# Patient Record
Sex: Female | Born: 1960 | Race: White | Hispanic: No | Marital: Married | Smoking: Former smoker
Health system: Southern US, Community
[De-identification: ages and names within clinical notes are randomized; demographics above are authoritative.]

## PROBLEM LIST (undated history)

## (undated) DIAGNOSIS — F32A Depression, unspecified: Secondary | ICD-10-CM

## (undated) DIAGNOSIS — F329 Major depressive disorder, single episode, unspecified: Secondary | ICD-10-CM

## (undated) DIAGNOSIS — E079 Disorder of thyroid, unspecified: Secondary | ICD-10-CM

## (undated) DIAGNOSIS — E785 Hyperlipidemia, unspecified: Secondary | ICD-10-CM

## (undated) HISTORY — DX: Hyperlipidemia, unspecified: E78.5

## (undated) HISTORY — PX: THYROID SURGERY: SHX805

## (undated) HISTORY — PX: BACK SURGERY: SHX140

## (undated) HISTORY — DX: Depression, unspecified: F32.A

## (undated) HISTORY — DX: Disorder of thyroid, unspecified: E07.9

## (undated) HISTORY — PX: ABDOMINAL HYSTERECTOMY: SHX81

---
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
## 1998-11-01 ENCOUNTER — Encounter: Payer: Self-pay | Admitting: Neurosurgery

## 1998-11-08 ENCOUNTER — Observation Stay (HOSPITAL_COMMUNITY): Admission: RE | Admit: 1998-11-08 | Discharge: 1998-11-08 | Payer: Self-pay | Admitting: Neurosurgery

## 1998-11-08 ENCOUNTER — Encounter: Payer: Self-pay | Admitting: Neurosurgery

## 2016-12-05 ENCOUNTER — Ambulatory Visit (INDEPENDENT_AMBULATORY_CARE_PROVIDER_SITE_OTHER): Payer: BC Managed Care – PPO | Admitting: Physician Assistant

## 2016-12-05 ENCOUNTER — Encounter: Payer: Self-pay | Admitting: Physician Assistant

## 2016-12-05 VITALS — BP 127/68 | HR 67 | Temp 98.0°F | Ht 60.5 in | Wt 216.4 lb

## 2016-12-05 DIAGNOSIS — Z8619 Personal history of other infectious and parasitic diseases: Secondary | ICD-10-CM

## 2016-12-05 DIAGNOSIS — M5136 Other intervertebral disc degeneration, lumbar region: Secondary | ICD-10-CM | POA: Diagnosis not present

## 2016-12-05 DIAGNOSIS — R350 Frequency of micturition: Secondary | ICD-10-CM

## 2016-12-05 DIAGNOSIS — M51369 Other intervertebral disc degeneration, lumbar region without mention of lumbar back pain or lower extremity pain: Secondary | ICD-10-CM | POA: Insufficient documentation

## 2016-12-05 DIAGNOSIS — E7800 Pure hypercholesterolemia, unspecified: Secondary | ICD-10-CM | POA: Diagnosis not present

## 2016-12-05 DIAGNOSIS — E039 Hypothyroidism, unspecified: Secondary | ICD-10-CM | POA: Diagnosis not present

## 2016-12-05 LAB — URINALYSIS, COMPLETE
Bilirubin, UA: NEGATIVE
Glucose, UA: NEGATIVE
Ketones, UA: NEGATIVE
Leukocytes, UA: NEGATIVE
Nitrite, UA: NEGATIVE
Protein, UA: NEGATIVE
Specific Gravity, UA: 1.025 (ref 1.005–1.030)
Urobilinogen, Ur: 0.2 mg/dL (ref 0.2–1.0)
pH, UA: 5.5 (ref 5.0–7.5)

## 2016-12-05 LAB — MICROSCOPIC EXAMINATION
BACTERIA UA: NONE SEEN
Renal Epithel, UA: NONE SEEN /hpf

## 2016-12-05 MED ORDER — GABAPENTIN 100 MG PO CAPS: 100.0000 mg | ORAL_CAPSULE | Freq: Every day | ORAL | 3 refills | Status: DC

## 2016-12-05 MED ORDER — TRIAMCINOLONE ACETONIDE 40 MG/ML IJ SUSP
40.0000 mg | Freq: Once | INTRAMUSCULAR | Status: AC
  Administered 2016-12-05: 40 mg via INTRAMUSCULAR

## 2016-12-05 MED ORDER — METHYLPREDNISOLONE ACETATE 80 MG/ML IJ SUSP: 80.0000 mg | Freq: Once | INTRAMUSCULAR | Status: DC

## 2016-12-05 MED ORDER — CYCLOBENZAPRINE HCL 10 MG PO TABS: 10.0000 mg | ORAL_TABLET | Freq: Three times a day (TID) | ORAL | 0 refills | Status: DC | PRN

## 2016-12-05 NOTE — Progress Notes (Signed)
BP 127/68   Pulse 67   Temp 98 F (36.7 C) (Oral)   Ht 5' 0.5" (1.537 m)   Wt 216 lb 6.4 oz (98.2 kg)   BMI 41.57 kg/m    Subjective:    Patient ID: Deborah Bond, female    DOB: Apr 09, 1960, 56 y.o.   MRN: [id number]  HPI: Deborah Bond is a 56 y.o. female presenting on 12/05/2016 for New Patient (Initial Visit); Back Pain; Urinary Frequency; and Labs Only (Thyroid and cholesterol check )  This patient comes in today to be established as a new patient. She is with a past history of degenerative disc disease with surgeries. She experienced a postsurgical complication of staph in her disc. She does not ever want to have it surgery again if at all possible. She knows she has continued issues with her back. Her past medical history is also positive for hypothyroidism, hyperlipidemia. She is having a flare of her back and has not been on gabapentin in some time. She is choosing not to have any type of narcotic medication. She has been to her gynecologist for evaluation.  She did have some blood going to the bathroom and would like to have her urine checked today. Relevant past medical, surgical, family and social history reviewed and updated as indicated. Allergies and medications reviewed and updated.  Past Medical History:  Diagnosis Date  . Hyperlipidemia   . Thyroid disease     Past Surgical History:  Procedure Laterality Date  . ABDOMINAL HYSTERECTOMY    . BACK SURGERY    . THYROID SURGERY      Review of Systems  Constitutional: Negative.  Negative for activity change, fatigue and fever.  HENT: Negative.   Eyes: Negative.   Respiratory: Negative.  Negative for cough.   Cardiovascular: Negative.  Negative for chest pain.  Gastrointestinal: Negative.  Negative for abdominal pain.  Endocrine: Negative.   Genitourinary: Positive for hematuria. Negative for difficulty urinating, dyspareunia, dysuria and flank pain.  Musculoskeletal: Positive for arthralgias, back  pain, gait problem and myalgias.  Skin: Negative.     Allergies as of 12/05/2016   No Known Allergies     Medication List       Accurate as of 12/05/16  9:36 PM. Always use your most recent med list.          atorvastatin 20 MG tablet Commonly known as:  LIPITOR Take 20 mg by mouth daily.   CALCIUM 600+D 600-200 MG-UNIT Tabs Generic drug:  Calcium Carbonate-Vitamin D Take by mouth.   cyclobenzaprine 10 MG tablet Commonly known as:  FLEXERIL Take 1 tablet (10 mg total) by mouth 3 (three) times daily as needed for muscle spasms.   escitalopram 10 MG tablet Commonly known as:  LEXAPRO Take 10 mg by mouth daily.   gabapentin 100 MG capsule Commonly known as:  NEURONTIN Take 1-3 capsules (100-300 mg total) by mouth at bedtime.   ibuprofen 800 MG tablet Commonly known as:  ADVIL,MOTRIN Take 800 mg by mouth every 8 (eight) hours as needed.   levothyroxine 125 MCG tablet Commonly known as:  SYNTHROID, LEVOTHROID Take 125 mcg by mouth daily before breakfast.   UNABLE TO FIND Med Name: [facility] [place] [facility]            Discharge Care Instructions        Start     Ordered   12/05/16 1300  TRIAMCINOLONE ACETONIDE 40 MG/ML IJ SUSP   Once     12/05/16  1247   12/05/16 0000  Urinalysis, Complete     12/05/16 1218   12/05/16 0000  Urine Culture     12/05/16 1218   12/05/16 0000  gabapentin (NEURONTIN) 100 MG capsule  Daily at bedtime    Question:  Supervising Provider  Answer:  Timmothy Euler   12/05/16 1243   12/05/16 0000  cyclobenzaprine (FLEXERIL) 10 MG tablet  3 times daily PRN    Question:  Supervising Provider  Answer:  Timmothy Euler   12/05/16 1243   12/05/16 0000  CMP14+EGFR     12/05/16 1250   12/05/16 0000  Lipid panel     12/05/16 1250   12/05/16 0000  TSH     12/05/16 1250   12/05/16 0000  Microscopic Examination     12/05/16 0000         Objective:    BP 127/68   Pulse 67   Temp 98 F (36.7 C) (Oral)   Ht 5' 0.5" (1.537 m)   Wt 216  lb 6.4 oz (98.2 kg)   BMI 41.57 kg/m   No Known Allergies  Physical Exam  Constitutional: She is oriented to person, place, and time. She appears well-developed and well-nourished.  HENT:  Head: Normocephalic and atraumatic.  Right Ear: Tympanic membrane, external ear and ear canal normal.  Left Ear: Tympanic membrane, external ear and ear canal normal.  Nose: Nose normal. No rhinorrhea.  Mouth/Throat: Oropharynx is clear and moist and mucous membranes are normal. No oropharyngeal exudate or posterior oropharyngeal erythema.  Eyes: Pupils are equal, round, and reactive to light. Conjunctivae and EOM are normal.  Neck: Normal range of motion. Neck supple.  Cardiovascular: Normal rate, regular rhythm, normal heart sounds and intact distal pulses.   Pulmonary/Chest: Effort normal and breath sounds normal.  Abdominal: Soft. Bowel sounds are normal.  Neurological: She is alert and oriented to person, place, and time. She has normal reflexes.  Skin: Skin is warm and dry. No rash noted.  Psychiatric: She has a normal mood and affect. Her behavior is normal. Judgment and thought content normal.    Results for orders placed or performed in visit on 12/05/16  Microscopic Examination  Result Value Ref Range   WBC, UA 0-5 0 - 5 /hpf   RBC, UA 0-2 0 - 2 /hpf   Epithelial Cells (non renal) 0-10 0 - 10 /hpf   Renal Epithel, UA None seen None seen /hpf   Bacteria, UA None seen None seen/Few  Urinalysis, Complete  Result Value Ref Range   Specific Gravity, UA 1.025 1.005 - 1.030   pH, UA 5.5 5.0 - 7.5   Color, UA Yellow Yellow   Appearance Ur Clear Clear   Leukocytes, UA Negative Negative   Protein, UA Negative Negative/Trace   Glucose, UA Negative Negative   Ketones, UA Negative Negative   RBC, UA Trace (A) Negative   Bilirubin, UA Negative Negative   Urobilinogen, Ur 0.2 0.2 - 1.0 mg/dL   Nitrite, UA Negative Negative   Microscopic Examination See below:       Assessment & Plan:    1. Frequent urination - Urinalysis, Complete - Urine Culture - Microscopic Examination  2. DDD (degenerative disc disease), lumbar - ibuprofen (ADVIL,MOTRIN) 800 MG tablet; Take 800 mg by mouth every 8 (eight) hours as needed. - gabapentin (NEURONTIN) 100 MG capsule; Take 1-3 capsules (100-300 mg total) by mouth at bedtime.  Dispense: 90 capsule; Refill: 3 - cyclobenzaprine (FLEXERIL) 10 MG tablet;  Take 1 tablet (10 mg total) by mouth 3 (three) times daily as needed for muscle spasms.  Dispense: 60 tablet; Refill: 0 - triamcinolone acetonide (KENALOG-40) injection 40 mg; Inject 1 mL (40 mg total) into the muscle once.  3. Acquired hypothyroidism - levothyroxine (SYNTHROID, LEVOTHROID) 125 MCG tablet; Take 125 mcg by mouth daily before breakfast. - CMP14+EGFR - TSH  4. Pure hypercholesterolemia - atorvastatin (LIPITOR) 20 MG tablet; Take 20 mg by mouth daily. - CMP14+EGFR - Lipid panel  5. History of staphylococcal infection    Current Outpatient Prescriptions:  .  atorvastatin (LIPITOR) 20 MG tablet, Take 20 mg by mouth daily., Disp: , Rfl:  .  Calcium Carbonate-Vitamin D (CALCIUM 600+D) 600-200 MG-UNIT TABS, Take by mouth., Disp: , Rfl:  .  escitalopram (LEXAPRO) 10 MG tablet, Take 10 mg by mouth daily., Disp: , Rfl:  .  ibuprofen (ADVIL,MOTRIN) 800 MG tablet, Take 800 mg by mouth every 8 (eight) hours as needed., Disp: , Rfl:  .  levothyroxine (SYNTHROID, LEVOTHROID) 125 MCG tablet, Take 125 mcg by mouth daily before breakfast., Disp: , Rfl:  .  UNABLE TO FIND, Med Name: [facility], Disp: , Rfl:  .  cyclobenzaprine (FLEXERIL) 10 MG tablet, Take 1 tablet (10 mg total) by mouth 3 (three) times daily as needed for muscle spasms., Disp: 60 tablet, Rfl: 0 .  gabapentin (NEURONTIN) 100 MG capsule, Take 1-3 capsules (100-300 mg total) by mouth at bedtime., Disp: 90 capsule, Rfl: 3 Continue all other maintenance medications as listed above.  Follow up plan: Return in about 4  weeks (around 01/02/2017) for recheck.  Educational handout given for [place] PA-C [place] [address]  [place], [place] [phone]   12/05/2016, 9:36 PM

## 2016-12-05 NOTE — Patient Instructions (Signed)
In a few days you may receive a survey in the mail or online from [employer] regarding your visit with us today. Please take a moment to fill this out. Your feedback is very important to our whole office. It can help us better understand your needs as well as improve your experience and satisfaction. Thank you for taking your time to complete it. We care about you.  Raenette Sakata, PA-C  

## 2016-12-06 LAB — LIPID PANEL
CHOL/HDL RATIO: 4 ratio (ref 0.0–4.4)
CHOLESTEROL TOTAL: 206 mg/dL — AB (ref 100–199)
HDL: 51 mg/dL (ref >39)
LDL Calculated: 110 mg/dL — ABNORMAL HIGH (ref 0–99)
TRIGLYCERIDES: 223 mg/dL — AB (ref 0–149)
VLDL Cholesterol Cal: 45 mg/dL — ABNORMAL HIGH (ref 5–40)

## 2016-12-06 LAB — CMP14+EGFR
A/G RATIO: 1.9 (ref 1.2–2.2)
ALT: 16 IU/L (ref 0–32)
AST: 21 IU/L (ref 0–40)
Albumin: 4.5 g/dL (ref 3.5–5.5)
Alkaline Phosphatase: 117 IU/L (ref 39–117)
BUN/Creatinine Ratio: 29 — ABNORMAL HIGH (ref 9–23)
BUN: 24 mg/dL (ref 6–24)
Bilirubin Total: <0.2 mg/dL (ref 0.0–1.2)
CALCIUM: 9.6 mg/dL (ref 8.7–10.2)
CO2: 22 mmol/L (ref 20–29)
CREATININE: 0.83 mg/dL (ref 0.57–1.00)
Chloride: 102 mmol/L (ref 96–106)
GFR calc Af Amer: 91 mL/min/{1.73_m2} (ref >59)
GFR, EST NON AFRICAN AMERICAN: 79 mL/min/{1.73_m2} (ref >59)
GLOBULIN, TOTAL: 2.4 g/dL (ref 1.5–4.5)
Glucose: 91 mg/dL (ref 65–99)
POTASSIUM: 4.4 mmol/L (ref 3.5–5.2)
SODIUM: 141 mmol/L (ref 134–144)
Total Protein: 6.9 g/dL (ref 6.0–8.5)

## 2016-12-06 LAB — TSH: TSH: 3.45 u[IU]/mL (ref 0.450–4.500)

## 2016-12-06 LAB — URINE CULTURE

## 2017-01-07 ENCOUNTER — Encounter: Payer: Self-pay | Admitting: Physician Assistant

## 2017-01-07 ENCOUNTER — Ambulatory Visit (INDEPENDENT_AMBULATORY_CARE_PROVIDER_SITE_OTHER): Payer: BC Managed Care – PPO | Admitting: Physician Assistant

## 2017-01-07 DIAGNOSIS — M5136 Other intervertebral disc degeneration, lumbar region: Secondary | ICD-10-CM | POA: Diagnosis not present

## 2017-01-07 DIAGNOSIS — M51369 Other intervertebral disc degeneration, lumbar region without mention of lumbar back pain or lower extremity pain: Secondary | ICD-10-CM

## 2017-01-07 MED ORDER — GABAPENTIN 300 MG PO CAPS: 300.0000 mg | ORAL_CAPSULE | Freq: Every day | ORAL | 11 refills | Status: DC

## 2017-01-07 NOTE — Progress Notes (Signed)
BP 96/64   Pulse 62   Temp (!) 97.3 F (36.3 C) (Oral)   Ht 5' 0.05" (1.525 m)   Wt 218 lb 9.6 oz (99.2 kg)   BMI 42.62 kg/m    Subjective:    Patient ID: Deborah Bond, female    DOB: Feb 03, 1961, 56 y.o.   MRN: [id number]  HPI: Deborah Bond is a 56 y.o. female presenting on 01/07/2017 for Follow-up (4 week rck )  Patient comes in for recheck on her medications. She was started on gabapentin 1 month ago. She has built up to 300 mg at bedtime. She states she can tell a difference with the pain in her back. She is trying to avoid surgery if at all possible. Tolerate going higher on the medication. We discussed taking anywhere from 300-900 mg. And I will change the dosing. We reviewed her labs today. Everything appeared normal and she can have this rechecked in 1 year.  Relevant past medical, surgical, family and social history reviewed and updated as indicated. Allergies and medications reviewed and updated.  Past Medical History:  Diagnosis Date  . Hyperlipidemia   . Thyroid disease     Past Surgical History:  Procedure Laterality Date  . ABDOMINAL HYSTERECTOMY    . BACK SURGERY    . THYROID SURGERY      Review of Systems  Constitutional: Negative.  Negative for activity change, fatigue and fever.  HENT: Negative.   Eyes: Negative.   Respiratory: Negative.  Negative for cough.   Cardiovascular: Negative.  Negative for chest pain.  Gastrointestinal: Negative.  Negative for abdominal pain.  Endocrine: Negative.   Genitourinary: Negative.  Negative for dysuria.  Musculoskeletal: Positive for arthralgias, back pain and myalgias. Negative for neck pain and neck stiffness.  Skin: Negative.   Neurological: Negative.     Allergies as of 01/07/2017   No Known Allergies     Medication List       Accurate as of 01/07/17  2:04 PM. Always use your most recent med list.          atorvastatin 20 MG tablet Commonly known as:  LIPITOR Take 20 mg by mouth  daily.   CALCIUM 600+D 600-200 MG-UNIT Tabs Generic drug:  Calcium Carbonate-Vitamin D Take by mouth.   cyclobenzaprine 10 MG tablet Commonly known as:  FLEXERIL Take 1 tablet (10 mg total) by mouth 3 (three) times daily as needed for muscle spasms.   escitalopram 10 MG tablet Commonly known as:  LEXAPRO Take 10 mg by mouth daily.   gabapentin 300 MG capsule Commonly known as:  NEURONTIN Take 1-3 capsules (300-900 mg total) by mouth at bedtime.   ibuprofen 800 MG tablet Commonly known as:  ADVIL,MOTRIN Take 800 mg by mouth every 8 (eight) hours as needed.   levothyroxine 125 MCG tablet Commonly known as:  SYNTHROID, LEVOTHROID Take 125 mcg by mouth daily before breakfast.   UNABLE TO FIND Med Name: [facility]          Objective:    BP 96/64   Pulse 62   Temp (!) 97.3 F (36.3 C) (Oral)   Ht 5' 0.05" (1.525 m)   Wt 218 lb 9.6 oz (99.2 kg)   BMI 42.62 kg/m   No Known Allergies  Physical Exam  Constitutional: She is oriented to person, place, and time. She appears well-developed and well-nourished.  HENT:  Head: Normocephalic and atraumatic.  Eyes: Pupils are equal, round, and reactive to light. Conjunctivae and  EOM are normal.  Cardiovascular: Normal rate, regular rhythm, normal heart sounds and intact distal pulses.   Pulmonary/Chest: Effort normal and breath sounds normal.  Abdominal: Soft. Bowel sounds are normal.  Musculoskeletal:       Lumbar back: She exhibits decreased range of motion, tenderness, pain and spasm.  Neurological: She is alert and oriented to person, place, and time. She has normal reflexes.  Skin: Skin is warm and dry. No rash noted.  Psychiatric: She has a normal mood and affect. Her behavior is normal. Judgment and thought content normal.        Assessment & Plan:   1. DDD (degenerative disc disease), lumbar - gabapentin (NEURONTIN) 300 MG capsule; Take 1-3 capsules (300-900 mg total) by mouth at bedtime.  Dispense: 90  capsule; Refill: 11    Current Outpatient Prescriptions:  .  atorvastatin (LIPITOR) 20 MG tablet, Take 20 mg by mouth daily., Disp: , Rfl:  .  Calcium Carbonate-Vitamin D (CALCIUM 600+D) 600-200 MG-UNIT TABS, Take by mouth., Disp: , Rfl:  .  cyclobenzaprine (FLEXERIL) 10 MG tablet, Take 1 tablet (10 mg total) by mouth 3 (three) times daily as needed for muscle spasms., Disp: 60 tablet, Rfl: 0 .  escitalopram (LEXAPRO) 10 MG tablet, Take 10 mg by mouth daily., Disp: , Rfl:  .  gabapentin (NEURONTIN) 300 MG capsule, Take 1-3 capsules (300-900 mg total) by mouth at bedtime., Disp: 90 capsule, Rfl: 11 .  ibuprofen (ADVIL,MOTRIN) 800 MG tablet, Take 800 mg by mouth every 8 (eight) hours as needed., Disp: , Rfl:  .  levothyroxine (SYNTHROID, LEVOTHROID) 125 MCG tablet, Take 125 mcg by mouth daily before breakfast., Disp: , Rfl:  .  UNABLE TO FIND, Med Name: [facility], Disp: , Rfl:  Continue all other maintenance medications as listed above.  Follow up plan: Return in about 6 months (around 07/08/2017) for recheck.  Educational handout given for [place]ye  Remus Loffler PA-C [facility] [address]  [place], [place] [address] [phone]   01/07/2017, 2:04 PM

## 2017-01-07 NOTE — Patient Instructions (Signed)
In a few days you may receive a survey in the mail or online from [employer] regarding your visit with us today. Please take a moment to fill this out. Your feedback is very important to our whole office. It can help us better understand your needs as well as improve your experience and satisfaction. Thank you for taking your time to complete it. We care about you.  Crystol Walpole, PA-C  

## 2017-04-25 ENCOUNTER — Encounter: Payer: Self-pay | Admitting: Physician Assistant

## 2017-04-25 ENCOUNTER — Ambulatory Visit: Payer: BC Managed Care – PPO | Admitting: Physician Assistant

## 2017-04-25 VITALS — BP 136/75 | HR 74 | Temp 96.8°F | Ht 60.0 in | Wt 238.0 lb

## 2017-04-25 DIAGNOSIS — M778 Other enthesopathies, not elsewhere classified: Secondary | ICD-10-CM | POA: Diagnosis not present

## 2017-04-25 DIAGNOSIS — M654 Radial styloid tenosynovitis [de Quervain]: Secondary | ICD-10-CM

## 2017-04-25 MED ORDER — DICLOFENAC SODIUM 1 % TD GEL: 4.0000 g | Freq: Four times a day (QID) | TRANSDERMAL | 11 refills | Status: DC

## 2017-04-25 MED ORDER — DICLOFENAC SODIUM 75 MG PO TBEC: 75.0000 mg | DELAYED_RELEASE_TABLET | Freq: Two times a day (BID) | ORAL | 0 refills | Status: DC

## 2017-04-25 MED ORDER — METHYLPREDNISOLONE ACETATE 80 MG/ML IJ SUSP
80.0000 mg | Freq: Once | INTRAMUSCULAR | Status: AC
  Administered 2017-04-25: 80 mg via INTRAMUSCULAR

## 2017-04-25 MED ORDER — HYDROCODONE-ACETAMINOPHEN 10-325 MG PO TABS: 1.0000 | ORAL_TABLET | Freq: Four times a day (QID) | ORAL | 0 refills | Status: DC | PRN

## 2017-04-25 NOTE — Patient Instructions (Signed)
De Quervain Disease De Quervain disease is inflammation of the tendon on the thumb side of the wrist. Tendons are cords of tissue that connect bones to muscles. The tendons in your hand pass through a tunnel, or sheath. A slippery layer of tissue (synovium) lets the tendons move smoothly in the sheath. With de Quervain disease, the sheath swells or thickens, causing friction and pain. The condition is also called de Quervain tendinosis and de Quervain syndrome. It occurs most often in women who are 30-50 years old. What are the causes? The exact cause of de Quervain disease is not known. It may result from:  Overusing your hands, especially with repetitive motions that involve twisting your hand or using a forceful grip.  Pregnancy.  Rheumatoid disease.  What increases the risk? You may have a greater risk for de Quervain disease if you:  Are a middle-aged woman.  Are pregnant.  Have rheumatoid arthritis.  Have diabetes.  Use your hands far more than normal, especially with a tight grip or excessive twisting.  What are the signs or symptoms? Pain on the thumb side of your wrist is the main symptom of de Quervain disease. Other signs and symptoms include:  Pain that gets worse when you grasp something or turn your wrist.  Pain that extends up the forearm.  Cysts in the area of the pain.  Swelling of your wrist and hand.  A sensation of snapping in the wrist.  Trouble moving the thumb and wrist.  How is this diagnosed? Your health care provider may diagnose de Quervain disease based on your signs and symptoms. A physical exam will also be done. A simple test (Finkelstein test) that involves pulling your thumb and wrist to see if this causes pain can help determine whether you have the condition. Sometimes you may need to have an X-ray. How is this treated? Avoiding any activity that causes pain and swelling is the best treatment. Other options include:  Wearing a  splint.  Taking medicine. Anti-inflammatory medicines and corticosteroid injections may reduce inflammation and relieve pain.  Having surgery if other treatments do not work.  Follow these instructions at home:  Using ice can be helpful after doing activities that involve the sore wrist. To apply ice to the injured area: ? Put ice in a plastic bag. ? Place a towel between your skin and the bag. ? Leave the ice on for 20 minutes, 2-3 times a day.  Take medicines only as directed by your health care provider.  Wear your splint as directed. This will allow your hand to rest and heal. Contact a health care provider if:  Your pain medicine does not help.  Your pain gets worse.  You develop new symptoms. This information is not intended to replace advice given to you by your health care provider. Make sure you discuss any questions you have with your health care provider. Document Released: 12/05/2000 Document Revised: 08/18/2015 Document Reviewed: 07/15/2013 Elsevier Interactive Patient Education  2018 Elsevier Inc.  

## 2017-04-25 NOTE — Progress Notes (Signed)
BP 136/75   Pulse 74   Temp (!) 96.8 F (36 C) (Oral)   Ht 5' (1.524 m)   Wt 238 lb (108 kg)   BMI 46.48 kg/m    Subjective:    Patient ID: Deborah Bond, female    DOB: 03-09-1961, 57 y.o.   MRN: [id number]  HPI: Deborah Bond is a 57 y.o. female presenting on 04/25/2017 for Hand Pain (Left No injury)  Had several days of left wrist pain and swelling. She was unable to sleep last night.  She has no known injury. She works on a computer most of the day.  No old injury.  Had a wrist splint from carpal tunnel but made no improvement.    Relevant past medical, surgical, family and social history reviewed and updated as indicated. Allergies and medications reviewed and updated.  Past Medical History:  Diagnosis Date  . Hyperlipidemia   . Thyroid disease     Past Surgical History:  Procedure Laterality Date  . ABDOMINAL HYSTERECTOMY    . BACK SURGERY    . THYROID SURGERY      Review of Systems  Constitutional: Negative.   HENT: Negative.   Eyes: Negative.   Respiratory: Negative.   Gastrointestinal: Negative.   Genitourinary: Negative.   Musculoskeletal: Positive for joint swelling and myalgias.    Allergies as of 04/25/2017   No Known Allergies     Medication List        Accurate as of 04/25/17 11:32 AM. Always use your most recent med list.          atorvastatin 20 MG tablet Commonly known as:  LIPITOR Take 20 mg by mouth daily.   CALCIUM 600+D 600-200 MG-UNIT Tabs Generic drug:  Calcium Carbonate-Vitamin D Take by mouth.   cyclobenzaprine 10 MG tablet Commonly known as:  FLEXERIL Take 1 tablet (10 mg total) by mouth 3 (three) times daily as needed for muscle spasms.   diclofenac 75 MG EC tablet Commonly known as:  VOLTAREN Take 1 tablet (75 mg total) by mouth 2 (two) times daily.   diclofenac sodium 1 % Gel Commonly known as:  VOLTAREN Apply 4 g topically 4 (four) times daily.   escitalopram 10 MG tablet Commonly known as:   LEXAPRO Take 10 mg by mouth daily.   gabapentin 300 MG capsule Commonly known as:  NEURONTIN Take 1-3 capsules (300-900 mg total) by mouth at bedtime.   HYDROcodone-acetaminophen 10-325 MG tablet Commonly known as:  NORCO Take 1 tablet by mouth every 6 (six) hours as needed.   ibuprofen 800 MG tablet Commonly known as:  ADVIL,MOTRIN Take 800 mg by mouth every 8 (eight) hours as needed.   levothyroxine 125 MCG tablet Commonly known as:  SYNTHROID, LEVOTHROID Take 125 mcg by mouth daily before breakfast.   UNABLE TO FIND Med Name: [facility]          Objective:    BP 136/75   Pulse 74   Temp (!) 96.8 F (36 C) (Oral)   Ht 5' (1.524 m)   Wt 238 lb (108 kg)   BMI 46.48 kg/m   No Known Allergies  Physical Exam  Constitutional: She is oriented to person, place, and time. She appears well-developed and well-nourished.  HENT:  Head: Normocephalic and atraumatic.  Eyes: Conjunctivae and EOM are normal. Pupils are equal, round, and reactive to light.  Cardiovascular: Normal rate, regular rhythm, normal heart sounds and intact distal pulses.  Pulmonary/Chest: Effort normal and  breath sounds normal.  Abdominal: Soft. Bowel sounds are normal.  Musculoskeletal:       Left hand: She exhibits tenderness and swelling. Normal sensation noted. Normal strength noted.       Hands: Neurological: She is alert and oriented to person, place, and time. She has normal reflexes.  Skin: Skin is warm and dry. No rash noted.  Psychiatric: She has a normal mood and affect. Her behavior is normal. Judgment and thought content normal.        Assessment & Plan:   1. Tendonitis of wrist, left - methylPREDNISolone acetate (DEPO-MEDROL) injection 80 mg  2. De Quervain's disease (tenosynovitis) - methylPREDNISolone acetate (DEPO-MEDROL) injection 80 mg - diclofenac (VOLTAREN) 75 MG EC tablet; Take 1 tablet (75 mg total) by mouth 2 (two) times daily.  Dispense: 30 tablet; Refill: 0 -  diclofenac sodium (VOLTAREN) 1 % GEL; Apply 4 g topically 4 (four) times daily.  Dispense: 100 g; Refill: 11 - HYDROcodone-acetaminophen (NORCO) 10-325 MG tablet; Take 1 tablet by mouth every 6 (six) hours as needed.  Dispense: 40 tablet; Refill: 0    Current Outpatient Medications:  .  atorvastatin (LIPITOR) 20 MG tablet, Take 20 mg by mouth daily., Disp: , Rfl:  .  Calcium Carbonate-Vitamin D (CALCIUM 600+D) 600-200 MG-UNIT TABS, Take by mouth., Disp: , Rfl:  .  cyclobenzaprine (FLEXERIL) 10 MG tablet, Take 1 tablet (10 mg total) by mouth 3 (three) times daily as needed for muscle spasms., Disp: 60 tablet, Rfl: 0 .  diclofenac (VOLTAREN) 75 MG EC tablet, Take 1 tablet (75 mg total) by mouth 2 (two) times daily., Disp: 30 tablet, Rfl: 0 .  diclofenac sodium (VOLTAREN) 1 % GEL, Apply 4 g topically 4 (four) times daily., Disp: 100 g, Rfl: 11 .  escitalopram (LEXAPRO) 10 MG tablet, Take 10 mg by mouth daily., Disp: , Rfl:  .  gabapentin (NEURONTIN) 300 MG capsule, Take 1-3 capsules (300-900 mg total) by mouth at bedtime., Disp: 90 capsule, Rfl: 11 .  HYDROcodone-acetaminophen (NORCO) 10-325 MG tablet, Take 1 tablet by mouth every 6 (six) hours as needed., Disp: 40 tablet, Rfl: 0 .  ibuprofen (ADVIL,MOTRIN) 800 MG tablet, Take 800 mg by mouth every 8 (eight) hours as needed., Disp: , Rfl:  .  levothyroxine (SYNTHROID, LEVOTHROID) 125 MCG tablet, Take 125 mcg by mouth daily before breakfast., Disp: , Rfl:  .  UNABLE TO FIND, [facility], Disp: , Rfl:   Current Facility-Administered Medications:  .  methylPREDNISolone acetate (DEPO-MEDROL) injection 80 mg, 80 mg, Intramuscular, Once, Colbi Staubs S, PA-C Continue all other maintenance medications as listed above.  Follow up plan: Return if symptoms worsen or fail to improve.  Educational handout given for [employer]  Remus LofflerAngel S. Rachel Samples PA-C [facility] [address]  [place], [place]  [id number] [phone]   04/25/2017, 11:32 AM

## 2017-04-30 ENCOUNTER — Telehealth: Payer: Self-pay

## 2017-04-30 NOTE — Telephone Encounter (Signed)
Insurance denied Diclofenac

## 2017-04-30 NOTE — Telephone Encounter (Signed)
Is there a list of what is covered?

## 2017-05-01 ENCOUNTER — Other Ambulatory Visit: Payer: Self-pay | Admitting: Physician Assistant

## 2017-05-01 ENCOUNTER — Telehealth: Payer: Self-pay

## 2017-05-01 MED ORDER — MELOXICAM 7.5 MG PO TABS: 7.5000 mg | ORAL_TABLET | Freq: Every day | ORAL | 2 refills | Status: DC

## 2017-05-01 NOTE — Telephone Encounter (Signed)
Just got a denial off Cover My Meds  Still might get a paper denial that should say

## 2017-05-01 NOTE — Telephone Encounter (Signed)
Insurance denied Diclofenac gel   Needs to try oral NSAIDS

## 2017-05-01 NOTE — Telephone Encounter (Signed)
I cancelled it in the list and sent meloxicam.  Even if it is not covered it is really inexpensive.

## 2017-05-01 NOTE — Telephone Encounter (Signed)
Sounds typical, I sent oral mobic already

## 2017-05-06 ENCOUNTER — Other Ambulatory Visit: Payer: Self-pay | Admitting: *Deleted

## 2017-05-06 ENCOUNTER — Telehealth: Payer: Self-pay | Admitting: Physician Assistant

## 2017-05-06 NOTE — Telephone Encounter (Signed)
On 05/01/17 I sent oral meloxicam, is it there?

## 2017-05-06 NOTE — Telephone Encounter (Signed)
Patient had not ask for voltaren gel so script was discontinued.

## 2017-05-06 NOTE — Telephone Encounter (Signed)
Left message to call.

## 2017-06-25 ENCOUNTER — Ambulatory Visit: Payer: BC Managed Care – PPO | Admitting: Physician Assistant

## 2017-06-25 ENCOUNTER — Encounter: Payer: Self-pay | Admitting: Physician Assistant

## 2017-06-25 VITALS — BP 109/67 | HR 70 | Temp 97.0°F | Ht 60.0 in | Wt 236.6 lb

## 2017-06-25 DIAGNOSIS — N644 Mastodynia: Secondary | ICD-10-CM | POA: Diagnosis not present

## 2017-06-25 DIAGNOSIS — Z1231 Encounter for screening mammogram for malignant neoplasm of breast: Secondary | ICD-10-CM | POA: Diagnosis not present

## 2017-06-25 DIAGNOSIS — F321 Major depressive disorder, single episode, moderate: Secondary | ICD-10-CM | POA: Diagnosis not present

## 2017-06-25 DIAGNOSIS — Z1239 Encounter for other screening for malignant neoplasm of breast: Secondary | ICD-10-CM

## 2017-06-25 MED ORDER — ESCITALOPRAM OXALATE 20 MG PO TABS: 20.0000 mg | ORAL_TABLET | Freq: Every day | ORAL | 3 refills | Status: DC

## 2017-06-25 NOTE — Progress Notes (Signed)
BP 109/67   Pulse 70   Temp (!) 97 F (36.1 C) (Oral)   Ht 5' (1.524 m)   Wt 236 lb 9.6 oz (107.3 kg)   BMI 46.21 kg/m    Subjective:    Patient ID: Deborah Bond, female    DOB: [birth year], 57 y.o.   MRN: [id number]  HPI: Deborah Bond is a 57 y.o. female presenting on 06/25/2017 for Breast Pain (left ) Patient with left lateral breast pain, without swelling or skin changes.  She has never had pain in this area before.  She does not know of any injury she has done to it.  2 weeks ago she did have a severe gastroenteritis and had a lot of vomiting.  But she states the pain issues have been in the last couple days.  She has a significant amount of stress going on related to her job.  She has just about 2-1/2 years until she retires.  She is not able to leave from the position she is in.  Some time ago she had a cyst in her right breast.  She does have a family history of breast cancer.   Past Medical History:  Diagnosis Date  . Hyperlipidemia   . Thyroid disease    Relevant past medical, surgical, family and social history reviewed and updated as indicated. Interim medical history since our last visit reviewed. Allergies and medications reviewed and updated. DATA REVIEWED: CHART IN EPIC  Family History reviewed for pertinent findings.  Review of Systems  Constitutional: Negative.   HENT: Negative.   Eyes: Negative.   Respiratory: Negative.   Gastrointestinal: Negative.   Genitourinary: Negative.   Psychiatric/Behavioral: Positive for agitation, decreased concentration and dysphoric mood. The patient is nervous/anxious.     Allergies as of 06/25/2017   No Known Allergies     Medication List        Accurate as of 06/25/17  5:00 PM. Always use your most recent med list.          atorvastatin 20 MG tablet Commonly known as:  LIPITOR Take 20 mg by mouth daily.   CALCIUM 600+D 600-200 MG-UNIT Tabs Generic drug:  Calcium Carbonate-Vitamin D Take by mouth.     cyclobenzaprine 10 MG tablet Commonly known as:  FLEXERIL Take 1 tablet (10 mg total) by mouth 3 (three) times daily as needed for muscle spasms.   escitalopram 20 MG tablet Commonly known as:  LEXAPRO Take 1 tablet (20 mg total) by mouth daily.   gabapentin 300 MG capsule Commonly known as:  NEURONTIN Take 1-3 capsules (300-900 mg total) by mouth at bedtime.   ibuprofen 800 MG tablet Commonly known as:  ADVIL,MOTRIN Take 800 mg by mouth every 8 (eight) hours as needed.   levothyroxine 125 MCG tablet Commonly known as:  SYNTHROID, LEVOTHROID Take 125 mcg by mouth daily before breakfast.   meloxicam 7.5 MG tablet Commonly known as:  MOBIC Take 1 tablet (7.5 mg total) by mouth daily.   UNABLE TO FIND Med Name: [facility]ne Complete          Objective:    BP 109/67   Pulse 70   Temp (!) 97 F (36.1 C) (Oral)   Ht 5' (1.524 m)   Wt 236 lb 9.6 oz (107.3 kg)   BMI 46.21 kg/m   No Known Allergies  Wt Readings from Last 3 Encounters:  06/25/17 236 lb 9.6 oz (107.3 kg)  04/25/17 238 lb (108 kg)  01/07/17 218  lb 9.6 oz (99.2 kg)    Physical Exam  Constitutional: She is oriented to person, place, and time. She appears well-developed and well-nourished.  HENT:  Head: Normocephalic and atraumatic.  Eyes: Pupils are equal, round, and reactive to light. Conjunctivae and EOM are normal.  Cardiovascular: Normal rate, regular rhythm, normal heart sounds and intact distal pulses.  Pulmonary/Chest: Effort normal and breath sounds normal. Right breast exhibits no inverted nipple, no mass, no nipple discharge, no skin change and no tenderness. Left breast exhibits tenderness. Left breast exhibits no inverted nipple, no mass, no nipple discharge and no skin change. Breasts are symmetrical.    Abdominal: Soft. Bowel sounds are normal.  Musculoskeletal: She exhibits no tenderness or deformity.  Neurological: She is alert and oriented to person, place, and time. She has normal reflexes.   Skin: Skin is warm and dry. No rash noted.  Psychiatric: She has a normal mood and affect. Her behavior is normal. Judgment and thought content normal.  Nursing note and vitals reviewed.       Assessment & Plan:   1. Breast pain - MM Digital Screening; Future  2. Screening for breast cancer  - MM Digital Screening; Future  3. Depression, major, single episode, moderate (HCC) - escitalopram (LEXAPRO) 20 MG tablet; Take 1 tablet (20 mg total) by mouth daily.  Dispense: 90 tablet; Refill: 3   Continue all other maintenance medications as listed above.  Follow up plan: No follow-ups on file.  Educational handout given for survey  Remus LofflerAngel S. Ramie Erman PA-C [facility] [address]  [place], [place] [id number] [phone]   06/25/2017, 5:00 PM

## 2017-07-08 ENCOUNTER — Other Ambulatory Visit: Payer: Self-pay | Admitting: Physician Assistant

## 2017-07-08 DIAGNOSIS — Z1239 Encounter for other screening for malignant neoplasm of breast: Secondary | ICD-10-CM

## 2017-07-08 DIAGNOSIS — N644 Mastodynia: Secondary | ICD-10-CM

## 2017-07-22 ENCOUNTER — Ambulatory Visit: Payer: Self-pay

## 2017-07-22 ENCOUNTER — Ambulatory Visit
Admission: RE | Admit: 2017-07-22 | Discharge: 2017-07-22 | Disposition: A | Discharge status: Home / Self Care | Payer: BC Managed Care – PPO | Source: Ambulatory Visit | Attending: Physician Assistant | Admitting: Physician Assistant

## 2017-07-22 DIAGNOSIS — Z1239 Encounter for other screening for malignant neoplasm of breast: Secondary | ICD-10-CM

## 2017-07-22 DIAGNOSIS — N644 Mastodynia: Secondary | ICD-10-CM

## 2017-08-27 ENCOUNTER — Other Ambulatory Visit: Payer: Self-pay | Admitting: Physician Assistant

## 2017-08-27 DIAGNOSIS — Z1231 Encounter for screening mammogram for malignant neoplasm of breast: Secondary | ICD-10-CM

## 2017-09-16 ENCOUNTER — Encounter (HOSPITAL_COMMUNITY): Payer: Self-pay

## 2017-09-16 ENCOUNTER — Ambulatory Visit (HOSPITAL_COMMUNITY)
Admission: RE | Admit: 2017-09-16 | Discharge: 2017-09-16 | Disposition: A | Discharge status: Home / Self Care | Payer: BC Managed Care – PPO | Source: Ambulatory Visit | Attending: Physician Assistant | Admitting: Physician Assistant

## 2017-09-16 DIAGNOSIS — Z1231 Encounter for screening mammogram for malignant neoplasm of breast: Secondary | ICD-10-CM | POA: Insufficient documentation

## 2017-09-18 ENCOUNTER — Ambulatory Visit: Payer: BC Managed Care – PPO | Admitting: Physician Assistant

## 2017-09-18 ENCOUNTER — Encounter: Payer: Self-pay | Admitting: Physician Assistant

## 2017-09-18 VITALS — BP 130/79 | HR 82 | Temp 97.2°F | Ht 60.0 in | Wt 243.0 lb

## 2017-09-18 DIAGNOSIS — M2550 Pain in unspecified joint: Secondary | ICD-10-CM

## 2017-09-18 DIAGNOSIS — W57XXXS Bitten or stung by nonvenomous insect and other nonvenomous arthropods, sequela: Secondary | ICD-10-CM | POA: Diagnosis not present

## 2017-09-18 DIAGNOSIS — M2560 Stiffness of unspecified joint, not elsewhere classified: Secondary | ICD-10-CM

## 2017-09-18 NOTE — Progress Notes (Signed)
BP 130/79   Pulse 82   Temp (!) 97.2 F (36.2 C) (Oral)   Ht 5' (1.524 m)   Wt 243 lb (110.2 kg)   BMI 47.46 kg/m    Subjective:    Patient ID: Deborah Bond, female    DOB: [birth year], 57 y.o.   MRN: [id number]  HPI: Deborah Bond is a 57 y.o. female presenting on 09/18/2017 for Back Pain (lower )  This patient comes in with a severe amount of pain throughout her body.  It is very bad in the arms and hands.  Also in her feet and toes.  She does have known degenerative disc disease and has had surgeries in the past.  So she understands were some of her midsection pain comes from.  She also is experiencing extreme fatigue and inability to hardly get going.  She does have morning stiffness that will last upwards of an hour.  She has to get into a hot shower to get relief.  She did have recommended spotted fever while she was in her 31s.  And she was treated for this.  She has a sister with rheumatoid arthritis and a brother with gouty arthritis.  She did have a tick on her just a month or so ago and it could have been on her operative week.  It was a small tick.  Past Medical History:  Diagnosis Date  . Hyperlipidemia   . Thyroid disease    Relevant past medical, surgical, family and social history reviewed and updated as indicated. Interim medical history since our last visit reviewed. Allergies and medications reviewed and updated. DATA REVIEWED: CHART IN EPIC  Family History reviewed for pertinent findings.  Review of Systems  Constitutional: Positive for activity change, fatigue and unexpected weight change. Negative for fever.  HENT: Negative.   Eyes: Negative.   Respiratory: Negative.  Negative for cough.   Cardiovascular: Negative.  Negative for chest pain, palpitations and leg swelling.  Gastrointestinal: Negative.  Negative for abdominal pain.  Endocrine: Negative.   Genitourinary: Negative.  Negative for dysuria.  Musculoskeletal: Positive for  arthralgias, back pain, gait problem, joint swelling, myalgias, neck pain and neck stiffness.  Skin: Negative.   Neurological: Positive for weakness. Negative for numbness.  Hematological: Negative.   Psychiatric/Behavioral: Positive for dysphoric mood.    Allergies as of 09/18/2017   No Known Allergies     Medication List        Accurate as of 09/18/17  3:27 PM. Always use your most recent med list.          atorvastatin 20 MG tablet Commonly known as:  LIPITOR Take 20 mg by mouth daily.   CALCIUM 600+D 600-200 MG-UNIT Tabs Generic drug:  Calcium Carbonate-Vitamin D Take by mouth.   cyclobenzaprine 10 MG tablet Commonly known as:  FLEXERIL Take 1 tablet (10 mg total) by mouth 3 (three) times daily as needed for muscle spasms.   escitalopram 20 MG tablet Commonly known as:  LEXAPRO Take 1 tablet (20 mg total) by mouth daily.   gabapentin 300 MG capsule Commonly known as:  NEURONTIN Take 1-3 capsules (300-900 mg total) by mouth at bedtime.   ibuprofen 800 MG tablet Commonly known as:  ADVIL,MOTRIN Take 800 mg by mouth every 8 (eight) hours as needed.   levothyroxine 125 MCG tablet Commonly known as:  SYNTHROID, LEVOTHROID Take 125 mcg by mouth daily before breakfast.   meloxicam 7.5 MG tablet Commonly known as:  MOBIC  Take 1 tablet (7.5 mg total) by mouth daily.   UNABLE TO FIND Med Name: Hervey Ard Complete          Objective:    BP 130/79   Pulse 82   Temp (!) 97.2 F (36.2 C) (Oral)   Ht 5' (1.524 m)   Wt 243 lb (110.2 kg)   BMI 47.46 kg/m   No Known Allergies  Wt Readings from Last 3 Encounters:  09/18/17 243 lb (110.2 kg)  06/25/17 236 lb 9.6 oz (107.3 kg)  04/25/17 238 lb (108 kg)    Physical Exam  Constitutional: She is oriented to person, place, and time. She appears well-developed and well-nourished.  HENT:  Head: Normocephalic and atraumatic.  Eyes: Pupils are equal, round, and reactive to light. Conjunctivae and EOM are normal.    Cardiovascular: Normal rate, regular rhythm, normal heart sounds and intact distal pulses.  Pulmonary/Chest: Effort normal and breath sounds normal.  Abdominal: Soft. Bowel sounds are normal.  Musculoskeletal:       Right wrist: She exhibits tenderness.       Left wrist: She exhibits tenderness.       Right ankle: Tenderness.       Left ankle: Tenderness.       Lumbar back: She exhibits tenderness.  Neurological: She is alert and oriented to person, place, and time. She has normal reflexes.  Skin: Skin is warm and dry. No rash noted.  Psychiatric: She has a normal mood and affect. Her behavior is normal. Judgment and thought content normal.        Assessment & Plan:   1. Arthralgia, unspecified joint - CBC with Differential/Platelet - CMP14+EGFR - TSH - Uric acid - Alpha-Gal Panel - Arthritis Panel - ANA,IFA RA Diag Pnl w/rflx Tit/Patn - Lyme Ab/Western Blot Reflex - Rocky mtn spotted fvr abs pnl(IgG+IgM)  2. Stiffness in joint - CBC with Differential/Platelet - CMP14+EGFR - TSH - Uric acid - Alpha-Gal Panel - Arthritis Panel - ANA,IFA RA Diag Pnl w/rflx Tit/Patn - Lyme Ab/Western Blot Reflex - Rocky mtn spotted fvr abs pnl(IgG+IgM)  3. Tick bite, sequela - Lyme Ab/Western Blot Reflex - Rocky mtn spotted fvr abs pnl(IgG+IgM)   Continue all other maintenance medications as listed above.  Follow up plan: No follow-ups on file.  Educational handout given for Alex PA-C [facility] [address]  [place], [place] [address] [phone]   09/18/2017, 3:27 PM

## 2017-09-20 ENCOUNTER — Other Ambulatory Visit: Payer: Self-pay | Admitting: *Deleted

## 2017-09-20 DIAGNOSIS — M2560 Stiffness of unspecified joint, not elsewhere classified: Secondary | ICD-10-CM

## 2017-09-20 DIAGNOSIS — M2550 Pain in unspecified joint: Secondary | ICD-10-CM

## 2017-09-23 LAB — ARTHRITIS PANEL
BASOS: 1 %
Basophils Absolute: 0.1 10*3/uL (ref 0.0–0.2)
EOS (ABSOLUTE): 0.2 10*3/uL (ref 0.0–0.4)
EOS: 3 %
HEMATOCRIT: 40 % (ref 34.0–46.6)
HEMOGLOBIN: 13.4 g/dL (ref 11.1–15.9)
Immature Grans (Abs): 0 10*3/uL (ref 0.0–0.1)
Immature Granulocytes: 0 %
LYMPHS ABS: 2.1 10*3/uL (ref 0.7–3.1)
Lymphs: 30 %
MCH: 30.2 pg (ref 26.6–33.0)
MCHC: 33.5 g/dL (ref 31.5–35.7)
MCV: 90 fL (ref 79–97)
MONOS ABS: 0.6 10*3/uL (ref 0.1–0.9)
Monocytes: 9 %
NEUTROS ABS: 3.9 10*3/uL (ref 1.4–7.0)
Neutrophils: 57 %
Platelets: 360 10*3/uL (ref 150–450)
RBC: 4.43 x10E6/uL (ref 3.77–5.28)
RDW: 12.1 % — ABNORMAL LOW (ref 12.3–15.4)
Sed Rate: 6 mm/hr (ref 0–40)
URIC ACID: 3.5 mg/dL (ref 2.5–7.1)
WBC: 6.9 10*3/uL (ref 3.4–10.8)

## 2017-09-23 LAB — ALPHA-GAL PANEL
Beef (Bos spp) IgE: <0.1 kU/L (ref <0.35)
Class Interpretation: 0
LAMB CLASS INTERPRETATION: 0
PORK CLASS INTERPRETATION: 0
Pork (Sus spp) IgE: <0.1 kU/L (ref <0.35)

## 2017-09-23 LAB — CMP14+EGFR
ALT: 18 IU/L (ref 0–32)
AST: 15 IU/L (ref 0–40)
Albumin/Globulin Ratio: 1.8 (ref 1.2–2.2)
Albumin: 4.2 g/dL (ref 3.5–5.5)
Alkaline Phosphatase: 117 IU/L (ref 39–117)
BUN / CREAT RATIO: 19 (ref 9–23)
BUN: 15 mg/dL (ref 6–24)
Bilirubin Total: <0.2 mg/dL (ref 0.0–1.2)
CALCIUM: 9.2 mg/dL (ref 8.7–10.2)
CO2: 23 mmol/L (ref 20–29)
CREATININE: 0.8 mg/dL (ref 0.57–1.00)
Chloride: 105 mmol/L (ref 96–106)
GFR calc non Af Amer: 82 mL/min/{1.73_m2} (ref >59)
GFR, EST AFRICAN AMERICAN: 95 mL/min/{1.73_m2} (ref >59)
GLOBULIN, TOTAL: 2.3 g/dL (ref 1.5–4.5)
GLUCOSE: 89 mg/dL (ref 65–99)
Potassium: 4.2 mmol/L (ref 3.5–5.2)
SODIUM: 142 mmol/L (ref 134–144)
Total Protein: 6.5 g/dL (ref 6.0–8.5)

## 2017-09-23 LAB — LYME AB/WESTERN BLOT REFLEX
LYME DISEASE AB, QUANT, IGM: <0.8 index (ref 0.00–0.79)
Lyme IgG/IgM Ab: <0.91 {ISR} (ref 0.00–0.90)

## 2017-09-23 LAB — TSH: TSH: 2.43 u[IU]/mL (ref 0.450–4.500)

## 2017-09-23 LAB — ROCKY MTN SPOTTED FVR ABS PNL(IGG+IGM)
RMSF IGG: NEGATIVE
RMSF IGM: 0.82 {index} (ref 0.00–0.89)

## 2017-09-23 LAB — ANA,IFA RA DIAG PNL W/RFLX TIT/PATN
ANA Titer 1: NEGATIVE
Cyclic Citrullin Peptide Ab: 1 units (ref 0–19)

## 2017-10-07 ENCOUNTER — Other Ambulatory Visit: Payer: Self-pay | Admitting: Physician Assistant

## 2017-10-07 DIAGNOSIS — M5136 Other intervertebral disc degeneration, lumbar region: Secondary | ICD-10-CM

## 2017-10-23 ENCOUNTER — Ambulatory Visit: Payer: BC Managed Care – PPO | Admitting: Physician Assistant

## 2017-10-23 ENCOUNTER — Encounter: Payer: Self-pay | Admitting: Physician Assistant

## 2017-10-23 ENCOUNTER — Ambulatory Visit (INDEPENDENT_AMBULATORY_CARE_PROVIDER_SITE_OTHER): Payer: BC Managed Care – PPO

## 2017-10-23 VITALS — BP 127/77 | HR 77 | Temp 97.5°F | Ht 60.0 in | Wt 242.2 lb

## 2017-10-23 DIAGNOSIS — M5136 Other intervertebral disc degeneration, lumbar region: Secondary | ICD-10-CM | POA: Diagnosis not present

## 2017-10-23 DIAGNOSIS — M5410 Radiculopathy, site unspecified: Secondary | ICD-10-CM

## 2017-10-23 MED ORDER — HYDROCODONE-ACETAMINOPHEN 10-325 MG PO TABS: 1.0000 | ORAL_TABLET | Freq: Three times a day (TID) | ORAL | 0 refills | Status: DC | PRN

## 2017-10-23 NOTE — Progress Notes (Signed)
BP 127/77   Pulse 77   Temp (!) 97.5 F (36.4 C) (Oral)   Ht 5' (1.524 m)   Wt 242 lb 3.2 oz (109.9 kg)   BMI 47.30 kg/m    Subjective:    Patient ID: Deborah Bond, female    DOB: 1961-03-05, 57 y.o.   MRN: [id number]  HPI: Deborah Bond is a 57 y.o. female presenting on 10/23/2017 for Back Pain and Leg Pain (right )  This patient comes in with greater than 4 weeks low back pain and radiation down the right leg.  She has been taking anti-inflammatories and muscle relaxers to try to help however it is not improved.  She did have a previous surgery where she has a rod in her back.  She has known osteoarthritis in her back.  So with her known degenerative disc disease and right radicular pain I think she would be appropriate for an MRI.  X-rays will be performed here today.  Getting give her a note to be at work.  She is given a limited amount of narcotic medication for severe episodes of pain.  Past Medical History:  Diagnosis Date  . Hyperlipidemia   . Thyroid disease    Relevant past medical, surgical, family and social history reviewed and updated as indicated. Interim medical history since our last visit reviewed. Allergies and medications reviewed and updated. DATA REVIEWED: CHART IN EPIC  Family History reviewed for pertinent findings.  Review of Systems  Constitutional: Negative.   HENT: Negative.   Eyes: Negative.   Respiratory: Negative.   Gastrointestinal: Negative.   Genitourinary: Negative.   Musculoskeletal: Positive for arthralgias, back pain and gait problem.  Neurological: Positive for weakness.    Allergies as of 10/23/2017   No Known Allergies     Medication List        Accurate as of 10/23/17  8:33 AM. Always use your most recent med list.          atorvastatin 20 MG tablet Commonly known as:  LIPITOR Take 20 mg by mouth daily.   CALCIUM 600+D 600-200 MG-UNIT Tabs Generic drug:  Calcium Carbonate-Vitamin D Take by mouth.     cyclobenzaprine 10 MG tablet Commonly known as:  FLEXERIL Take 1 tablet (10 mg total) by mouth 3 (three) times daily as needed for muscle spasms.   escitalopram 20 MG tablet Commonly known as:  LEXAPRO Take 1 tablet (20 mg total) by mouth daily.   gabapentin 300 MG capsule Commonly known as:  NEURONTIN Take 1-3 capsules (300-900 mg total) by mouth at bedtime.   HYDROcodone-acetaminophen 10-325 MG tablet Commonly known as:  NORCO Take 1 tablet by mouth every 8 (eight) hours as needed.   ibuprofen 800 MG tablet Commonly known as:  ADVIL,MOTRIN TAKE ONE TABLET 3 TIMES A DAY AS NEEDED.   levothyroxine 125 MCG tablet Commonly known as:  SYNTHROID, LEVOTHROID Take 125 mcg by mouth daily before breakfast.   meloxicam 7.5 MG tablet Commonly known as:  MOBIC Take 1 tablet (7.5 mg total) by mouth daily.   UNABLE TO FIND Med Name: Hervey Ard Complete          Objective:    BP 127/77   Pulse 77   Temp (!) 97.5 F (36.4 C) (Oral)   Ht 5' (1.524 m)   Wt 242 lb 3.2 oz (109.9 kg)   BMI 47.30 kg/m   No Known Allergies  Wt Readings from Last 3 Encounters:  10/23/17 242  lb 3.2 oz (109.9 kg)  09/18/17 243 lb (110.2 kg)  06/25/17 236 lb 9.6 oz (107.3 kg)    Physical Exam  Constitutional: She is oriented to person, place, and time. She appears well-developed and well-nourished.  HENT:  Head: Normocephalic and atraumatic.  Eyes: Pupils are equal, round, and reactive to light. Conjunctivae and EOM are normal.  Cardiovascular: Normal rate, regular rhythm, normal heart sounds and intact distal pulses.  Pulmonary/Chest: Effort normal and breath sounds normal.  Abdominal: Soft. Bowel sounds are normal.  Musculoskeletal:       Lumbar back: She exhibits decreased range of motion, tenderness, pain and spasm.       Back:  Neurological: She is alert and oriented to person, place, and time. She has normal reflexes.  Skin: Skin is warm and dry. No rash noted.  Psychiatric: She has a  normal mood and affect. Her behavior is normal. Judgment and thought content normal.    Results for orders placed or performed in visit on 09/18/17  CMP14+EGFR  Result Value Ref Range   Glucose 89 65 - 99 mg/dL   BUN 15 6 - 24 mg/dL   Creatinine, Ser 0.80 0.57 - 1.00 mg/dL   GFR calc non Af Amer 82 >59 mL/min/1.73   GFR calc Af Amer 95 >59 mL/min/1.73   BUN/Creatinine Ratio 19 9 - 23   Sodium 142 134 - 144 mmol/L   Potassium 4.2 3.5 - 5.2 mmol/L   Chloride 105 96 - 106 mmol/L   CO2 23 20 - 29 mmol/L   Calcium 9.2 8.7 - 10.2 mg/dL   Total Protein 6.5 6.0 - 8.5 g/dL   Albumin 4.2 3.5 - 5.5 g/dL   Globulin, Total 2.3 1.5 - 4.5 g/dL   Albumin/Globulin Ratio 1.8 1.2 - 2.2   Bilirubin Total <0.2 0.0 - 1.2 mg/dL   Alkaline Phosphatase 117 39 - 117 IU/L   AST 15 0 - 40 IU/L   ALT 18 0 - 32 IU/L  TSH  Result Value Ref Range   TSH 2.430 0.450 - 4.500 uIU/mL  Alpha-Gal Panel  Result Value Ref Range   Beef (Bos spp) IgE <0.10 <0.35 kU/L   Class Interpretation 0    Lamb/Mutton (Ovis spp) IgE <0.10 <0.35 kU/L   Class Interpretation 0    Pork (Sus spp) IgE <0.10 <0.35 kU/L   Class Interpretation 0    Alpha Gal IgE* <0.10 <0.10 kU/L  Arthritis Panel  Result Value Ref Range   Uric Acid 3.5 2.5 - 7.1 mg/dL   Rhuematoid fact SerPl-aCnc <10.0 0.0 - 13.9 IU/mL   WBC 6.9 3.4 - 10.8 x10E3/uL   RBC 4.43 3.77 - 5.28 x10E6/uL   Hemoglobin 13.4 11.1 - 15.9 g/dL   Hematocrit 40.0 34.0 - 46.6 %   MCV 90 79 - 97 fL   MCH 30.2 26.6 - 33.0 pg   MCHC 33.5 31.5 - 35.7 g/dL   RDW 12.1 (L) 12.3 - 15.4 %   Platelets 360 150 - 450 x10E3/uL   Neutrophils 57 Not Estab. %   Lymphs 30 Not Estab. %   Monocytes 9 Not Estab. %   Eos 3 Not Estab. %   Basos 1 Not Estab. %   Neutrophils Absolute 3.9 1.4 - 7.0 x10E3/uL   Lymphocytes Absolute 2.1 0.7 - 3.1 x10E3/uL   Monocytes Absolute 0.6 0.1 - 0.9 x10E3/uL   EOS (ABSOLUTE) 0.2 0.0 - 0.4 x10E3/uL   Basophils Absolute 0.1 0.0 - 0.2 x10E3/uL   Immature  Granulocytes 0 Not Estab. %   Immature Grans (Abs) 0.0 0.0 - 0.1 x10E3/uL   Sed Rate 6 0 - 40 mm/hr  ANA,IFA RA Diag Pnl w/rflx Tit/Patn  Result Value Ref Range   ANA Titer 1 Negative    Cyclic Citrullin Peptide Ab 1 0 - 19 units  Lyme Ab/Western Blot Reflex  Result Value Ref Range   Lyme IgG/IgM Ab <0.91 0.00 - 0.90 ISR   LYME DISEASE AB, QUANT, IGM <0.80 0.00 - 0.79 index  Rocky mtn spotted fvr abs pnl(IgG+IgM)  Result Value Ref Range   RMSF IgG Negative Negative   RMSF IgM 0.82 0.00 - 0.89 index      Assessment & Plan:   1. DDD (degenerative disc disease), lumbar - DG Lumbar Spine 2-3 Views; Future - Ambulatory referral to Physical Therapy  2. Radicular syndrome of right leg - DG Lumbar Spine 2-3 Views; Future - Ambulatory referral to Physical Therapy   Continue all other maintenance medications as listed above.  Follow up plan: No follow-ups on file.  Educational handout given for [place] PA-C [facility] [address]  [place], [place] [address] [phone]   10/23/2017, 8:33 AM

## 2017-11-12 ENCOUNTER — Other Ambulatory Visit: Payer: Self-pay | Admitting: *Deleted

## 2017-11-12 DIAGNOSIS — M5136 Other intervertebral disc degeneration, lumbar region: Secondary | ICD-10-CM

## 2017-11-26 ENCOUNTER — Other Ambulatory Visit: Payer: Self-pay | Admitting: Physician Assistant

## 2017-11-26 DIAGNOSIS — E7800 Pure hypercholesterolemia, unspecified: Secondary | ICD-10-CM

## 2017-11-26 DIAGNOSIS — E039 Hypothyroidism, unspecified: Secondary | ICD-10-CM

## 2017-11-28 NOTE — Telephone Encounter (Signed)
Last thyroid and lipid 12/05/16  Last seen 10/23/17

## 2017-12-02 ENCOUNTER — Other Ambulatory Visit: Payer: Self-pay | Admitting: Physician Assistant

## 2017-12-02 DIAGNOSIS — E7800 Pure hypercholesterolemia, unspecified: Secondary | ICD-10-CM

## 2017-12-02 DIAGNOSIS — E039 Hypothyroidism, unspecified: Secondary | ICD-10-CM

## 2017-12-03 ENCOUNTER — Other Ambulatory Visit: Payer: Self-pay | Admitting: Physician Assistant

## 2017-12-12 ENCOUNTER — Ambulatory Visit: Payer: BC Managed Care – PPO | Attending: Physician Assistant | Admitting: Physical Therapy

## 2017-12-12 ENCOUNTER — Encounter: Payer: Self-pay | Admitting: Physical Therapy

## 2017-12-12 DIAGNOSIS — G8929 Other chronic pain: Secondary | ICD-10-CM | POA: Diagnosis present

## 2017-12-12 DIAGNOSIS — M545 Low back pain: Secondary | ICD-10-CM | POA: Diagnosis present

## 2017-12-12 NOTE — Patient Instructions (Signed)
[facility]  DRY NEEDLING CONSENT FORM   Trigger point dry needling is a physical therapy approach to treat Myofascial Pain and Dysfunction.  Dry Needling (DN) is a valuable and effective way to deactivate myofascial trigger points (muscle knots/pain). It is skilled intervention that uses a thin filiform needle to penetrate the skin and stimulate underlying myofascial trigger points, muscular, and connective tissues for the management of neuromusculoskeletal pain and movement impairments.  A local twitch response (LTR) will be elicited.  This can sometimes feel like a deep ache in the muscle during the procedure. Multiple trigger points in multiple muscles can be treated during each treatment.  No medication of any kind is injected.   As with any medical treatment and procedure, there are possible adverse events.  While significant adverse events are uncommon, they do sometimes occur and must be considered prior to giving consent.  1. Dry needling often causes a "post needling soreness".  There can be an increase in pain from a couple of hours to 2-3 days, followed by an improvement in the overall pain state. 2. Any time a needle is used there is a risk of infection.  However, we are using new, sterile, and disposable needles; infections are extremely rare. 3. There is a possibility that you may bleed or bruise.  You may feel tired and some nausea following treatment. 4. There is a rare possibility of a pneumothorax (air in the chest cavity). 5. Allergic reaction to nickel in the stainless steel needle. 6. If a nerve is touched, it may cause paresthesia (a prickling/shock sensation) which is usually brief, but may continue for a couple of days.  Following treatment stay hydrated.  Continue regular activities but not too vigorous initially after treatment for 24-48 hours.  Dry Needling is best when combined with other physical therapy interventions such as  strengthening, stretching and other therapeutic modalities.   PLEASE ANSWER THE FOLLOWING QUESTIONS:  Do you have a lack of sensation?   Y/N  Do you have a phobia or fear of needles  Y/N  Are you pregnant?    Y/N If yes:  How many weeks? __________ Do you have any implanted devices?  Y/N If yes:  Pacemaker/Spinal Cord Stimulator/Deep Brain Stimulator/Insulin Pump/Other: ________________ Do you have any implants?  Y/N If yes: Breast/Facial/Pecs/Buttocks/Calves/Hip  Replacement/ Knee Replacement/Other: _________ Do you take any blood thinners?   Y/N If yes: Coumadin (Warfarin)/Other: ___________________ Do you have a bleeding disorder?   Y/N If yes: What kind: _________________________________ Do you take any immunosuppressants?  Y/N If yes:   What kind: _________________________________ Do you take anti-inflammatories?   Y/N If yes: What kind: Advil/Aspirin/Other: ________________ Have you ever been diagnosed with Scoliosis? Y/N Have you had back surgery?   Y/N If yes:  Laminectomy/Fusion/Other: ___________________   I have read, or had read to me, the above.  I have had the opportunity to ask any questions.  All of my questions have been answered to my satisfaction and I understand the risks involved with dry needling.  I consent to examination and treatment at [facility], including dry needling, of any and all of my involved and affected muscles.  

## 2017-12-12 NOTE — Therapy (Signed)
[facility] [address] [place], [place], [address] Phone: [phone]   Fax:  [phone]  Physical Therapy Evaluation  Patient Details  Name: Deborah Bond MRN: [id number] Date of Birth: [birth year] Referring Provider: Prudy Feeler PA-C   Encounter Date: 12/12/2017  PT End of Session - 12/12/17 1604    Visit Number  1    Number of Visits  8    Date for PT Re-Evaluation  01/16/18    PT Start Time  0309    PT Stop Time  0349    PT Time Calculation (min)  40 min    Activity Tolerance  Patient tolerated treatment well    Behavior During Therapy  [facility] for tasks assessed/performed       Past Medical History:  Diagnosis Date  . Hyperlipidemia   . Thyroid disease     Past Surgical History:  Procedure Laterality Date  . ABDOMINAL HYSTERECTOMY    . BACK SURGERY    . THYROID SURGERY      There were no vitals filed for this visit.   Subjective Assessment - 12/12/17 1720    Subjective  The patient presents to the clinic today with a long history of low back pain.  She has had 7 back surgeries and also had an infection in her spine.  She still has the wires implanted from her previous lumbar stimulator but the battery is removed.  She reports severe (10/10) pain when she first gets out of bed and she is unable to straighten up and move well for nearly an hour.  Her pain-level today is a 4/10.  She has not found anything that consistently decreases her pain.  She is also not sure of anything that specifically increases her pain.  2 years ago she was able to do Cross-Fit and would like to retrun to some form of exercise in the future.    Pertinent History  7 lumbar surgeries(screw and rod fixation); Thyroid disease.    Patient Stated Goals  Decrease pain and exercise again.    Currently in Pain?  Yes    Pain Score  4     Pain Location  Back    Pain Orientation  Right    Pain Descriptors / Indicators  Aching;Stabbing    Pain Type   Chronic pain    Pain Onset  More than a month ago    Pain Frequency  Constant    Aggravating Factors   See above.    Pain Relieving Factors  See above.         [facility] PT Assessment - 12/12/17 0001      Assessment   Medical Diagnosis  Lumbar DDD.    Referring Provider  Prudy Feeler PA-C      Precautions   Precautions  --   7 lumbar surgeries(fusion).  Neurostimulator.     Restrictions   Weight Bearing Restrictions  No      Balance Screen   Has the patient fallen in the past 6 months  Yes    How many times?  --   2.   Has the patient had a decrease in activity level because of a fear of falling?   No    Is the patient reluctant to leave their home because of a fear of falling?   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      Posture/Postural Control   Posture Comments  The patient's posture is generally quite good.        ROM / Strength   AROM / PROM / Strength  AROM;Strength      AROM   Overall AROM Comments  Full active lumbar flexion.      Strength   Overall Strength Comments  Normal bilateral LE strength.      Palpation   Palpation comment  The patient presents with a great deal of tone in her right QL which is very palpably tender.      Special Tests   Other special tests  Normal bilateral Patellar reflexes, absent Achilles reflexes. (=) leg lengths; (-) SLR and FABER testing.      Ambulation/Gait   Gait Comments  WNL.                Objective measurements completed on examination: See above findings.      [facility] Adult PT Treatment/Exercise - 12/12/17 0001      Modalities   Modalities  Moist Heat      Moist Heat Therapy   Number Minutes Moist Heat  15 Minutes    Moist Heat Location  --   Low back.                 PT Long Term Goals - 12/12/17 1744      PT LONG TERM GOAL #1   Title  Independent with an advanced HEP.    Time   4    Period  Weeks    Status  New      PT LONG TERM GOAL #2   Title  Morning pain-level of not > 4/10.    Time  4    Period  Weeks    Status  New      PT LONG TERM GOAL #3   Title  Perform ADL's with pain not > 4/10.    Time  4    Period  Weeks    Status  New             Plan - 12/12/17 1739    Clinical Impression Statement  The patient presents to OPPT with chronic low back pain with pain that radiates into her LE's at times.  She is in severe pain when getting out of bed in the morning.  Most notably was a great deal of tone in her right QL which was her CC today.  Her pain has limited her functionally and she is no longer able to exercise like she once was.  Patient will benefit from skilled physical therapy intervention to address deficits with emphasis on soft tissue work and core exercise progression.    History and Personal Factors relevant to plan of care:  7 lumbar surgeries(screw and rod fixation); Thyroid disease.    Clinical Presentation  Evolving    Clinical Presentation due to:  Not improving.    Clinical Decision Making  Low    Rehab Potential  Good    PT Frequency  2x / week    PT Duration  4 weeks    PT Treatment/Interventions  ADLs/Self Care Home Management;Cryotherapy;Moist Heat;Therapeutic activities;Therapeutic exercise;Patient/family education;Manual techniques    PT Next Visit Plan  STW/M right QL release technique.  Progression into a core exercise program.    Consulted and Agree with Plan of Care  Patient       Patient will benefit from skilled therapeutic  intervention in order to improve the following deficits and impairments:  Pain, Impaired tone, Increased muscle spasms, Decreased activity tolerance  Visit Diagnosis: Chronic right-sided low back pain, with sciatica presence unspecified - Plan: PT plan of care cert/re-cert     Problem List Patient Active Problem List   Diagnosis Date Noted  . Radicular syndrome of right leg 10/23/2017  .  De Quervain's disease (tenosynovitis) 04/25/2017  . DDD (degenerative disc disease), lumbar 12/05/2016  . Acquired hypothyroidism 12/05/2016  . Pure hypercholesterolemia 12/05/2016  . History of staphylococcal infection 12/05/2016    Jacquie Lukes, [place] MPT 12/12/2017, 5:48 PM  [facility] [address] [place], [place], [address] Phone: [phone]   Fax:  [phone]  Name: Deborah Bond MRN: [id number] Date of Birth: 1961/03/13

## 2017-12-17 ENCOUNTER — Encounter: Payer: BC Managed Care – PPO | Admitting: Physical Therapy

## 2018-02-17 ENCOUNTER — Ambulatory Visit: Payer: BC Managed Care – PPO | Admitting: Physician Assistant

## 2018-02-17 ENCOUNTER — Encounter: Payer: Self-pay | Admitting: Physician Assistant

## 2018-02-17 VITALS — BP 136/71 | HR 73 | Temp 97.4°F | Ht 60.0 in | Wt 247.4 lb

## 2018-02-17 DIAGNOSIS — L239 Allergic contact dermatitis, unspecified cause: Secondary | ICD-10-CM | POA: Diagnosis not present

## 2018-02-17 DIAGNOSIS — R635 Abnormal weight gain: Secondary | ICD-10-CM

## 2018-02-17 MED ORDER — PHENTERMINE HCL 37.5 MG PO TABS: 37.5000 mg | ORAL_TABLET | Freq: Every day | ORAL | 1 refills | Status: DC

## 2018-02-17 NOTE — Progress Notes (Signed)
BP 136/71   Pulse 73   Temp (!) 97.4 F (36.3 C) (Oral)   Ht 5' (1.524 m)   Wt 247 lb 6.4 oz (112.2 kg)   BMI 48.32 kg/m    Subjective:    Patient ID: Deborah Bond, female    DOB: 11/21/1960, 58 y.o.   MRN: [id number]  HPI: Deborah Bond is a 57 y.o. female presenting on 02/17/2018 for all over itching  Patient comes in having itching all over his legs and on for several weeks.  She has not had a rash.  It bothers her throughout the day.  She has tried some over-the-counter antihistamines with much relief.  She denies any new medications, no new products, no new detergents.  She has not brought anything new into her house.  Past Medical History:  Diagnosis Date  . Hyperlipidemia   . Thyroid disease    Relevant past medical, surgical, family and social history reviewed and updated as indicated. Interim medical history since our last visit reviewed. Allergies and medications reviewed and updated. DATA REVIEWED: CHART IN EPIC  Family History reviewed for pertinent findings.  Review of Systems  Constitutional: Positive for fatigue and unexpected weight change. Negative for activity change and fever.  HENT: Negative.   Eyes: Negative.   Respiratory: Negative.  Negative for cough.   Cardiovascular: Negative.  Negative for chest pain.  Gastrointestinal: Negative.  Negative for abdominal pain.  Endocrine: Negative.   Genitourinary: Negative.  Negative for dysuria.  Musculoskeletal: Negative.   Skin: Negative.  Negative for color change, pallor, rash and wound.  Neurological: Negative.     Allergies as of 02/17/2018   No Known Allergies     Medication List        Accurate as of 02/17/18 10:34 PM. Always use your most recent med list.          atorvastatin 20 MG tablet Commonly known as:  LIPITOR Take 1 tablet (20 mg total) by mouth daily. (Needs to be seen)   CALCIUM 600+D 600-200 MG-UNIT Tabs Generic drug:  Calcium Carbonate-Vitamin D Take by  mouth.   cyclobenzaprine 10 MG tablet Commonly known as:  FLEXERIL Take 1 tablet (10 mg total) by mouth 3 (three) times daily as needed for muscle spasms.   escitalopram 20 MG tablet Commonly known as:  LEXAPRO Take 1 tablet (20 mg total) by mouth daily.   gabapentin 300 MG capsule Commonly known as:  NEURONTIN Take 1-3 capsules (300-900 mg total) by mouth at bedtime.   levothyroxine 125 MCG tablet Commonly known as:  SYNTHROID, LEVOTHROID TAKE ONE (1) TABLET EACH DAY   meloxicam 7.5 MG tablet Commonly known as:  MOBIC Take 1 tablet (7.5 mg total) by mouth daily.   phentermine 37.5 MG tablet Commonly known as:  ADIPEX-P Take 1 tablet (37.5 mg total) by mouth daily before breakfast.   UNABLE TO FIND Med Name: Hervey Ard Complete          Objective:    BP 136/71   Pulse 73   Temp (!) 97.4 F (36.3 C) (Oral)   Ht 5' (1.524 m)   Wt 247 lb 6.4 oz (112.2 kg)   BMI 48.32 kg/m   No Known Allergies  Wt Readings from Last 3 Encounters:  02/17/18 247 lb 6.4 oz (112.2 kg)  10/23/17 242 lb 3.2 oz (109.9 kg)  09/18/17 243 lb (110.2 kg)    Physical Exam  Constitutional: She is oriented to person, place, and time.  She appears well-developed and well-nourished.  HENT:  Head: Normocephalic and atraumatic.  Eyes: Pupils are equal, round, and reactive to light. Conjunctivae and EOM are normal.  Cardiovascular: Normal rate, regular rhythm, normal heart sounds and intact distal pulses.  Pulmonary/Chest: Effort normal and breath sounds normal.  Abdominal: Soft. Bowel sounds are normal.  Neurological: She is alert and oriented to person, place, and time. She has normal reflexes.  Skin: Skin is warm and dry. No rash noted.  Psychiatric: She has a normal mood and affect. Her behavior is normal. Judgment and thought content normal.    Results for orders placed or performed in visit on 09/18/17  CMP14+EGFR  Result Value Ref Range   Glucose 89 65 - 99 mg/dL   BUN 15 6 - 24 mg/dL    Creatinine, Ser 0.80 0.57 - 1.00 mg/dL   GFR calc non Af Amer 82 >59 mL/min/1.73   GFR calc Af Amer 95 >59 mL/min/1.73   BUN/Creatinine Ratio 19 9 - 23   Sodium 142 134 - 144 mmol/L   Potassium 4.2 3.5 - 5.2 mmol/L   Chloride 105 96 - 106 mmol/L   CO2 23 20 - 29 mmol/L   Calcium 9.2 8.7 - 10.2 mg/dL   Total Protein 6.5 6.0 - 8.5 g/dL   Albumin 4.2 3.5 - 5.5 g/dL   Globulin, Total 2.3 1.5 - 4.5 g/dL   Albumin/Globulin Ratio 1.8 1.2 - 2.2   Bilirubin Total <0.2 0.0 - 1.2 mg/dL   Alkaline Phosphatase 117 39 - 117 IU/L   AST 15 0 - 40 IU/L   ALT 18 0 - 32 IU/L  TSH  Result Value Ref Range   TSH 2.430 0.450 - 4.500 uIU/mL  Alpha-Gal Panel  Result Value Ref Range   Beef (Bos spp) IgE <0.10 <0.35 kU/L   Class Interpretation 0    Lamb/Mutton (Ovis spp) IgE <0.10 <0.35 kU/L   Class Interpretation 0    Pork (Sus spp) IgE <0.10 <0.35 kU/L   Class Interpretation 0    Alpha Gal IgE* <0.10 <0.10 kU/L  Arthritis Panel  Result Value Ref Range   Uric Acid 3.5 2.5 - 7.1 mg/dL   Rhuematoid fact SerPl-aCnc <10.0 0.0 - 13.9 IU/mL   WBC 6.9 3.4 - 10.8 x10E3/uL   RBC 4.43 3.77 - 5.28 x10E6/uL   Hemoglobin 13.4 11.1 - 15.9 g/dL   Hematocrit 40.0 34.0 - 46.6 %   MCV 90 79 - 97 fL   MCH 30.2 26.6 - 33.0 pg   MCHC 33.5 31.5 - 35.7 g/dL   RDW 12.1 (L) 12.3 - 15.4 %   Platelets 360 150 - 450 x10E3/uL   Neutrophils 57 Not Estab. %   Lymphs 30 Not Estab. %   Monocytes 9 Not Estab. %   Eos 3 Not Estab. %   Basos 1 Not Estab. %   Neutrophils Absolute 3.9 1.4 - 7.0 x10E3/uL   Lymphocytes Absolute 2.1 0.7 - 3.1 x10E3/uL   Monocytes Absolute 0.6 0.1 - 0.9 x10E3/uL   EOS (ABSOLUTE) 0.2 0.0 - 0.4 x10E3/uL   Basophils Absolute 0.1 0.0 - 0.2 x10E3/uL   Immature Granulocytes 0 Not Estab. %   Immature Grans (Abs) 0.0 0.0 - 0.1 x10E3/uL   Sed Rate 6 0 - 40 mm/hr  ANA,IFA RA Diag Pnl w/rflx Tit/Patn  Result Value Ref Range   ANA Titer 1 Negative    Cyclic Citrullin Peptide Ab 1 0 - 19 units  Lyme  Ab/Western Blot Reflex  Result Value Ref Range   Lyme IgG/IgM Ab <0.91 0.00 - 0.90 ISR   LYME DISEASE AB, QUANT, IGM <0.80 0.00 - 0.79 index  Rocky mtn spotted fvr abs pnl(IgG+IgM)  Result Value Ref Range   RMSF IgG Negative Negative   RMSF IgM 0.82 0.00 - 0.89 index      Assessment & Plan:   1. Allergic dermatitis claritin  2. Weight gain - phentermine (ADIPEX-P) 37.5 MG tablet; Take 1 tablet (37.5 mg total) by mouth daily before breakfast.  Dispense: 30 tablet; Refill: 1   Continue all other maintenance medications as listed above.  Follow up plan: Return in about 4 weeks (around 03/17/2018) for recheck.  Educational handout given for [place] PA-C [place] [address]  [place], [place] [address] [phone]   02/17/2018, 10:34 PM

## 2018-03-17 ENCOUNTER — Ambulatory Visit: Payer: BC Managed Care – PPO | Admitting: Physician Assistant

## 2018-03-17 ENCOUNTER — Encounter: Payer: Self-pay | Admitting: Physician Assistant

## 2018-03-17 DIAGNOSIS — R635 Abnormal weight gain: Secondary | ICD-10-CM | POA: Diagnosis not present

## 2018-03-17 MED ORDER — PHENTERMINE HCL 37.5 MG PO TABS: 37.5000 mg | ORAL_TABLET | Freq: Every day | ORAL | 1 refills | Status: DC

## 2018-03-24 NOTE — Progress Notes (Signed)
BP 121/65   Pulse 75   Temp (!) 97.3 F (36.3 C) (Oral)   Ht 5' (1.524 m)   Wt 241 lb (109.3 kg)   BMI 47.07 kg/m    Subjective:    Patient ID: Deborah Bond, female    DOB: 1960/04/06, 57 y.o.   MRN: [id number]  HPI: Deborah Bond is a 57 y.o. female presenting on 03/17/2018 for Follow-up (rash on legs has improved) and Weight Check  This patient comes in for periodic recheck on medications and conditions including weight loss efforts.  She has been taking phentermine and doing well.  She has lost about 80 pounds since her last visit.  She is feeling very good and excited about her success.  Refills are needed.  All medications are reviewed today. There are no reports of any problems with the medications. All of the medical conditions are reviewed and updated.  Lab work is reviewed and will be ordered as medically necessary. There are no new problems reported with today's visit.   Past Medical History:  Diagnosis Date  . Hyperlipidemia   . Thyroid disease    Relevant past medical, surgical, family and social history reviewed and updated as indicated. Interim medical history since our last visit reviewed. Allergies and medications reviewed and updated. DATA REVIEWED: CHART IN EPIC  Family History reviewed for pertinent findings.  Review of Systems  Constitutional: Negative.   HENT: Negative.   Eyes: Negative.   Respiratory: Negative.   Gastrointestinal: Negative.   Genitourinary: Negative.     Allergies as of 03/17/2018   No Known Allergies     Medication List       Accurate as of March 17, 2018 11:59 PM. Always use your most recent med list.        atorvastatin 20 MG tablet Commonly known as:  LIPITOR Take 1 tablet (20 mg total) by mouth daily. (Needs to be seen)   CALCIUM 600+D 600-200 MG-UNIT Tabs Generic drug:  Calcium Carbonate-Vitamin D Take by mouth.   cyclobenzaprine 10 MG tablet Commonly known as:  FLEXERIL Take 1 tablet (10 mg  total) by mouth 3 (three) times daily as needed for muscle spasms.   escitalopram 20 MG tablet Commonly known as:  LEXAPRO Take 1 tablet (20 mg total) by mouth daily.   gabapentin 300 MG capsule Commonly known as:  NEURONTIN Take 1-3 capsules (300-900 mg total) by mouth at bedtime.   levothyroxine 125 MCG tablet Commonly known as:  SYNTHROID, LEVOTHROID TAKE ONE (1) TABLET EACH DAY   meloxicam 7.5 MG tablet Commonly known as:  MOBIC Take 1 tablet (7.5 mg total) by mouth daily.   phentermine 37.5 MG tablet Commonly known as:  ADIPEX-P Take 1 tablet (37.5 mg total) by mouth daily before breakfast.   UNABLE TO FIND Med Name: Hervey Ard Complete          Objective:    BP 121/65   Pulse 75   Temp (!) 97.3 F (36.3 C) (Oral)   Ht 5' (1.524 m)   Wt 241 lb (109.3 kg)   BMI 47.07 kg/m   No Known Allergies  Wt Readings from Last 3 Encounters:  03/17/18 241 lb (109.3 kg)  02/17/18 247 lb 6.4 oz (112.2 kg)  10/23/17 242 lb 3.2 oz (109.9 kg)    Physical Exam Constitutional:      Appearance: She is well-developed.  HENT:     Head: Normocephalic and atraumatic.  Eyes:     Conjunctiva/sclera: Conjunctivae  normal.     Pupils: Pupils are equal, round, and reactive to light.  Cardiovascular:     Rate and Rhythm: Normal rate and regular rhythm.     Heart sounds: Normal heart sounds.  Pulmonary:     Effort: Pulmonary effort is normal.     Breath sounds: Normal breath sounds.  Abdominal:     General: Bowel sounds are normal.     Palpations: Abdomen is soft.  Skin:    General: Skin is warm and dry.     Findings: No rash.  Neurological:     Mental Status: She is alert and oriented to person, place, and time.     Deep Tendon Reflexes: Reflexes are normal and symmetric.  Psychiatric:        Behavior: Behavior normal.        Thought Content: Thought content normal.        Judgment: Judgment normal.     Results for orders placed or performed in visit on 09/18/17    CMP14+EGFR  Result Value Ref Range   Glucose 89 65 - 99 mg/dL   BUN 15 6 - 24 mg/dL   Creatinine, Ser 0.80 0.57 - 1.00 mg/dL   GFR calc non Af Amer 82 >59 mL/min/1.73   GFR calc Af Amer 95 >59 mL/min/1.73   BUN/Creatinine Ratio 19 9 - 23   Sodium 142 134 - 144 mmol/L   Potassium 4.2 3.5 - 5.2 mmol/L   Chloride 105 96 - 106 mmol/L   CO2 23 20 - 29 mmol/L   Calcium 9.2 8.7 - 10.2 mg/dL   Total Protein 6.5 6.0 - 8.5 g/dL   Albumin 4.2 3.5 - 5.5 g/dL   Globulin, Total 2.3 1.5 - 4.5 g/dL   Albumin/Globulin Ratio 1.8 1.2 - 2.2   Bilirubin Total <0.2 0.0 - 1.2 mg/dL   Alkaline Phosphatase 117 39 - 117 IU/L   AST 15 0 - 40 IU/L   ALT 18 0 - 32 IU/L  TSH  Result Value Ref Range   TSH 2.430 0.450 - 4.500 uIU/mL  Alpha-Gal Panel  Result Value Ref Range   Beef (Bos spp) IgE <0.10 <0.35 kU/L   Class Interpretation 0    Lamb/Mutton (Ovis spp) IgE <0.10 <0.35 kU/L   Class Interpretation 0    Pork (Sus spp) IgE <0.10 <0.35 kU/L   Class Interpretation 0    Alpha Gal IgE* <0.10 <0.10 kU/L  Arthritis Panel  Result Value Ref Range   Uric Acid 3.5 2.5 - 7.1 mg/dL   Rhuematoid fact SerPl-aCnc <10.0 0.0 - 13.9 IU/mL   WBC 6.9 3.4 - 10.8 x10E3/uL   RBC 4.43 3.77 - 5.28 x10E6/uL   Hemoglobin 13.4 11.1 - 15.9 g/dL   Hematocrit 40.0 34.0 - 46.6 %   MCV 90 79 - 97 fL   MCH 30.2 26.6 - 33.0 pg   MCHC 33.5 31.5 - 35.7 g/dL   RDW 12.1 (L) 12.3 - 15.4 %   Platelets 360 150 - 450 x10E3/uL   Neutrophils 57 Not Estab. %   Lymphs 30 Not Estab. %   Monocytes 9 Not Estab. %   Eos 3 Not Estab. %   Basos 1 Not Estab. %   Neutrophils Absolute 3.9 1.4 - 7.0 x10E3/uL   Lymphocytes Absolute 2.1 0.7 - 3.1 x10E3/uL   Monocytes Absolute 0.6 0.1 - 0.9 x10E3/uL   EOS (ABSOLUTE) 0.2 0.0 - 0.4 x10E3/uL   Basophils Absolute 0.1 0.0 - 0.2 x10E3/uL   Immature  Granulocytes 0 Not Estab. %   Immature Grans (Abs) 0.0 0.0 - 0.1 x10E3/uL   Sed Rate 6 0 - 40 mm/hr  ANA,IFA RA Diag Pnl w/rflx Tit/Patn  Result Value  Ref Range   ANA Titer 1 Negative    Cyclic Citrullin Peptide Ab 1 0 - 19 units  Lyme Ab/Western Blot Reflex  Result Value Ref Range   Lyme IgG/IgM Ab <0.91 0.00 - 0.90 ISR   LYME DISEASE AB, QUANT, IGM <0.80 0.00 - 0.79 index  Rocky mtn spotted fvr abs pnl(IgG+IgM)  Result Value Ref Range   RMSF IgG Negative Negative   RMSF IgM 0.82 0.00 - 0.89 index      Assessment & Plan:   1. Weight gain - phentermine (ADIPEX-P) 37.5 MG tablet; Take 1 tablet (37.5 mg total) by mouth daily before breakfast.  Dispense: 30 tablet; Refill: 1   Continue all other maintenance medications as listed above.  Follow up plan: Return in about 2 months (around 05/18/2018).  Educational handout given for [place] PA-C [place] [address]  [place], [place] [address] [phone]   03/24/2018, 4:52 PM

## 2018-03-28 ENCOUNTER — Other Ambulatory Visit: Payer: Self-pay | Admitting: Physician Assistant

## 2018-03-28 DIAGNOSIS — E7800 Pure hypercholesterolemia, unspecified: Secondary | ICD-10-CM

## 2018-03-28 DIAGNOSIS — M5136 Other intervertebral disc degeneration, lumbar region: Secondary | ICD-10-CM

## 2018-04-15 ENCOUNTER — Ambulatory Visit: Payer: BC Managed Care – PPO | Admitting: Orthopaedic Surgery

## 2018-04-15 ENCOUNTER — Telehealth: Payer: Self-pay | Admitting: Orthopaedic Surgery

## 2018-04-15 ENCOUNTER — Ambulatory Visit (INDEPENDENT_AMBULATORY_CARE_PROVIDER_SITE_OTHER): Payer: BC Managed Care – PPO

## 2018-04-15 ENCOUNTER — Encounter: Payer: Self-pay | Admitting: Orthopaedic Surgery

## 2018-04-15 VITALS — BP 138/86 | HR 100 | Ht 61.0 in | Wt 235.0 lb

## 2018-04-15 DIAGNOSIS — M25561 Pain in right knee: Secondary | ICD-10-CM | POA: Diagnosis not present

## 2018-04-15 NOTE — Progress Notes (Signed)
Subjective:    Patient ID: Deborah Bond, female    DOB: 1960-05-21, 58 y.o.   MRN: [id number]  HPI She started having pain in the right knee about five weeks ago. She was walking and felt a pop.  It has been hurting since then.  She has swelling and popping but no giving way or locking.  She has tried ice and heat as well as ibuprofen 800 mgm tid with minimal help.  She has no redness, no numbness.  It awakens her at night sometimes.  She is tired of her right knee hurting.  She has seen no other physician.   Review of Systems  Constitutional: Positive for activity change.  Musculoskeletal: Positive for arthralgias, gait problem and joint swelling.  All other systems reviewed and are negative.  For Review of Systems, all other systems reviewed and are negative.  The following is a summary of the past history medically, past history surgically, known current medicines, social history and family history.  This information is gathered electronically by the computer from prior information and documentation.  I review this each visit and have found including this information at this point in the chart is beneficial and informative.   Past Medical History:  Diagnosis Date  . Hyperlipidemia   . Thyroid disease     Past Surgical History:  Procedure Laterality Date  . ABDOMINAL HYSTERECTOMY    . BACK SURGERY    . THYROID SURGERY      Current Outpatient Medications on File Prior to Visit  Medication Sig Dispense Refill  . atorvastatin (LIPITOR) 20 MG tablet TAKE ONE (1) TABLET EACH DAY 90 tablet 2  . Calcium Carbonate-Vitamin D (CALCIUM 600+D) 600-200 MG-UNIT TABS Take by mouth.    . escitalopram (LEXAPRO) 20 MG tablet Take 1 tablet (20 mg total) by mouth daily. 90 tablet 3  . ibuprofen (ADVIL,MOTRIN) 800 MG tablet TAKE ONE TABLET 3 TIMES A DAY AS NEEDED. 90 tablet 2  . levothyroxine (SYNTHROID, LEVOTHROID) 125 MCG tablet TAKE ONE (1) TABLET EACH DAY 90 tablet 2  . phentermine  (ADIPEX-P) 37.5 MG tablet Take 1 tablet (37.5 mg total) by mouth daily before breakfast. 30 tablet 1  . UNABLE TO FIND Med Name: Amanda Cockayne Complete    . cyclobenzaprine (FLEXERIL) 10 MG tablet Take 1 tablet (10 mg total) by mouth 3 (three) times daily as needed for muscle spasms. (Patient not taking: Reported on 04/15/2018) 60 tablet 0  . gabapentin (NEURONTIN) 300 MG capsule Take 1-3 capsules (300-900 mg total) by mouth at bedtime. (Patient not taking: Reported on 04/15/2018) 90 capsule 11  . meloxicam (MOBIC) 7.5 MG tablet Take 1 tablet (7.5 mg total) by mouth daily. (Patient not taking: Reported on 04/15/2018) 30 tablet 2   No current facility-administered medications on file prior to visit.     Social History   Socioeconomic History  . Marital status: Married    Spouse name: Not on file  . Number of children: Not on file  . Years of education: Not on file  . Highest education level: Not on file  Occupational History  . Not on file  Social Needs  . Financial resource strain: Not on file  . Food insecurity:    Worry: Not on file    Inability: Not on file  . Transportation needs:    Medical: Not on file    Non-medical: Not on file  Tobacco Use  . Smoking status: Former Games developer  . Smokeless tobacco: Never Used  Substance and Sexual Activity  . Alcohol use: No  . Drug use: No  . Sexual activity: Yes  Lifestyle  . Physical activity:    Days per week: Not on file    Minutes per session: Not on file  . Stress: Not on file  Relationships  . Social connections:    Talks on phone: Not on file    Gets together: Not on file    Attends religious service: Not on file    Active member of club or organization: Not on file    Attends meetings of clubs or organizations: Not on file    Relationship status: Not on file  . Intimate partner violence:    Fear of current or ex partner: Not on file    Emotionally abused: Not on file    Physically abused: Not on file    Forced sexual activity:  Not on file  Other Topics Concern  . Not on file  Social History Narrative  . Not on file    Family History  Problem Relation Age of Onset  . Cancer Mother        lymphoma and lung  . Cancer Father        stomach/pancreatic     BP 138/86   Pulse 100   Ht 5\' 1"  (1.549 m)   Wt 235 lb (106.6 kg)   BMI 44.40 kg/m   Body mass index is 44.4 kg/m.     Objective:   Physical Exam Constitutional:      Appearance: She is well-developed.  HENT:     Head: Normocephalic and atraumatic.  Eyes:     Conjunctiva/sclera: Conjunctivae normal.     Pupils: Pupils are equal, round, and reactive to light.  Neck:     Musculoskeletal: Normal range of motion and neck supple.  Cardiovascular:     Rate and Rhythm: Normal rate and regular rhythm.  Pulmonary:     Effort: Pulmonary effort is normal.  Abdominal:     Palpations: Abdomen is soft.  Musculoskeletal:     Right knee: She exhibits decreased range of motion and effusion. Tenderness found. Medial joint line tenderness noted.       Legs:  Skin:    General: Skin is warm and dry.  Neurological:     Mental Status: She is alert and oriented to person, place, and time.     Cranial Nerves: No cranial nerve deficit.     Motor: No abnormal muscle tone.     Coordination: Coordination normal.     Deep Tendon Reflexes: Reflexes are normal and symmetric. Reflexes normal.  Psychiatric:        Behavior: Behavior normal.        Thought Content: Thought content normal.        Judgment: Judgment normal.      X-rays were done of the right knee, reported separately.     Assessment & Plan:   Encounter Diagnosis  Name Primary?  . Acute pain of right knee Yes   I am concerned about a possible medial meniscus tear.  She has to wait six week from being seen for her insurance to approve a MRI of the knee.  I have told her this.  PROCEDURE NOTE:  The patient requests injections of the right knee , verbal consent was obtained.  The right  knee was prepped appropriately after time out was performed.   Sterile technique was observed and injection of 1 cc of Depo-Medrol 40 mg with  several cc's of plain xylocaine. Anesthesia was provided by ethyl chloride and a 20-gauge needle was used to inject the knee area. The injection was tolerated well.  A band aid dressing was applied.  The patient was advised to apply ice later today and tomorrow to the injection sight as needed.  Continue the ibuprofen.  Return in two weeks.  Call if any problem.  Precautions discussed.   Electronically Signed Darreld McleanWayne Burrell Hodapp, MD 1/21/20208:46 AM

## 2018-04-15 NOTE — Addendum Note (Signed)
Addended by: Baird Kay on: 04/15/2018 11:52 AM   Modules accepted: Orders

## 2018-04-15 NOTE — Telephone Encounter (Signed)
Deborah Bond was seen this morning and was told she would have to wait 6 weeks in order to get MRI approval.  She said she spoke to her [employer] and she does not have a waiting period but she did think that we would need to call for pre approval.  She wants to proceed with getting this scheduled if possible.  Thanks

## 2018-04-17 ENCOUNTER — Telehealth: Payer: Self-pay | Admitting: Orthopaedic Surgery

## 2018-04-17 MED ORDER — HYDROCODONE-ACETAMINOPHEN 5-325 MG PO TABS: ORAL_TABLET | ORAL | 0 refills | Status: DC

## 2018-04-17 NOTE — Telephone Encounter (Signed)
Patient called asking if you could prescribe her something for her pain. The Ibuprofen 800 mg is not helping. States the shot she got on Tuesday did not help either. If you can't prescribe anything what do you suggest for her to take OTC. States at work she is in a lot of pain, not sleeping either.  PATIENT USES THE DRUG STORE IN [place]

## 2018-04-18 NOTE — Telephone Encounter (Signed)
Pt notified that MRI was submitted on the web site and was not approved at this time. Let her know the justification for it not meeting criteria. Pt verbalized understanding.

## 2018-04-29 ENCOUNTER — Ambulatory Visit: Payer: BC Managed Care – PPO | Admitting: Orthopaedic Surgery

## 2018-04-29 ENCOUNTER — Ambulatory Visit: Payer: BC Managed Care – PPO | Admitting: Physician Assistant

## 2018-05-19 ENCOUNTER — Ambulatory Visit: Payer: BC Managed Care – PPO | Admitting: Physician Assistant

## 2018-05-19 ENCOUNTER — Encounter: Payer: Self-pay | Admitting: Physician Assistant

## 2018-05-19 DIAGNOSIS — R635 Abnormal weight gain: Secondary | ICD-10-CM

## 2018-05-19 MED ORDER — PHENTERMINE HCL 37.5 MG PO TABS: 37.5000 mg | ORAL_TABLET | Freq: Every day | ORAL | 1 refills | Status: DC

## 2018-05-19 NOTE — Progress Notes (Signed)
BP 127/64   Pulse 71   Temp (!) 97.2 F (36.2 C) (Oral)   Ht 5\' 1"  (1.549 m)   Wt 231 lb (104.8 kg)   BMI 43.65 kg/m    Subjective:    Patient ID: Deborah Bond, female    DOB: 1961/03/03, 58 y.o.   MRN: [id number]  HPI: Deborah Bond is a 58 y.o. female presenting on 05/19/2018 for Discuss weight gain  This patient comes in for periodic recheck on medications and conditions including weight loss efforts, she has been doing well. She is down 10 pounds since December, She had to stop the medication because she will be having knee surgery.  She has had much less exercise.   All medications are reviewed today. There are no reports of any problems with the medications. All of the medical conditions are reviewed and updated.  Lab work is reviewed and will be ordered as medically necessary. There are no new problems reported with today's visit.   Past Medical History:  Diagnosis Date  . Hyperlipidemia   . Thyroid disease    Relevant past medical, surgical, family and social history reviewed and updated as indicated. Interim medical history since our last visit reviewed. Allergies and medications reviewed and updated. DATA REVIEWED: CHART IN EPIC  Family History reviewed for pertinent findings.  Review of Systems  Constitutional: Negative.   HENT: Negative.   Eyes: Negative.   Respiratory: Negative.   Gastrointestinal: Negative.   Genitourinary: Negative.     Allergies as of 05/19/2018   No Known Allergies     Medication List       Accurate as of May 19, 2018  9:33 PM. Always use your most recent med list.        atorvastatin 20 MG tablet Commonly known as:  LIPITOR TAKE ONE (1) TABLET EACH DAY   CALCIUM 600+D 600-200 MG-UNIT Tabs Generic drug:  Calcium Carbonate-Vitamin D Take by mouth.   cyclobenzaprine 10 MG tablet Commonly known as:  FLEXERIL Take 1 tablet (10 mg total) by mouth 3 (three) times daily as needed for muscle spasms.     escitalopram 20 MG tablet Commonly known as:  LEXAPRO Take 1 tablet (20 mg total) by mouth daily.   gabapentin 300 MG capsule Commonly known as:  NEURONTIN Take 1-3 capsules (300-900 mg total) by mouth at bedtime.   HYDROcodone-acetaminophen 5-325 MG tablet Commonly known as:  NORCO/VICODIN One tablet every four hours as needed for acute pain.  Limit of five days per [place].   ibuprofen 800 MG tablet Commonly known as:  ADVIL,MOTRIN TAKE ONE TABLET 3 TIMES A DAY AS NEEDED.   levothyroxine 125 MCG tablet Commonly known as:  SYNTHROID, LEVOTHROID TAKE ONE (1) TABLET EACH DAY   meloxicam 7.5 MG tablet Commonly known as:  MOBIC Take 1 tablet (7.5 mg total) by mouth daily.   phentermine 37.5 MG tablet Commonly known as:  ADIPEX-P Take 1 tablet (37.5 mg total) by mouth daily before breakfast.   UNABLE TO FIND Med Name: Amanda Cockayne Complete          Objective:    BP 127/64   Pulse 71   Temp (!) 97.2 F (36.2 C) (Oral)   Ht 5\' 1"  (1.549 m)   Wt 231 lb (104.8 kg)   BMI 43.65 kg/m   No Known Allergies  Wt Readings from Last 3 Encounters:  05/19/18 231 lb (104.8 kg)  04/15/18 235 lb (106.6 kg)  03/17/18 241 lb (  109.3 kg)    Physical Exam Constitutional:      Appearance: She is well-developed.  HENT:     Head: Normocephalic and atraumatic.  Eyes:     Conjunctiva/sclera: Conjunctivae normal.     Pupils: Pupils are equal, round, and reactive to light.  Cardiovascular:     Rate and Rhythm: Normal rate and regular rhythm.     Heart sounds: Normal heart sounds.  Pulmonary:     Effort: Pulmonary effort is normal.     Breath sounds: Normal breath sounds.  Skin:    General: Skin is warm and dry.     Findings: No rash.  Neurological:     Mental Status: She is alert.     Deep Tendon Reflexes: Reflexes are normal and symmetric.         Assessment & Plan:   1. Weight gain - phentermine (ADIPEX-P) 37.5 MG tablet; Take 1 tablet (37.5 mg total) by mouth  daily before breakfast.  Dispense: 30 tablet; Refill: 1   Continue all other maintenance medications as listed above.  Follow up plan: Return in about 3 months (around 08/17/2018).  Educational handout given for survey  Remus Loffler PA-C [facility] [address]  [place], [place] [address] [phone]   05/19/2018, 9:33 PM

## 2018-05-30 ENCOUNTER — Telehealth (HOSPITAL_COMMUNITY): Payer: Self-pay | Admitting: Physical Therapy

## 2018-05-30 NOTE — Telephone Encounter (Signed)
S/w Sue Lush at Dr Thomasena Edis office requested that a new referral be sent - the one rec'd had no diagnosis on it. Waiting on correct referral. NF 05/30/2018

## 2018-06-17 ENCOUNTER — Telehealth (HOSPITAL_COMMUNITY): Payer: Self-pay

## 2018-06-17 NOTE — Telephone Encounter (Signed)
Called and spoke to pt regarding our clinic being closed until at least April 6 due to COVID-19. DOS was 05/22/18. PT inquired about her current exercises and if she felt comfortable with everything thus far and she stated that she did. PT explained that if she felt like she wasn't pleased with her progress and needed to get in sooner before April 6, that she could call and our clinic would potentially try and work her in and she verbalized understanding. Pt wished to be called once we reopen to get her evaluation scheduled.   Jac Canavan PT, DPT

## 2018-08-19 ENCOUNTER — Other Ambulatory Visit: Payer: Self-pay

## 2018-08-20 ENCOUNTER — Encounter: Payer: Self-pay | Admitting: Physician Assistant

## 2018-08-20 ENCOUNTER — Ambulatory Visit: Payer: BC Managed Care – PPO | Admitting: Physician Assistant

## 2018-08-20 VITALS — BP 118/67 | HR 80 | Temp 98.4°F | Ht 61.0 in | Wt 228.6 lb

## 2018-08-20 DIAGNOSIS — M5136 Other intervertebral disc degeneration, lumbar region: Secondary | ICD-10-CM

## 2018-08-20 DIAGNOSIS — E039 Hypothyroidism, unspecified: Secondary | ICD-10-CM

## 2018-08-20 DIAGNOSIS — R635 Abnormal weight gain: Secondary | ICD-10-CM

## 2018-08-20 DIAGNOSIS — G2581 Restless legs syndrome: Secondary | ICD-10-CM | POA: Diagnosis not present

## 2018-08-20 DIAGNOSIS — F321 Major depressive disorder, single episode, moderate: Secondary | ICD-10-CM | POA: Diagnosis not present

## 2018-08-20 DIAGNOSIS — Z0000 Encounter for general adult medical examination without abnormal findings: Secondary | ICD-10-CM

## 2018-08-20 MED ORDER — IBUPROFEN 800 MG PO TABS: ORAL_TABLET | ORAL | 5 refills | Status: AC

## 2018-08-20 MED ORDER — ESCITALOPRAM OXALATE 20 MG PO TABS: 20.0000 mg | ORAL_TABLET | Freq: Every day | ORAL | 3 refills | Status: AC

## 2018-08-20 MED ORDER — LEVOTHYROXINE SODIUM 125 MCG PO TABS: ORAL_TABLET | ORAL | 3 refills | Status: DC

## 2018-08-20 MED ORDER — PHENTERMINE HCL 37.5 MG PO TABS: 37.5000 mg | ORAL_TABLET | Freq: Every day | ORAL | 2 refills | Status: DC

## 2018-08-20 MED ORDER — ROPINIROLE HCL 0.25 MG PO TABS: 0.2500 mg | ORAL_TABLET | Freq: Every day | ORAL | 5 refills | Status: DC

## 2018-08-20 NOTE — Patient Instructions (Signed)

## 2018-08-21 LAB — CMP14+EGFR
ALT: 15 IU/L (ref 0–32)
AST: 18 IU/L (ref 0–40)
Albumin/Globulin Ratio: 2 (ref 1.2–2.2)
Albumin: 4.4 g/dL (ref 3.8–4.9)
Alkaline Phosphatase: 126 IU/L — ABNORMAL HIGH (ref 39–117)
BUN/Creatinine Ratio: 27 — ABNORMAL HIGH (ref 9–23)
BUN: 25 mg/dL — ABNORMAL HIGH (ref 6–24)
Bilirubin Total: 0.3 mg/dL (ref 0.0–1.2)
CO2: 23 mmol/L (ref 20–29)
Calcium: 9.1 mg/dL (ref 8.7–10.2)
Chloride: 103 mmol/L (ref 96–106)
Creatinine, Ser: 0.93 mg/dL (ref 0.57–1.00)
GFR calc Af Amer: 78 mL/min/{1.73_m2} (ref >59)
GFR calc non Af Amer: 68 mL/min/{1.73_m2} (ref >59)
Globulin, Total: 2.2 g/dL (ref 1.5–4.5)
Glucose: 111 mg/dL — ABNORMAL HIGH (ref 65–99)
Potassium: 4.2 mmol/L (ref 3.5–5.2)
Sodium: 141 mmol/L (ref 134–144)
Total Protein: 6.6 g/dL (ref 6.0–8.5)

## 2018-08-21 LAB — CBC WITH DIFFERENTIAL/PLATELET
Basophils Absolute: 0.1 10*3/uL (ref 0.0–0.2)
Basos: 1 %
EOS (ABSOLUTE): 0.2 10*3/uL (ref 0.0–0.4)
Eos: 3 %
Hematocrit: 41.4 % (ref 34.0–46.6)
Hemoglobin: 13.6 g/dL (ref 11.1–15.9)
Immature Grans (Abs): 0 10*3/uL (ref 0.0–0.1)
Immature Granulocytes: 0 %
Lymphocytes Absolute: 1.7 10*3/uL (ref 0.7–3.1)
Lymphs: 29 %
MCH: 29.8 pg (ref 26.6–33.0)
MCHC: 32.9 g/dL (ref 31.5–35.7)
MCV: 91 fL (ref 79–97)
Monocytes Absolute: 0.6 10*3/uL (ref 0.1–0.9)
Monocytes: 10 %
Neutrophils Absolute: 3.4 10*3/uL (ref 1.4–7.0)
Neutrophils: 57 %
Platelets: 329 10*3/uL (ref 150–450)
RBC: 4.56 x10E6/uL (ref 3.77–5.28)
RDW: 12 % (ref 11.7–15.4)
WBC: 6 10*3/uL (ref 3.4–10.8)

## 2018-08-21 LAB — LIPID PANEL
Chol/HDL Ratio: 3.6 ratio (ref 0.0–4.4)
Cholesterol, Total: 162 mg/dL (ref 100–199)
HDL: 45 mg/dL (ref >39)
LDL Calculated: 89 mg/dL (ref 0–99)
Triglycerides: 142 mg/dL (ref 0–149)
VLDL Cholesterol Cal: 28 mg/dL (ref 5–40)

## 2018-08-21 LAB — TSH: TSH: 0.235 u[IU]/mL — ABNORMAL LOW (ref 0.450–4.500)

## 2018-08-22 ENCOUNTER — Other Ambulatory Visit: Payer: Self-pay | Admitting: Physician Assistant

## 2018-08-22 DIAGNOSIS — E039 Hypothyroidism, unspecified: Secondary | ICD-10-CM

## 2018-08-22 MED ORDER — LEVOTHYROXINE SODIUM 100 MCG PO TABS: ORAL_TABLET | ORAL | 1 refills | Status: DC

## 2018-08-26 DIAGNOSIS — F321 Major depressive disorder, single episode, moderate: Secondary | ICD-10-CM | POA: Insufficient documentation

## 2018-08-26 NOTE — Progress Notes (Signed)
BP 118/67   Pulse 80   Temp 98.4 F (36.9 C) (Oral)   Ht 5' 1"  (1.549 m)   Wt 228 lb 9.6 oz (103.7 kg)   BMI 43.19 kg/m    Subjective:    Patient ID: Deborah Bond, female    DOB: 03/22/1961, 58 y.o.   MRN: [id number]  HPI: Deborah Bond is a 58 y.o. female presenting on 08/20/2018 for Medical Management of Chronic Issues (3 month follow up)  Patient is here for follow-up on her chronic medical conditions. Hypothyroidism: She states that she is doing well and not having any symptoms at this time.  Her weight has been steady and she has been able to lose a little bit with the efforts that she is having.  She is having difficulty with her knee and had arthroscopic surgery only.  Degenerative disc disease: Patient needs to have refills for her medications she states that at this time she is doing fairly well with her back and not having any sciatica pain at all. Her depression is present am more pronounced because of the COVID-19 restrictions.  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  She is having movement of her legs at night and this is causing her to have poor sleep at times.  Her husband states that she constantly is kicking.  She has never taken medication for restless legs.  She is still using phentermine for appetite suppression and has had her weight stays stable.  Past Medical History:  Diagnosis Date  . Hyperlipidemia   . Thyroid disease    Relevant past medical, surgical, family and social history reviewed and updated as indicated. Interim medical history since our last visit reviewed. Allergies and medications reviewed and updated. DATA REVIEWED: CHART IN EPIC  Family History reviewed for pertinent findings.  Review of Systems  Constitutional: Negative.   HENT: Negative.   Eyes: Negative.   Respiratory: Negative.   Gastrointestinal: Negative.   Genitourinary: Negative.   Musculoskeletal: Positive for arthralgias, back pain  and joint swelling.  Psychiatric/Behavioral: Positive for dysphoric mood and sleep disturbance. The patient is nervous/anxious.     Allergies as of 08/20/2018   No Known Allergies     Medication List       Accurate as of Aug 20, 2018 11:59 PM. If you have any questions, ask your nurse or doctor.        STOP taking these medications   gabapentin 300 MG capsule Commonly known as:  NEURONTIN Stopped by:  Terald Sleeper, PA-C   HYDROcodone-acetaminophen 5-325 MG tablet Commonly known as:  NORCO/VICODIN Stopped by:  Terald Sleeper, PA-C   meloxicam 7.5 MG tablet Commonly known as:  MOBIC Stopped by:  Terald Sleeper, PA-C     TAKE these medications   atorvastatin 20 MG tablet Commonly known as:  LIPITOR TAKE ONE (1) TABLET EACH DAY   Calcium 600+D 600-200 MG-UNIT Tabs Generic drug:  Calcium Carbonate-Vitamin D Take by mouth.   cyclobenzaprine 10 MG tablet Commonly known as:  FLEXERIL Take 1 tablet (10 mg total) by mouth 3 (three) times daily as needed for muscle spasms.   escitalopram 20 MG tablet Commonly known as:  LEXAPRO Take 1 tablet (20 mg total) by mouth daily.   ibuprofen 800 MG tablet Commonly known as:  ADVIL TAKE ONE TABLET 3 TIMES A DAY AS NEEDED.   levothyroxine 125 MCG tablet Commonly known as:  SYNTHROID  TAKE ONE (1) TABLET EACH DAY   phentermine 37.5 MG tablet Commonly known as:  ADIPEX-P Take 1 tablet (37.5 mg total) by mouth daily before breakfast.   rOPINIRole 0.25 MG tablet Commonly known as:  Requip Take 1-4 tablets (0.25-1 mg total) by mouth at bedtime. Started by:  Terald Sleeper, PA-C   UNABLE TO FIND Med Name: Hervey Ard Complete          Objective:    BP 118/67   Pulse 80   Temp 98.4 F (36.9 C) (Oral)   Ht 5' 1"  (1.549 m)   Wt 228 lb 9.6 oz (103.7 kg)   BMI 43.19 kg/m   No Known Allergies  Wt Readings from Last 3 Encounters:  08/20/18 228 lb 9.6 oz (103.7 kg)  05/19/18 231 lb (104.8 kg)  04/15/18 235 lb (106.6 kg)     Physical Exam Constitutional:      Appearance: She is well-developed.  HENT:     Head: Normocephalic and atraumatic.  Eyes:     Conjunctiva/sclera: Conjunctivae normal.     Pupils: Pupils are equal, round, and reactive to light.  Cardiovascular:     Rate and Rhythm: Normal rate and regular rhythm.     Heart sounds: Normal heart sounds.  Pulmonary:     Effort: Pulmonary effort is normal.     Breath sounds: Normal breath sounds.  Abdominal:     General: Bowel sounds are normal.     Palpations: Abdomen is soft.  Skin:    General: Skin is warm and dry.     Findings: No rash.  Neurological:     Mental Status: She is alert and oriented to person, place, and time.     Deep Tendon Reflexes: Reflexes are normal and symmetric.  Psychiatric:        Behavior: Behavior normal.        Thought Content: Thought content normal.        Judgment: Judgment normal.     Results for orders placed or performed in visit on 08/20/18  CMP14+EGFR  Result Value Ref Range   Glucose 111 (H) 65 - 99 mg/dL   BUN 25 (H) 6 - 24 mg/dL   Creatinine, Ser 0.93 0.57 - 1.00 mg/dL   GFR calc non Af Amer 68 >59 mL/min/1.73   GFR calc Af Amer 78 >59 mL/min/1.73   BUN/Creatinine Ratio 27 (H) 9 - 23   Sodium 141 134 - 144 mmol/L   Potassium 4.2 3.5 - 5.2 mmol/L   Chloride 103 96 - 106 mmol/L   CO2 23 20 - 29 mmol/L   Calcium 9.1 8.7 - 10.2 mg/dL   Total Protein 6.6 6.0 - 8.5 g/dL   Albumin 4.4 3.8 - 4.9 g/dL   Globulin, Total 2.2 1.5 - 4.5 g/dL   Albumin/Globulin Ratio 2.0 1.2 - 2.2   Bilirubin Total 0.3 0.0 - 1.2 mg/dL   Alkaline Phosphatase 126 (H) 39 - 117 IU/L   AST 18 0 - 40 IU/L   ALT 15 0 - 32 IU/L  CBC with Differential/Platelet  Result Value Ref Range   WBC 6.0 3.4 - 10.8 x10E3/uL   RBC 4.56 3.77 - 5.28 x10E6/uL   Hemoglobin 13.6 11.1 - 15.9 g/dL   Hematocrit 41.4 34.0 - 46.6 %   MCV 91 79 - 97 fL   MCH 29.8 26.6 - 33.0 pg   MCHC 32.9 31.5 - 35.7 g/dL   RDW 12.0 11.7 - 15.4 %   Platelets  329  150 - 450 x10E3/uL   Neutrophils 57 Not Estab. %   Lymphs 29 Not Estab. %   Monocytes 10 Not Estab. %   Eos 3 Not Estab. %   Basos 1 Not Estab. %   Neutrophils Absolute 3.4 1.4 - 7.0 x10E3/uL   Lymphocytes Absolute 1.7 0.7 - 3.1 x10E3/uL   Monocytes Absolute 0.6 0.1 - 0.9 x10E3/uL   EOS (ABSOLUTE) 0.2 0.0 - 0.4 x10E3/uL   Basophils Absolute 0.1 0.0 - 0.2 x10E3/uL   Immature Granulocytes 0 Not Estab. %   Immature Grans (Abs) 0.0 0.0 - 0.1 x10E3/uL  Lipid panel  Result Value Ref Range   Cholesterol, Total 162 100 - 199 mg/dL   Triglycerides 142 0 - 149 mg/dL   HDL 45 >39 mg/dL   VLDL Cholesterol Cal 28 5 - 40 mg/dL   LDL Calculated 89 0 - 99 mg/dL   Chol/HDL Ratio 3.6 0.0 - 4.4 ratio  TSH  Result Value Ref Range   TSH 0.235 (L) 0.450 - 4.500 uIU/mL      Assessment & Plan:   1. Depression, major, single episode, moderate (HCC) - escitalopram (LEXAPRO) 20 MG tablet; Take 1 tablet (20 mg total) by mouth daily.  Dispense: 90 tablet; Refill: 3  2. Acquired hypothyroidism - TSH  3. DDD (degenerative disc disease), lumbar - ibuprofen (ADVIL) 800 MG tablet; TAKE ONE TABLET 3 TIMES A DAY AS NEEDED.  Dispense: 90 tablet; Refill: 5  4. Restless legs - rOPINIRole (REQUIP) 0.25 MG tablet; Take 1-4 tablets (0.25-1 mg total) by mouth at bedtime.  Dispense: 120 tablet; Refill: 5  5. Weight gain - phentermine (ADIPEX-P) 37.5 MG tablet; Take 1 tablet (37.5 mg total) by mouth daily before breakfast.  Dispense: 30 tablet; Refill: 2  6. Well adult exam  - CMP14+EGFR - CBC with Differential/Platelet - Lipid panel - TSH   Continue all other maintenance medications as listed above.  Follow up plan: No follow-ups on file.  Educational handout given for [place] PA-C [facility] [address]  [place], [place] [address] [phone]   08/26/2018, 9:00 AM

## 2018-08-27 ENCOUNTER — Telehealth (HOSPITAL_COMMUNITY): Payer: Self-pay

## 2018-08-27 NOTE — Telephone Encounter (Signed)
Called pt she has been doing her exercises at home and doesn't need PT now. Patient requested that we close this referral.

## 2018-10-22 ENCOUNTER — Other Ambulatory Visit (HOSPITAL_COMMUNITY): Payer: Self-pay | Admitting: Physician Assistant

## 2018-10-22 DIAGNOSIS — Z1231 Encounter for screening mammogram for malignant neoplasm of breast: Secondary | ICD-10-CM

## 2018-10-30 ENCOUNTER — Ambulatory Visit (HOSPITAL_COMMUNITY)
Admission: RE | Admit: 2018-10-30 | Discharge: 2018-10-30 | Disposition: A | Discharge status: Home / Self Care | Payer: BC Managed Care – PPO | Source: Ambulatory Visit | Attending: Physician Assistant | Admitting: Physician Assistant

## 2018-10-30 ENCOUNTER — Other Ambulatory Visit: Payer: Self-pay

## 2018-10-30 DIAGNOSIS — Z1231 Encounter for screening mammogram for malignant neoplasm of breast: Secondary | ICD-10-CM | POA: Diagnosis present

## 2018-11-17 ENCOUNTER — Ambulatory Visit: Payer: BC Managed Care – PPO | Admitting: Physician Assistant

## 2018-11-18 ENCOUNTER — Other Ambulatory Visit: Payer: Self-pay | Admitting: Physician Assistant

## 2018-11-18 DIAGNOSIS — R635 Abnormal weight gain: Secondary | ICD-10-CM

## 2018-11-24 ENCOUNTER — Ambulatory Visit: Payer: BC Managed Care – PPO | Admitting: Physician Assistant

## 2018-12-08 ENCOUNTER — Other Ambulatory Visit: Payer: Self-pay

## 2018-12-09 ENCOUNTER — Ambulatory Visit: Payer: BC Managed Care – PPO | Admitting: Physician Assistant

## 2018-12-09 ENCOUNTER — Encounter: Payer: Self-pay | Admitting: Physician Assistant

## 2018-12-09 VITALS — BP 112/74 | HR 66 | Temp 98.2°F | Ht 61.0 in | Wt 230.8 lb

## 2018-12-09 DIAGNOSIS — Z23 Encounter for immunization: Secondary | ICD-10-CM | POA: Diagnosis not present

## 2018-12-09 DIAGNOSIS — E7800 Pure hypercholesterolemia, unspecified: Secondary | ICD-10-CM

## 2018-12-09 DIAGNOSIS — E039 Hypothyroidism, unspecified: Secondary | ICD-10-CM

## 2018-12-09 DIAGNOSIS — Z0000 Encounter for general adult medical examination without abnormal findings: Secondary | ICD-10-CM

## 2018-12-09 LAB — BAYER DCA HB A1C WAIVED: HB A1C (BAYER DCA - WAIVED): 5.7 % (ref <7.0)

## 2018-12-09 MED ORDER — DICLOFENAC SODIUM 1 % TD GEL: 2.0000 g | Freq: Four times a day (QID) | TRANSDERMAL | 3 refills | Status: AC

## 2018-12-09 MED ORDER — ATORVASTATIN CALCIUM 20 MG PO TABS: ORAL_TABLET | ORAL | 3 refills | Status: AC

## 2018-12-09 MED ORDER — LEVOTHYROXINE SODIUM 100 MCG PO TABS: ORAL_TABLET | ORAL | 3 refills | Status: AC

## 2018-12-09 NOTE — Progress Notes (Signed)
BP 112/74   Pulse 66   Temp 98.2 F (36.8 C) (Temporal)   Ht 5' 1"  (1.549 m)   Wt 230 lb 12.8 oz (104.7 kg)   SpO2 97%   BMI 43.61 kg/m    Subjective:    Patient ID: Deborah Bond, female    DOB: 1960-08-25, 58 y.o.   MRN: [id number]  HPI: Deborah Bond is a 58 y.o. 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  We reviewed all of the patient's medications and she does need some refills.  She states that overall she seems to be doing fairly well and stable she does need to have some labs done today and updated on her vaccines.  She has no other complaints at this time.  Past Medical History:  Diagnosis Date  . Depression   . Hyperlipidemia   . Thyroid disease    Relevant past medical, surgical, family and social history reviewed and updated as indicated. Interim medical history since our last visit reviewed. Allergies and medications reviewed and updated. DATA REVIEWED: CHART IN EPIC  Family History reviewed for pertinent findings.  Review of Systems  Constitutional: Negative.   HENT: Negative.   Eyes: Negative.   Respiratory: Negative.   Gastrointestinal: Negative.   Genitourinary: Negative.   Musculoskeletal: Positive for arthralgias, back pain, joint swelling and myalgias.  Neurological: Positive for tremors.  Hematological: Negative.   Psychiatric/Behavioral: Negative.     Allergies as of 12/09/2018   No Known Allergies     Medication List       Accurate as of December 09, 2018 11:59 PM. If you have any questions, ask your nurse or doctor.        STOP taking these medications   cyclobenzaprine 10 MG tablet Commonly known as: FLEXERIL Stopped by: Terald Sleeper, PA-C   phentermine 37.5 MG tablet Commonly  known as: ADIPEX-P Stopped by: Terald Sleeper, PA-C   rOPINIRole 0.25 MG tablet Commonly known as: Requip Stopped by: Terald Sleeper, PA-C     TAKE these medications   aspirin EC 81 MG tablet Take 81 mg by mouth daily.   atorvastatin 20 MG tablet Commonly known as: LIPITOR TAKE ONE (1) TABLET EACH DAY   Calcium 600+D 600-200 MG-UNIT Tabs Generic drug: Calcium Carbonate-Vitamin D Take by mouth.   diclofenac sodium 1 % Gel Commonly known as: VOLTAREN Apply 2 g topically 4 (four) times daily. Started by: Terald Sleeper, PA-C   escitalopram 20 MG tablet Commonly known as: LEXAPRO Take 1 tablet (20 mg total) by mouth daily.   ibuprofen 800 MG tablet Commonly known as: ADVIL TAKE ONE TABLET 3 TIMES A DAY AS NEEDED.   levothyroxine 100 MCG tablet Commonly known as: SYNTHROID TAKE ONE (1) TABLET EACH DAY   UNABLE TO FIND Med Name: Hervey Ard Complete          Objective:    BP 112/74   Pulse 66   Temp 98.2 F (36.8 C) (Temporal)   Ht 5' 1"  (1.549 m)   Wt 230 lb 12.8 oz (104.7 kg)   SpO2 97%   BMI 43.61 kg/m   No Known Allergies  Wt Readings from Last 3 Encounters:  12/09/18 230 lb 12.8 oz (104.7 kg)  08/20/18 228 lb 9.6 oz (103.7  kg)  05/19/18 231 lb (104.8 kg)    Physical Exam Constitutional:      Appearance: She is well-developed.  HENT:     Head: Normocephalic and atraumatic.     Right Ear: Tympanic membrane, ear canal and external ear normal.     Left Ear: Tympanic membrane, ear canal and external ear normal.     Nose: Nose normal. No rhinorrhea.     Mouth/Throat:     Pharynx: No oropharyngeal exudate or posterior oropharyngeal erythema.  Eyes:     Conjunctiva/sclera: Conjunctivae normal.     Pupils: Pupils are equal, round, and reactive to light.  Neck:     Musculoskeletal: Normal range of motion and neck supple.  Cardiovascular:     Rate and Rhythm: Normal rate and regular rhythm.     Heart sounds: Normal heart sounds.  Pulmonary:     Effort:  Pulmonary effort is normal.     Breath sounds: Normal breath sounds.  Abdominal:     General: Bowel sounds are normal.     Palpations: Abdomen is soft.  Skin:    General: Skin is warm and dry.     Findings: No rash.  Neurological:     Mental Status: She is alert and oriented to person, place, and time.     Deep Tendon Reflexes: Reflexes are normal and symmetric.  Psychiatric:        Behavior: Behavior normal.        Thought Content: Thought content normal.        Judgment: Judgment normal.     Results for orders placed or performed in visit on 12/09/18  CBC with Differential/Platelet  Result Value Ref Range   WBC 8.1 3.4 - 10.8 x10E3/uL   RBC 4.68 3.77 - 5.28 x10E6/uL   Hemoglobin 13.8 11.1 - 15.9 g/dL   Hematocrit 42.1 34.0 - 46.6 %   MCV 90 79 - 97 fL   MCH 29.5 26.6 - 33.0 pg   MCHC 32.8 31.5 - 35.7 g/dL   RDW 13.3 11.7 - 15.4 %   Platelets 340 150 - 450 x10E3/uL   Neutrophils 59 Not Estab. %   Lymphs 27 Not Estab. %   Monocytes 10 Not Estab. %   Eos 3 Not Estab. %   Basos 1 Not Estab. %   Neutrophils Absolute 4.8 1.4 - 7.0 x10E3/uL   Lymphocytes Absolute 2.2 0.7 - 3.1 x10E3/uL   Monocytes Absolute 0.8 0.1 - 0.9 x10E3/uL   EOS (ABSOLUTE) 0.2 0.0 - 0.4 x10E3/uL   Basophils Absolute 0.1 0.0 - 0.2 x10E3/uL   Immature Granulocytes 0 Not Estab. %   Immature Grans (Abs) 0.0 0.0 - 0.1 x10E3/uL  CMP14+EGFR  Result Value Ref Range   Glucose 83 65 - 99 mg/dL   BUN 19 6 - 24 mg/dL   Creatinine, Ser 0.93 0.57 - 1.00 mg/dL   GFR calc non Af Amer 68 >59 mL/min/1.73   GFR calc Af Amer 78 >59 mL/min/1.73   BUN/Creatinine Ratio 20 9 - 23   Sodium 142 134 - 144 mmol/L   Potassium 4.4 3.5 - 5.2 mmol/L   Chloride 105 96 - 106 mmol/L   CO2 24 20 - 29 mmol/L   Calcium 9.3 8.7 - 10.2 mg/dL   Total Protein 6.6 6.0 - 8.5 g/dL   Albumin 4.1 3.8 - 4.9 g/dL   Globulin, Total 2.5 1.5 - 4.5 g/dL   Albumin/Globulin Ratio 1.6 1.2 - 2.2   Bilirubin Total 0.3 0.0 -  1.2 mg/dL   Alkaline  Phosphatase 130 (H) 39 - 117 IU/L   AST 15 0 - 40 IU/L   ALT 17 0 - 32 IU/L  Lipid panel  Result Value Ref Range   Cholesterol, Total 179 100 - 199 mg/dL   Triglycerides 136 0 - 149 mg/dL   HDL 52 >39 mg/dL   VLDL Cholesterol Cal 24 5 - 40 mg/dL   LDL Chol Calc (NIH) 103 (H) 0 - 99 mg/dL   Chol/HDL Ratio 3.4 0.0 - 4.4 ratio  TSH  Result Value Ref Range   TSH 1.990 0.450 - 4.500 uIU/mL  Bayer DCA Hb A1c Waived  Result Value Ref Range   HB A1C (BAYER DCA - WAIVED) 5.7 <7.0 %      Assessment & Plan:   1. Pure hypercholesterolemia - atorvastatin (LIPITOR) 20 MG tablet; TAKE ONE (1) TABLET EACH DAY  Dispense: 90 tablet; Refill: 3 - Lipid panel  2. Acquired hypothyroidism - levothyroxine (SYNTHROID) 100 MCG tablet; TAKE ONE (1) TABLET EACH DAY  Dispense: 90 tablet; Refill: 3 - TSH  3. Well adult exam - CBC with Differential/Platelet - CMP14+EGFR - Lipid panel - TSH - Bayer DCA Hb A1c Waived - Tdap vaccine greater than or equal to 7yo IM  4. Need for immunization against influenza - Flu Vaccine QUAD 36+ mos IM   Continue all other maintenance medications as listed above.  Follow up plan: No follow-ups on file.  Educational handout given for [place] PA-C [facility] [address]  [place], [place] [address] [phone]   12/11/2018, 7:51 PM

## 2018-12-10 LAB — CBC WITH DIFFERENTIAL/PLATELET
Basophils Absolute: 0.1 10*3/uL (ref 0.0–0.2)
Basos: 1 %
EOS (ABSOLUTE): 0.2 10*3/uL (ref 0.0–0.4)
Eos: 3 %
Hematocrit: 42.1 % (ref 34.0–46.6)
Hemoglobin: 13.8 g/dL (ref 11.1–15.9)
Immature Grans (Abs): 0 10*3/uL (ref 0.0–0.1)
Immature Granulocytes: 0 %
Lymphocytes Absolute: 2.2 10*3/uL (ref 0.7–3.1)
Lymphs: 27 %
MCH: 29.5 pg (ref 26.6–33.0)
MCHC: 32.8 g/dL (ref 31.5–35.7)
MCV: 90 fL (ref 79–97)
Monocytes Absolute: 0.8 10*3/uL (ref 0.1–0.9)
Monocytes: 10 %
Neutrophils Absolute: 4.8 10*3/uL (ref 1.4–7.0)
Neutrophils: 59 %
Platelets: 340 10*3/uL (ref 150–450)
RBC: 4.68 x10E6/uL (ref 3.77–5.28)
RDW: 13.3 % (ref 11.7–15.4)
WBC: 8.1 10*3/uL (ref 3.4–10.8)

## 2018-12-10 LAB — CMP14+EGFR
ALT: 17 IU/L (ref 0–32)
AST: 15 IU/L (ref 0–40)
Albumin/Globulin Ratio: 1.6 (ref 1.2–2.2)
Albumin: 4.1 g/dL (ref 3.8–4.9)
Alkaline Phosphatase: 130 IU/L — ABNORMAL HIGH (ref 39–117)
BUN/Creatinine Ratio: 20 (ref 9–23)
BUN: 19 mg/dL (ref 6–24)
Bilirubin Total: 0.3 mg/dL (ref 0.0–1.2)
CO2: 24 mmol/L (ref 20–29)
Calcium: 9.3 mg/dL (ref 8.7–10.2)
Chloride: 105 mmol/L (ref 96–106)
Creatinine, Ser: 0.93 mg/dL (ref 0.57–1.00)
GFR calc Af Amer: 78 mL/min/{1.73_m2} (ref >59)
GFR calc non Af Amer: 68 mL/min/{1.73_m2} (ref >59)
Globulin, Total: 2.5 g/dL (ref 1.5–4.5)
Glucose: 83 mg/dL (ref 65–99)
Potassium: 4.4 mmol/L (ref 3.5–5.2)
Sodium: 142 mmol/L (ref 134–144)
Total Protein: 6.6 g/dL (ref 6.0–8.5)

## 2018-12-10 LAB — LIPID PANEL
Chol/HDL Ratio: 3.4 ratio (ref 0.0–4.4)
Cholesterol, Total: 179 mg/dL (ref 100–199)
HDL: 52 mg/dL (ref >39)
LDL Chol Calc (NIH): 103 mg/dL — ABNORMAL HIGH (ref 0–99)
Triglycerides: 136 mg/dL (ref 0–149)
VLDL Cholesterol Cal: 24 mg/dL (ref 5–40)

## 2018-12-10 LAB — TSH: TSH: 1.99 u[IU]/mL (ref 0.450–4.500)

## 2019-02-02 ENCOUNTER — Encounter: Payer: Self-pay | Admitting: Family Medicine

## 2019-02-02 ENCOUNTER — Other Ambulatory Visit: Payer: Self-pay

## 2019-02-02 ENCOUNTER — Ambulatory Visit (INDEPENDENT_AMBULATORY_CARE_PROVIDER_SITE_OTHER): Payer: BC Managed Care – PPO

## 2019-02-02 ENCOUNTER — Ambulatory Visit: Payer: BC Managed Care – PPO | Admitting: Family Medicine

## 2019-02-02 VITALS — BP 138/66 | HR 86 | Temp 97.3°F | Ht 61.0 in | Wt 237.6 lb

## 2019-02-02 DIAGNOSIS — S86911A Strain of unspecified muscle(s) and tendon(s) at lower leg level, right leg, initial encounter: Secondary | ICD-10-CM | POA: Diagnosis not present

## 2019-02-02 DIAGNOSIS — M25561 Pain in right knee: Secondary | ICD-10-CM | POA: Diagnosis not present

## 2019-02-02 DIAGNOSIS — W19XXXA Unspecified fall, initial encounter: Secondary | ICD-10-CM

## 2019-02-02 NOTE — Progress Notes (Signed)
Chief Complaint  Patient presents with  . Knee Pain    right after a fall this weekend     HPI  Patient presents today for pain in the right knee after falling 3 days ago (01/30/2019)  at her son's wedding. The pain is medial anterior joint area worse with ambulation. She fel forward onto the knee.   PMH: Smoking status noted ROS: Per HPI  Objective: BP 138/66   Pulse 86   Temp (!) 97.3 F (36.3 C) (Temporal)   Ht 5\' 1"  (1.549 m)   Wt 237 lb 9.6 oz (107.8 kg)   SpO2 97%   BMI 44.89 kg/m  Gen: NAD, alert, cooperative with exam HEENT: NCAT, EOMI, PERRL CV: RRR, good S1/S2, no murmur Resp: CTABL, no wheezes, non-labored Abd: SNTND, BS present, no guarding or organomegaly Ext: No edema, warm. Tender at the right medial aspect of patella. Pain, but no opening of valgus/varus stress. Lachman intact Neuro: Alert and oriented, No gross deficits. RLE NV intact.  Knee XR - no fx noted. 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   Assessment and plan:  1. Fall, initial encounter   2. Knee strain, right, initial encounter     No orders of the defined types were placed in this encounter.   Orders Placed This Encounter  Procedures  . DG Knee Complete 4 Views Right    Standing Status:   Future    Number of Occurrences:   1    Standing Expiration Date:   04/03/2020    Order Specific Question:   Reason for Exam (SYMPTOM  OR DIAGNOSIS REQUIRED)    Answer:   fall    Order Specific Question:   Is patient pregnant?    Answer:   No    Order Specific Question:   Preferred imaging location?    Answer:   Internal    Order Specific Question:   Radiology Contrast Protocol - do NOT remove file path    Answer:   [email or web address]    Follow up as needed.  Claretta Fraise, MD

## 2019-02-27 ENCOUNTER — Encounter: Payer: Self-pay | Admitting: Family Medicine

## 2019-02-27 ENCOUNTER — Ambulatory Visit (INDEPENDENT_AMBULATORY_CARE_PROVIDER_SITE_OTHER): Payer: BC Managed Care – PPO | Admitting: Family Medicine

## 2019-02-27 DIAGNOSIS — J0100 Acute maxillary sinusitis, unspecified: Secondary | ICD-10-CM

## 2019-02-27 MED ORDER — PREDNISONE 10 MG PO TABS: ORAL_TABLET | ORAL | 0 refills | Status: AC

## 2019-02-27 MED ORDER — AMOXICILLIN-POT CLAVULANATE 875-125 MG PO TABS: 1.0000 | ORAL_TABLET | Freq: Two times a day (BID) | ORAL | 0 refills | Status: AC

## 2019-02-27 NOTE — Progress Notes (Signed)
Subjective:    Patient ID: Deborah Bond, female    DOB: [birth year], 59 y.o.   MRN: [id number]   HPI: Deborah Bond is a 58 y.o. female presenting for 1 week of HA - pounding with posterior drainage. Blowing clear thick. Scratchy throat.  Now vomiting so hard her ribs hurt. HA- severe in forehead. 8-10/10. Mucinex helps. Feels cold.   .  Depression screen [facility] 2/9 02/02/2019 12/09/2018 08/20/2018 05/19/2018 02/17/2018  Decreased Interest 0 0 0 0 0  Down, Depressed, Hopeless 0 0 0 0 0  PHQ - 2 Score 0 0 0 0 0     Relevant past medical, surgical, family and social history reviewed and updated as indicated.  Interim medical history since our last visit reviewed. Allergies and medications reviewed and updated.  ROS:  Review of Systems  Constitutional: Negative for activity change, appetite change, chills and fever.  HENT: Positive for congestion, postnasal drip, rhinorrhea and sinus pressure. Negative for ear discharge, ear pain, hearing loss, nosebleeds, sneezing and trouble swallowing.   Respiratory: Negative for chest tightness and shortness of breath.   Cardiovascular: Negative for chest pain and palpitations.  Skin: Negative for rash.     Social History   Tobacco Use  Smoking Status Former Smoker  Smokeless Tobacco Never Used       Objective:     Wt Readings from Last 3 Encounters:  02/02/19 237 lb 9.6 oz (107.8 kg)  12/09/18 230 lb 12.8 oz (104.7 kg)  08/20/18 228 lb 9.6 oz (103.7 kg)     Exam deferred. Pt. Harboring due to COVID 19. Phone visit performed.   Assessment & Plan:   1. Acute maxillary sinusitis, recurrence not specified     Meds ordered this encounter  Medications  . amoxicillin-clavulanate (AUGMENTIN) 875-125 MG tablet    Sig: Take 1 tablet by mouth 2 (two) times daily. Take all of this medication    Dispense:  20 tablet    Refill:  0  . predniSONE (DELTASONE) 10 MG tablet    Sig: Take 5 daily for 2 days followed by 4,3,2 and 1 for  2 days each.    Dispense:  30 tablet    Refill:  0    No orders of the defined types were placed in this encounter.     Diagnoses and all orders for this visit:  Acute maxillary sinusitis, recurrence not specified  Other orders -     amoxicillin-clavulanate (AUGMENTIN) 875-125 MG tablet; Take 1 tablet by mouth 2 (two) times daily. Take all of this medication -     predniSONE (DELTASONE) 10 MG tablet; Take 5 daily for 2 days followed by 4,3,2 and 1 for 2 days each.    Virtual Visit via telephone Note  I discussed the limitations, risks, security and privacy concerns of performing an evaluation and management service by telephone and the availability of in person appointments. The patient was identified with two identifiers. Pt.expressed understanding and agreed to proceed. Pt. Is at home. Dr. Darlyn Read is in his office.  Follow Up Instructions:   I discussed the assessment and treatment plan with the patient. The patient was provided an opportunity to ask questions and all were answered. The patient agreed with the plan and demonstrated an understanding of the instructions.   The patient was advised to call back or seek an in-person evaluation if the symptoms worsen or if the condition fails to improve as anticipated.   Total minutes including chart review  and phone contact time: 11   Follow up plan: Return if symptoms worsen or fail to improve.  Claretta Fraise, MD [place]

## 2019-03-03 ENCOUNTER — Other Ambulatory Visit: Payer: Self-pay

## 2019-03-03 ENCOUNTER — Inpatient Hospital Stay (HOSPITAL_COMMUNITY)
Admission: EM | Admit: 2019-03-03 | Discharge: 2019-03-27 | DRG: 981 | Disposition: E | Payer: BC Managed Care – PPO | Attending: Pulmonary Disease | Admitting: Pulmonary Disease

## 2019-03-03 ENCOUNTER — Emergency Department (HOSPITAL_COMMUNITY): Payer: BC Managed Care – PPO

## 2019-03-03 ENCOUNTER — Encounter (HOSPITAL_COMMUNITY): Payer: Self-pay | Admitting: Emergency Medicine

## 2019-03-03 DIAGNOSIS — E785 Hyperlipidemia, unspecified: Secondary | ICD-10-CM | POA: Diagnosis present

## 2019-03-03 DIAGNOSIS — U071 COVID-19: Principal | ICD-10-CM | POA: Diagnosis present

## 2019-03-03 DIAGNOSIS — Z9911 Dependence on respirator [ventilator] status: Secondary | ICD-10-CM

## 2019-03-03 DIAGNOSIS — I614 Nontraumatic intracerebral hemorrhage in cerebellum: Secondary | ICD-10-CM | POA: Diagnosis not present

## 2019-03-03 DIAGNOSIS — Z9289 Personal history of other medical treatment: Secondary | ICD-10-CM

## 2019-03-03 DIAGNOSIS — F321 Major depressive disorder, single episode, moderate: Secondary | ICD-10-CM | POA: Diagnosis present

## 2019-03-03 DIAGNOSIS — Y95 Nosocomial condition: Secondary | ICD-10-CM | POA: Diagnosis not present

## 2019-03-03 DIAGNOSIS — R112 Nausea with vomiting, unspecified: Secondary | ICD-10-CM | POA: Diagnosis present

## 2019-03-03 DIAGNOSIS — M5136 Other intervertebral disc degeneration, lumbar region: Secondary | ICD-10-CM | POA: Diagnosis present

## 2019-03-03 DIAGNOSIS — I959 Hypotension, unspecified: Secondary | ICD-10-CM | POA: Diagnosis not present

## 2019-03-03 DIAGNOSIS — Z6841 Body Mass Index (BMI) 40.0 and over, adult: Secondary | ICD-10-CM

## 2019-03-03 DIAGNOSIS — G919 Hydrocephalus, unspecified: Secondary | ICD-10-CM | POA: Diagnosis not present

## 2019-03-03 DIAGNOSIS — Z7982 Long term (current) use of aspirin: Secondary | ICD-10-CM | POA: Diagnosis not present

## 2019-03-03 DIAGNOSIS — G911 Obstructive hydrocephalus: Secondary | ICD-10-CM | POA: Diagnosis not present

## 2019-03-03 DIAGNOSIS — J9601 Acute respiratory failure with hypoxia: Secondary | ICD-10-CM | POA: Diagnosis not present

## 2019-03-03 DIAGNOSIS — I619 Nontraumatic intracerebral hemorrhage, unspecified: Secondary | ICD-10-CM

## 2019-03-03 DIAGNOSIS — J069 Acute upper respiratory infection, unspecified: Secondary | ICD-10-CM

## 2019-03-03 DIAGNOSIS — E874 Mixed disorder of acid-base balance: Secondary | ICD-10-CM | POA: Diagnosis not present

## 2019-03-03 DIAGNOSIS — D649 Anemia, unspecified: Secondary | ICD-10-CM | POA: Diagnosis not present

## 2019-03-03 DIAGNOSIS — A419 Sepsis, unspecified organism: Secondary | ICD-10-CM | POA: Diagnosis not present

## 2019-03-03 DIAGNOSIS — J1289 Other viral pneumonia: Secondary | ICD-10-CM | POA: Diagnosis present

## 2019-03-03 DIAGNOSIS — R0602 Shortness of breath: Secondary | ICD-10-CM | POA: Diagnosis not present

## 2019-03-03 DIAGNOSIS — J80 Acute respiratory distress syndrome: Secondary | ICD-10-CM | POA: Diagnosis not present

## 2019-03-03 DIAGNOSIS — E876 Hypokalemia: Secondary | ICD-10-CM | POA: Diagnosis present

## 2019-03-03 DIAGNOSIS — I4891 Unspecified atrial fibrillation: Secondary | ICD-10-CM | POA: Diagnosis not present

## 2019-03-03 DIAGNOSIS — N179 Acute kidney failure, unspecified: Secondary | ICD-10-CM | POA: Diagnosis not present

## 2019-03-03 DIAGNOSIS — R34 Anuria and oliguria: Secondary | ICD-10-CM | POA: Diagnosis not present

## 2019-03-03 DIAGNOSIS — Z79899 Other long term (current) drug therapy: Secondary | ICD-10-CM

## 2019-03-03 DIAGNOSIS — L8990 Pressure ulcer of unspecified site, unspecified stage: Secondary | ICD-10-CM | POA: Diagnosis not present

## 2019-03-03 DIAGNOSIS — E870 Hyperosmolality and hypernatremia: Secondary | ICD-10-CM | POA: Diagnosis not present

## 2019-03-03 DIAGNOSIS — E871 Hypo-osmolality and hyponatremia: Secondary | ICD-10-CM | POA: Diagnosis present

## 2019-03-03 DIAGNOSIS — J15211 Pneumonia due to Methicillin susceptible Staphylococcus aureus: Secondary | ICD-10-CM | POA: Diagnosis not present

## 2019-03-03 DIAGNOSIS — Z79890 Hormone replacement therapy (postmenopausal): Secondary | ICD-10-CM

## 2019-03-03 DIAGNOSIS — E7800 Pure hypercholesterolemia, unspecified: Secondary | ICD-10-CM | POA: Diagnosis present

## 2019-03-03 DIAGNOSIS — Z87891 Personal history of nicotine dependence: Secondary | ICD-10-CM

## 2019-03-03 DIAGNOSIS — J1282 Pneumonia due to coronavirus disease 2019: Secondary | ICD-10-CM | POA: Diagnosis present

## 2019-03-03 DIAGNOSIS — E875 Hyperkalemia: Secondary | ICD-10-CM | POA: Diagnosis not present

## 2019-03-03 DIAGNOSIS — Z452 Encounter for adjustment and management of vascular access device: Secondary | ICD-10-CM

## 2019-03-03 DIAGNOSIS — I618 Other nontraumatic intracerebral hemorrhage: Secondary | ICD-10-CM | POA: Diagnosis not present

## 2019-03-03 DIAGNOSIS — J189 Pneumonia, unspecified organism: Secondary | ICD-10-CM | POA: Diagnosis not present

## 2019-03-03 DIAGNOSIS — E162 Hypoglycemia, unspecified: Secondary | ICD-10-CM | POA: Diagnosis present

## 2019-03-03 DIAGNOSIS — A4101 Sepsis due to Methicillin susceptible Staphylococcus aureus: Secondary | ICD-10-CM | POA: Diagnosis not present

## 2019-03-03 DIAGNOSIS — R6521 Severe sepsis with septic shock: Secondary | ICD-10-CM | POA: Diagnosis not present

## 2019-03-03 DIAGNOSIS — R739 Hyperglycemia, unspecified: Secondary | ICD-10-CM | POA: Diagnosis not present

## 2019-03-03 DIAGNOSIS — G2581 Restless legs syndrome: Secondary | ICD-10-CM | POA: Diagnosis present

## 2019-03-03 DIAGNOSIS — T45515A Adverse effect of anticoagulants, initial encounter: Secondary | ICD-10-CM | POA: Diagnosis not present

## 2019-03-03 DIAGNOSIS — J1520 Pneumonia due to staphylococcus, unspecified: Secondary | ICD-10-CM | POA: Diagnosis not present

## 2019-03-03 DIAGNOSIS — G9340 Encephalopathy, unspecified: Secondary | ICD-10-CM | POA: Diagnosis not present

## 2019-03-03 DIAGNOSIS — J9690 Respiratory failure, unspecified, unspecified whether with hypoxia or hypercapnia: Secondary | ICD-10-CM

## 2019-03-03 DIAGNOSIS — J9600 Acute respiratory failure, unspecified whether with hypoxia or hypercapnia: Secondary | ICD-10-CM

## 2019-03-03 DIAGNOSIS — E039 Hypothyroidism, unspecified: Secondary | ICD-10-CM | POA: Diagnosis present

## 2019-03-03 DIAGNOSIS — I10 Essential (primary) hypertension: Secondary | ICD-10-CM | POA: Diagnosis present

## 2019-03-03 LAB — COMPREHENSIVE METABOLIC PANEL
ALT: 18 U/L (ref 0–44)
AST: 35 U/L (ref 15–41)
Albumin: 2.8 g/dL — ABNORMAL LOW (ref 3.5–5.0)
Alkaline Phosphatase: 69 U/L (ref 38–126)
Anion gap: 12 (ref 5–15)
BUN: 26 mg/dL — ABNORMAL HIGH (ref 6–20)
CO2: 20 mmol/L — ABNORMAL LOW (ref 22–32)
Calcium: 7.9 mg/dL — ABNORMAL LOW (ref 8.9–10.3)
Chloride: 102 mmol/L (ref 98–111)
Creatinine, Ser: 1.14 mg/dL — ABNORMAL HIGH (ref 0.44–1.00)
GFR calc Af Amer: >60 mL/min (ref >60)
GFR calc non Af Amer: 53 mL/min — ABNORMAL LOW (ref >60)
Glucose, Bld: 141 mg/dL — ABNORMAL HIGH (ref 70–99)
Potassium: 3.4 mmol/L — ABNORMAL LOW (ref 3.5–5.1)
Sodium: 134 mmol/L — ABNORMAL LOW (ref 135–145)
Total Bilirubin: 0.4 mg/dL (ref 0.3–1.2)
Total Protein: 6.6 g/dL (ref 6.5–8.1)

## 2019-03-03 LAB — CBC WITH DIFFERENTIAL/PLATELET
Abs Immature Granulocytes: 0.05 10*3/uL (ref 0.00–0.07)
Basophils Absolute: 0 10*3/uL (ref 0.0–0.1)
Basophils Relative: 0 %
Eosinophils Absolute: 0 10*3/uL (ref 0.0–0.5)
Eosinophils Relative: 0 %
HCT: 41.7 % (ref 36.0–46.0)
Hemoglobin: 13.2 g/dL (ref 12.0–15.0)
Immature Granulocytes: 1 %
Lymphocytes Relative: 22 %
Lymphs Abs: 1.5 10*3/uL (ref 0.7–4.0)
MCH: 29.6 pg (ref 26.0–34.0)
MCHC: 31.7 g/dL (ref 30.0–36.0)
MCV: 93.5 fL (ref 80.0–100.0)
Monocytes Absolute: 0.6 10*3/uL (ref 0.1–1.0)
Monocytes Relative: 9 %
Neutro Abs: 4.7 10*3/uL (ref 1.7–7.7)
Neutrophils Relative %: 68 %
Platelets: 247 10*3/uL (ref 150–400)
RBC: 4.46 MIL/uL (ref 3.87–5.11)
RDW: 13.5 % (ref 11.5–15.5)
WBC: 6.8 10*3/uL (ref 4.0–10.5)
nRBC: 0 % (ref 0.0–0.2)

## 2019-03-03 LAB — I-STAT BETA HCG BLOOD, ED (MC, WL, AP ONLY): I-stat hCG, quantitative: <5 m[IU]/mL (ref <5)

## 2019-03-03 LAB — LACTATE DEHYDROGENASE: LDH: 484 U/L — ABNORMAL HIGH (ref 98–192)

## 2019-03-03 LAB — LACTIC ACID, PLASMA: Lactic Acid, Venous: 1.4 mmol/L (ref 0.5–1.9)

## 2019-03-03 LAB — C-REACTIVE PROTEIN: CRP: 15.6 mg/dL — ABNORMAL HIGH (ref <1.0)

## 2019-03-03 LAB — D-DIMER, QUANTITATIVE: D-Dimer, Quant: 1.44 ug/mL-FEU — ABNORMAL HIGH (ref 0.00–0.50)

## 2019-03-03 LAB — TRIGLYCERIDES: Triglycerides: 160 mg/dL — ABNORMAL HIGH (ref <150)

## 2019-03-03 LAB — PROCALCITONIN: Procalcitonin: 0.14 ng/mL

## 2019-03-03 LAB — FERRITIN: Ferritin: 298 ng/mL (ref 11–307)

## 2019-03-03 LAB — FIBRINOGEN: Fibrinogen: 551 mg/dL — ABNORMAL HIGH (ref 210–475)

## 2019-03-03 MED ORDER — ALBUTEROL SULFATE HFA 108 (90 BASE) MCG/ACT IN AERS
6.0000 | INHALATION_SPRAY | RESPIRATORY_TRACT | Status: DC | PRN
  Administered 2019-03-03 – 2019-03-04 (×2): 6 via RESPIRATORY_TRACT
  Filled 2019-03-03: qty 6.7

## 2019-03-03 MED ORDER — SODIUM CHLORIDE 0.9 % IV BOLUS
1000.0000 mL | Freq: Once | INTRAVENOUS | Status: AC
  Administered 2019-03-04: 1000 mL via INTRAVENOUS

## 2019-03-03 MED ORDER — ONDANSETRON HCL 4 MG/2ML IJ SOLN
4.0000 mg | Freq: Once | INTRAMUSCULAR | Status: AC
  Administered 2019-03-04: 4 mg via INTRAVENOUS
  Filled 2019-03-03: qty 2

## 2019-03-03 MED ORDER — SODIUM CHLORIDE 0.9 % IV SOLN
100.0000 mg | Freq: Every day | INTRAVENOUS | Status: AC
  Administered 2019-03-05 – 2019-03-08 (×4): 100 mg via INTRAVENOUS
  Filled 2019-03-03 (×4): qty 20

## 2019-03-03 MED ORDER — ACETAMINOPHEN 325 MG PO TABS
650.0000 mg | ORAL_TABLET | Freq: Once | ORAL | Status: AC
  Administered 2019-03-03: 650 mg via ORAL
  Filled 2019-03-03: qty 2

## 2019-03-03 MED ORDER — SODIUM CHLORIDE 0.9 % IV SOLN
200.0000 mg | Freq: Once | INTRAVENOUS | Status: AC
  Administered 2019-03-04: 200 mg via INTRAVENOUS
  Filled 2019-03-03: qty 40

## 2019-03-03 NOTE — ED Notes (Signed)
Husband came into ED asking for update. RN made aware.

## 2019-03-03 NOTE — ED Notes (Signed)
Husband waiting in ED parking lot. Update as soon as possible.

## 2019-03-03 NOTE — ED Provider Notes (Signed)
[facility] EMERGENCY DEPARTMENT Provider Note   CSN: [id number] Arrival date & time: 03/10/2019  2043     History   Chief Complaint Chief Complaint  Patient presents with  . Emesis / SOB / Fatigue    HPI Deborah Bond is a 58 y.o. female.     The history is provided by the patient and medical records. No language interpreter was used.     58 year old female with history of hyperlipidemia, depression, thyroid disease, recently test positive COVID-19 yesterday presents the ED with complaints of generalized fatigue.  Patient report for the past week she has had poor appetite, feeling nauseous, vomiting multiple episodes of nonbloody nonbilious emesis, and now some more shortness of breath.  She does not complain of any loss of taste or smell.  She denies any significant cough.  She denies any direct sick contact with anyone with COVID-19.  She does not complain of any chest pain or abdominal pain or rash.  No dysuria.  She report tested positive for COVID-19 recently.  She denies tobacco use or alcohol use.  She does not wear home oxygen.  No history of asthma or COPD.  Past Medical History:  Diagnosis Date  . Depression   . Hyperlipidemia   . Thyroid disease     Patient Active Problem List   Diagnosis Date Noted  . Depression, major, single episode, moderate (HCC) 08/26/2018  . Restless legs 08/20/2018  . Radicular syndrome of right leg 10/23/2017  . De Quervain's disease (tenosynovitis) 04/25/2017  . DDD (degenerative disc disease), lumbar 12/05/2016  . Acquired hypothyroidism 12/05/2016  . Pure hypercholesterolemia 12/05/2016  . History of staphylococcal infection 12/05/2016    Past Surgical History:  Procedure Laterality Date  . ABDOMINAL HYSTERECTOMY    . BACK SURGERY    . THYROID SURGERY       OB History   No obstetric history on file.      Home Medications    Prior to Admission medications   Medication Sig Start Date End Date  Taking? Authorizing Provider  amoxicillin-clavulanate (AUGMENTIN) 875-125 MG tablet Take 1 tablet by mouth 2 (two) times daily. Take all of this medication 02/27/19   Mechele Claude, MD  aspirin EC 81 MG tablet Take 81 mg by mouth daily.    [provider]  atorvastatin (LIPITOR) 20 MG tablet TAKE ONE (1) TABLET EACH DAY 12/09/18   Remus Loffler, PA-C  Calcium Carbonate-Vitamin D (CALCIUM 600+D) 600-200 MG-UNIT TABS Take by mouth.    [provider]  diclofenac sodium (VOLTAREN) 1 % GEL Apply 2 g topically 4 (four) times daily. 12/09/18   Remus Loffler, PA-C  escitalopram (LEXAPRO) 20 MG tablet Take 1 tablet (20 mg total) by mouth daily. 08/20/18   Remus Loffler, PA-C  ibuprofen (ADVIL) 800 MG tablet TAKE ONE TABLET 3 TIMES A DAY AS NEEDED. 08/20/18   Remus Loffler, PA-C  levothyroxine (SYNTHROID) 100 MCG tablet TAKE ONE (1) TABLET EACH DAY 12/09/18   Remus Loffler, PA-C  predniSONE (DELTASONE) 10 MG tablet Take 5 daily for 2 days followed by 4,3,2 and 1 for 2 days each. 02/27/19   Mechele Claude, MD  UNABLE TO FIND Med Name: Amanda Cockayne Complete    [provider]    Family History Family History  Problem Relation Age of Onset  . Cancer Mother        lymphoma and lung  . Cancer Father  stomach/pancreatic     Social History Social History   Tobacco Use  . Smoking status: Former Research scientist (life sciences)  . Smokeless tobacco: Never Used  Substance Use Topics  . Alcohol use: No  . Drug use: No     Allergies   Patient has no known allergies.   Review of Systems Review of Systems  All other systems reviewed and are negative.    Physical Exam Updated Vital Signs BP 136/64 (BP Location: Right Arm)   Pulse (!) 102   Temp 100.2 F (37.9 C) (Oral)   Resp (!) 22   SpO2 (!) 87%   Physical Exam Vitals signs and nursing note reviewed.  Constitutional:      General: She is not in acute distress.    Appearance: She is well-developed. She is obese.  HENT:     Head:  Atraumatic.  Eyes:     Conjunctiva/sclera: Conjunctivae normal.  Neck:     Musculoskeletal: Neck supple.  Cardiovascular:     Rate and Rhythm: Tachycardia present.  Pulmonary:     Breath sounds: Rales present. No wheezing or rhonchi.  Abdominal:     Palpations: Abdomen is soft.     Tenderness: There is no abdominal tenderness.  Skin:    Findings: No rash.  Neurological:     Mental Status: She is alert and oriented to person, place, and time.  Psychiatric:        Mood and Affect: Mood normal.      ED Treatments / Results  Labs (all labs ordered are listed, but only abnormal results are displayed) Labs Reviewed  COMPREHENSIVE METABOLIC PANEL - Abnormal; Notable for the following components:      Result Value   Sodium 134 (*)    Potassium 3.4 (*)    CO2 20 (*)    Glucose, Bld 141 (*)    BUN 26 (*)    Creatinine, Ser 1.14 (*)    Calcium 7.9 (*)    Albumin 2.8 (*)    GFR calc non Af Amer 53 (*)    All other components within normal limits  D-DIMER, QUANTITATIVE (NOT AT [facility]) - Abnormal; Notable for the following components:   D-Dimer, Quant 1.44 (*)    All other components within normal limits  LACTATE DEHYDROGENASE - Abnormal; Notable for the following components:   LDH 484 (*)    All other components within normal limits  TRIGLYCERIDES - Abnormal; Notable for the following components:   Triglycerides 160 (*)    All other components within normal limits  FIBRINOGEN - Abnormal; Notable for the following components:   Fibrinogen 551 (*)    All other components within normal limits  C-REACTIVE PROTEIN - Abnormal; Notable for the following components:   CRP 15.6 (*)    All other components within normal limits  CULTURE, BLOOD (ROUTINE X 2)  CULTURE, BLOOD (ROUTINE X 2)  CBC WITH DIFFERENTIAL/PLATELET  LACTIC ACID, PLASMA  PROCALCITONIN  FERRITIN  LACTIC ACID, PLASMA  I-STAT BETA HCG BLOOD, ED ([facility], [facility], [facility] ONLY)  POC SARS CORONAVIRUS 2 AG -  ED    EKG EKG  Interpretation  Date/Time:  03-25-19 22:11:45 EST Ventricular Rate:  85 PR Interval:    QRS Duration: 92 QT Interval:  361 QTC Calculation: 430 R Axis:   -5 Text Interpretation: Sinus rhythm unremarkable ecg Confirmed by Carmin Muskrat [phone]) on March 25, 2019 10:18:52 PM   Radiology Dg Chest Portable 1 View  Result Date: 03-25-19 CLINICAL DATA:  COVID (+),  shortness of breath. Additional provided: Multiple episodes of emesis with fatigue, shortness of breath and poor appetite. EXAM: PORTABLE CHEST 1 VIEW COMPARISON:  Report from chest radiograph 11/01/1998 (images unavailable). FINDINGS: Shallow inspiration radiograph which extension weights the cardiac silhouette and limits evaluation of heart size. Aortic atherosclerosis Prominent bilateral airspace opacities within mid to lower lung predominance, likely reflecting pneumonia given provided history. There is also nonspecific diffuse bilateral interstitial prominence, which may reflect the same process. No sizable pleural effusion or pneumothorax. No acute bony abnormality. Spinal stimulator leads project over the lower thoracic spine. IMPRESSION: Prominent bilateral airspace opacities with a mid to lower lung predominance, likely reflecting pneumonia given provided history. Electronically Signed   By: Jackey Loge DO   On: 03/24/2019 21:53    Procedures .Critical Care Performed by: Fayrene Helper, PA-C Authorized by: Fayrene Helper, PA-C   Critical care provider statement:    Critical care time (minutes):  45   Critical care was time spent personally by me on the following activities:  Discussions with consultants, evaluation of patient's response to treatment, examination of patient, ordering and performing treatments and interventions, ordering and review of laboratory studies, ordering and review of radiographic studies, pulse oximetry, re-evaluation of patient's condition, obtaining history from patient or surrogate and review  of old charts   (including critical care time)  Medications Ordered in ED Medications  acetaminophen (TYLENOL) tablet 650 mg (has no administration in time range)  albuterol (VENTOLIN HFA) 108 (90 Base) MCG/ACT inhaler 6 puff (has no administration in time range)  ondansetron (ZOFRAN) injection 4 mg (has no administration in time range)  sodium chloride 0.9 % bolus 1,000 mL (has no administration in time range)     Initial Impression / Assessment and Plan / ED Course  I have reviewed the triage vital signs and the nursing notes.  Pertinent labs & imaging results that were available during my care of the patient were reviewed by me and considered in my medical decision making (see chart for details).        BP 136/64 (BP Location: Right Arm)   Pulse (!) 102   Temp 100.2 F (37.9 C) (Oral)   Resp (!) 22   SpO2 (!) 87%    Final Clinical Impressions(s) / ED Diagnoses   Final diagnoses:  Acute hypoxemic respiratory failure due to COVID-19 [facility])    ED Discharge Orders    None     9:47 PM Patient recently diagnosed with COVID-19 presenting complaining of persistent nausea vomiting and generalized body aches.  She does have a low-grade temperature of 100.2, mildly tachycardic with a heart rate of 102, mildly tachypneic with a respiratory of 22 as well as hypoxia with an O2 of 87%'s on room air.  O2 sat improved to 91% at 3 L.  Chest x-ray shows bilateral diffuse infiltrate consistent with viral pneumonia.  In the setting of hypoxia and positive COVID-19, patient will benefit from hospitalization for further management self acute respiratory distress secondary to COVID-19.  We will provide IV fluid, antinausea medication, and albuterol inhaler.  Deborah Bond was evaluated in Emergency Department on 03/10/2019 for the symptoms described in the history of present illness. She was evaluated in the context of the global COVID-19 pandemic, which necessitated consideration that the  patient might be at risk for infection with the SARS-CoV-2 virus that causes COVID-19. Institutional protocols and algorithms that pertain to the evaluation of patients at risk for COVID-19 are in a state of rapid change  based on information released by regulatory bodies including the CDC and federal and state organizations. These policies and algorithms were followed during the patient's care in the ED.  11:23 PM Appreciate consultation from [facility] Dr. Mikeal HawthorneGarba who agrees to see and admit pt.    Fayrene Helperran, Verland Sprinkle, PA-C 2018/05/25 [id number]    Gerhard MunchLockwood, Robert, MD 03/05/19 [phone]

## 2019-03-03 NOTE — H&P (Signed)
History and Physical   Deborah Bond AYT:[id number] DOB: 03/29/1960 DOA: 02/26/2019  Referring MD/NP/PA: Dr Vanita Panda  PCP: Terald Sleeper, PA-C   Outpatient Specialists: None   Patient coming from: Home  Chief Complaint: SOB, Cough  HPI: Deborah Bond is a 58 y.o. female with medical history significant of hypertension, hypothyroidism, depression, tenosynovitis and restless leg syndrome who presented to the ER with shortness of breath cough and fever.  Patient was diagnosed with COVID-19 infection 3 days ago.  At that point she was mildly symptomatic.  She was placed on amoxicillin and prednisone.  Patient returned to the ER today due to worsening symptoms.  More cough more shortness of breath.  Also low-grade temperature.  In the ER patient was found to be hypoxic.  She is currently on 6 L of oxygen.  Due to patient being symptomatic and requiring oxygen now she is being admitted to the medical service.  Denied any diarrhea.  Denied any abdominal pain..  ED Course: Temperature is 100.2 blood pressure 125/51 pulse 102 respiratory rate of 28 oxygen sat 87% on room air.  Currently 94% on 6 L.  Generally patient is morbidly obese awake alert oriented no acute distress HEENT PERRLA no pallor jaundice or any neck is supple no JVD no lymphadenopathy respiratory decreased air entry bilaterally with coarse breath sound cardiovascular system S1-S2 no audible murmur abdomen is soft full nontender with positive bowel sounds extremities no edema cyanosis or clubbing skin exam no rashes or ulcers.  Labs white count 6.8 hemoglobin 13.2 with platelet count 247.  Sodium 134 potassium 3.4 chloride 102 CO2 20 BUN 26 creatinine 1.14 and calcium 9.9.  Patient initiated on treatment and will be admitted for COVID-19 pneumonia.  Chest x-ray showed bilateral infiltrates.  Review of Systems: As per HPI otherwise 10 point review of systems negative.    Past Medical History:  Diagnosis Date  . Depression   .  Hyperlipidemia   . Thyroid disease     Past Surgical History:  Procedure Laterality Date  . ABDOMINAL HYSTERECTOMY    . BACK SURGERY    . THYROID SURGERY       reports that she has quit smoking. She has never used smokeless tobacco. She reports that she does not drink alcohol or use drugs.  No Known Allergies  Family History  Problem Relation Age of Onset  . Cancer Mother        lymphoma and lung  . Cancer Father        stomach/pancreatic      Prior to Admission medications   Medication Sig Start Date End Date Taking? Authorizing Provider  amoxicillin-clavulanate (AUGMENTIN) 875-125 MG tablet Take 1 tablet by mouth 2 (two) times daily. Take all of this medication 02/27/19   Claretta Fraise, MD  aspirin EC 81 MG tablet Take 81 mg by mouth daily.    [provider]  atorvastatin (LIPITOR) 20 MG tablet TAKE ONE (1) TABLET EACH DAY 12/09/18   Terald Sleeper, PA-C  Calcium Carbonate-Vitamin D (CALCIUM 600+D) 600-200 MG-UNIT TABS Take by mouth.    [provider]  diclofenac sodium (VOLTAREN) 1 % GEL Apply 2 g topically 4 (four) times daily. 12/09/18   Terald Sleeper, PA-C  escitalopram (LEXAPRO) 20 MG tablet Take 1 tablet (20 mg total) by mouth daily. 08/20/18   Terald Sleeper, PA-C  ibuprofen (ADVIL) 800 MG tablet TAKE ONE TABLET 3 TIMES A DAY AS NEEDED. 08/20/18   Terald Sleeper,  PA-C  levothyroxine (SYNTHROID) 100 MCG tablet TAKE ONE (1) TABLET EACH DAY 12/09/18   Remus Loffler, PA-C  predniSONE (DELTASONE) 10 MG tablet Take 5 daily for 2 days followed by 4,3,2 and 1 for 2 days each. 02/27/19   Mechele Claude, MD  UNABLE TO FIND Med Name: [facility] Complete    [provider]    Physical Exam: Vitals:   2019/03/30 2053 03/30/19 2210  BP: 136/64 (!) 125/51  Pulse: (!) 102 89  Resp: (!) 22 (!) 28  Temp: 100.2 F (37.9 C)   TempSrc: Oral   SpO2: (!) 87% (!) 88%      Constitutional: NAD, obese no acute distress Vitals:   30-Mar-2019 2053 03/30/2019 2210  BP:  136/64 (!) 125/51  Pulse: (!) 102 89  Resp: (!) 22 (!) 28  Temp: 100.2 F (37.9 C)   TempSrc: Oral   SpO2: (!) 87% (!) 88%   Eyes: PERRL, lids and conjunctivae normal ENMT: Mucous membranes are dry. Posterior pharynx clear of any exudate or lesions.Normal dentition.  Neck: normal, supple, no masses, no thyromegaly Respiratory:  decreased air entry bilaterally with coarse breath sounds, rhonchi and some expiratory wheezing, increased work of breathing no accessory muscle use.  Cardiovascular: Regular rate and rhythm, no murmurs / rubs / gallops. No extremity edema. 2+ pedal pulses. No carotid bruits.  Abdomen: no tenderness, no masses palpated. No hepatosplenomegaly. Bowel sounds positive.  Musculoskeletal: no clubbing / cyanosis. No joint deformity upper and lower extremities. Good ROM, no contractures. Normal muscle tone.  Skin: no rashes, lesions, ulcers. No induration Neurologic: CN 2-12 grossly intact. Sensation intact, DTR normal. Strength 5/5 in all 4.  Psychiatric: Normal judgment and insight. Alert and oriented x 3. Normal mood.     Labs on Admission: I have personally reviewed following labs and imaging studies  CBC: Recent Labs  Lab March 30, 2019 2117  WBC 6.8  NEUTROABS 4.7  HGB 13.2  HCT 41.7  MCV 93.5  PLT 247   Basic Metabolic Panel: Recent Labs  Lab 03/30/19 2117  NA 134*  K 3.4*  CL 102  CO2 20*  GLUCOSE 141*  BUN 26*  CREATININE 1.14*  CALCIUM 7.9*   GFR: CrCl cannot be calculated (Unknown ideal weight.). Liver Function Tests: Recent Labs  Lab 03-30-2019 2117  AST 35  ALT 18  ALKPHOS 69  BILITOT 0.4  PROT 6.6  ALBUMIN 2.8*   No results for input(s): LIPASE, AMYLASE in the last 168 hours. No results for input(s): AMMONIA in the last 168 hours. Coagulation Profile: No results for input(s): INR, PROTIME in the last 168 hours. Cardiac Enzymes: No results for input(s): CKTOTAL, CKMB, CKMBINDEX, TROPONINI in the last 168 hours. BNP (last 3  results) No results for input(s): PROBNP in the last 8760 hours. HbA1C: No results for input(s): HGBA1C in the last 72 hours. CBG: No results for input(s): GLUCAP in the last 168 hours. Lipid Profile: Recent Labs    03/30/2019 2132  TRIG 160*   Thyroid Function Tests: No results for input(s): TSH, T4TOTAL, FREET4, T3FREE, THYROIDAB in the last 72 hours. Anemia Panel: Recent Labs    03-30-2019 2117  FERRITIN 298   Urine analysis:    Component Value Date/Time   APPEARANCEUR Clear 12/05/2016 1222   GLUCOSEU Negative 12/05/2016 1222   BILIRUBINUR Negative 12/05/2016 1222   PROTEINUR Negative 12/05/2016 1222   NITRITE Negative 12/05/2016 1222   LEUKOCYTESUR Negative 12/05/2016 1222   Sepsis Labs: @LABRCNTIP (procalcitonin:4,lacticidven:4) )No results found for this  or any previous visit (from the past 240 hour(s)).   Radiological Exams on Admission: Dg Chest Portable 1 View  Result Date: 03/04/2019 CLINICAL DATA:  COVID (+), shortness of breath. Additional provided: Multiple episodes of emesis with fatigue, shortness of breath and poor appetite. EXAM: PORTABLE CHEST 1 VIEW COMPARISON:  Report from chest radiograph 11/01/1998 (images unavailable). FINDINGS: Shallow inspiration radiograph which extension weights the cardiac silhouette and limits evaluation of heart size. Aortic atherosclerosis Prominent bilateral airspace opacities within mid to lower lung predominance, likely reflecting pneumonia given provided history. There is also nonspecific diffuse bilateral interstitial prominence, which may reflect the same process. No sizable pleural effusion or pneumothorax. No acute bony abnormality. Spinal stimulator leads project over the lower thoracic spine. IMPRESSION: Prominent bilateral airspace opacities with a mid to lower lung predominance, likely reflecting pneumonia given provided history. Electronically Signed   By: Jackey LogeKyle  Golden DO   On: 08/26/2018 21:53     Assessment/Plan  Principal Problem:   Acute respiratory failure with hypoxia (HCC) Active Problems:   DDD (degenerative disc disease), lumbar   Acquired hypothyroidism   Pure hypercholesterolemia   Depression, major, single episode, moderate (HCC)   Pneumonia due to COVID-19 virus   Hypokalemia   Hyponatremia     #1 acute respiratory failure: Hypoxia secondary to COVID-19 pneumonia.  Continue to titrate oxygen.  Continue specific treatment for COVID-19 and monitor.  #2 COVID-19 pneumonia: [facility]  Initiate remdesivir, dexamethasone, Lovenox, oxygen and inhalers.  Will await C-reactive protein to see if we may need to start Actemra.  #3 hypothyroidism: Continue with levothyroxine.  #4 hypokalemia: We will replete potassium.  #5 hyponatremia: Hydrate with saline.  Monitor sodium level.  #6 hyperlipidemia: Continue home regimen.   DVT prophylaxis: Lovenox Code Status: Full code Family Communication: No family at bedside Disposition Plan: Home Consults called: None Admission status: Inpatient  Severity of Illness: The appropriate patient status for this patient is INPATIENT. Inpatient status is judged to be reasonable and necessary in order to provide the required intensity of service to ensure the patient's safety. The patient's presenting symptoms, physical exam findings, and initial radiographic and laboratory data in the context of their chronic comorbidities is felt to place them at high risk for further clinical deterioration. Furthermore, it is not anticipated that the patient will be medically stable for discharge from the hospital within 2 midnights of admission. The following factors support the patient status of inpatient.   " The patient's presenting symptoms include shortness of breath and cough. " The worrisome physical exam findings include coarse breath sound bilaterally. " The initial radiographic and laboratory data are worrisome because of  bilateral infiltrates. " The chronic co-morbidities include hypertension hypothyroidism.   * I certify that at the point of admission it is my clinical judgment that the patient will require inpatient hospital care spanning beyond 2 midnights from the point of admission due to high intensity of service, high risk for further deterioration and high frequency of surveillance required.Lonia Blood*    Sophia Sperry,LAWAL MD [facility] Pager [phone]  If 7PM-7AM, please contact night-coverage [email or web address] Password [email or web address]  03/08/2019, 11:48 PM

## 2019-03-03 NOTE — ED Triage Notes (Signed)
Patient reports multiple emesis with fatigue , SOB and poor appetite onset this week . Covid + tested yesterday , low O2 sat at triage 86% .

## 2019-03-04 DIAGNOSIS — U071 COVID-19: Secondary | ICD-10-CM

## 2019-03-04 LAB — ABO/RH
ABO/RH(D): O POS
ABO/RH(D): O POS

## 2019-03-04 LAB — CBC WITH DIFFERENTIAL/PLATELET
Abs Immature Granulocytes: 0.05 10*3/uL (ref 0.00–0.07)
Basophils Absolute: 0 10*3/uL (ref 0.0–0.1)
Basophils Relative: 0 %
Eosinophils Absolute: 0 10*3/uL (ref 0.0–0.5)
Eosinophils Relative: 0 %
HCT: 41.8 % (ref 36.0–46.0)
Hemoglobin: 13.3 g/dL (ref 12.0–15.0)
Immature Granulocytes: 1 %
Lymphocytes Relative: 21 %
Lymphs Abs: 1.4 10*3/uL (ref 0.7–4.0)
MCH: 30 pg (ref 26.0–34.0)
MCHC: 31.8 g/dL (ref 30.0–36.0)
MCV: 94.1 fL (ref 80.0–100.0)
Monocytes Absolute: 0.6 10*3/uL (ref 0.1–1.0)
Monocytes Relative: 8 %
Neutro Abs: 4.9 10*3/uL (ref 1.7–7.7)
Neutrophils Relative %: 70 %
Platelets: 237 10*3/uL (ref 150–400)
RBC: 4.44 MIL/uL (ref 3.87–5.11)
RDW: 13.5 % (ref 11.5–15.5)
WBC: 6.8 10*3/uL (ref 4.0–10.5)
nRBC: 0 % (ref 0.0–0.2)

## 2019-03-04 LAB — COMPREHENSIVE METABOLIC PANEL
ALT: 18 U/L (ref 0–44)
AST: 45 U/L — ABNORMAL HIGH (ref 15–41)
Albumin: 2.7 g/dL — ABNORMAL LOW (ref 3.5–5.0)
Alkaline Phosphatase: 61 U/L (ref 38–126)
Anion gap: 14 (ref 5–15)
BUN: 20 mg/dL (ref 6–20)
CO2: 19 mmol/L — ABNORMAL LOW (ref 22–32)
Calcium: 7.6 mg/dL — ABNORMAL LOW (ref 8.9–10.3)
Chloride: 106 mmol/L (ref 98–111)
Creatinine, Ser: 0.96 mg/dL (ref 0.44–1.00)
GFR calc Af Amer: >60 mL/min (ref >60)
GFR calc non Af Amer: >60 mL/min (ref >60)
Glucose, Bld: 162 mg/dL — ABNORMAL HIGH (ref 70–99)
Potassium: 3.9 mmol/L (ref 3.5–5.1)
Sodium: 139 mmol/L (ref 135–145)
Total Bilirubin: 0.5 mg/dL (ref 0.3–1.2)
Total Protein: 5.8 g/dL — ABNORMAL LOW (ref 6.5–8.1)

## 2019-03-04 LAB — TYPE AND SCREEN
ABO/RH(D): O POS
Antibody Screen: NEGATIVE

## 2019-03-04 LAB — TROPONIN I (HIGH SENSITIVITY): Troponin I (High Sensitivity): 5 ng/L (ref <18)

## 2019-03-04 LAB — C-REACTIVE PROTEIN: CRP: 15.3 mg/dL — ABNORMAL HIGH (ref <1.0)

## 2019-03-04 LAB — POC SARS CORONAVIRUS 2 AG -  ED: SARS Coronavirus 2 Ag: POSITIVE — AB

## 2019-03-04 LAB — FERRITIN: Ferritin: 296 ng/mL (ref 11–307)

## 2019-03-04 LAB — HIV ANTIBODY (ROUTINE TESTING W REFLEX): HIV Screen 4th Generation wRfx: NONREACTIVE

## 2019-03-04 LAB — LACTIC ACID, PLASMA: Lactic Acid, Venous: 1.3 mmol/L (ref 0.5–1.9)

## 2019-03-04 LAB — HEPATITIS B SURFACE ANTIGEN: Hepatitis B Surface Ag: NONREACTIVE

## 2019-03-04 MED ORDER — LIP MEDEX EX OINT
TOPICAL_OINTMENT | CUTANEOUS | Status: DC | PRN
  Filled 2019-03-04: qty 7

## 2019-03-04 MED ORDER — TOCILIZUMAB 400 MG/20ML IV SOLN
800.0000 mg | Freq: Once | INTRAVENOUS | Status: AC
  Administered 2019-03-04: 800 mg via INTRAVENOUS
  Filled 2019-03-04: qty 40

## 2019-03-04 MED ORDER — SODIUM CHLORIDE 0.9 % IV SOLN
200.0000 mg | Freq: Once | INTRAVENOUS | Status: DC
  Filled 2019-03-04: qty 40

## 2019-03-04 MED ORDER — ALBUTEROL SULFATE HFA 108 (90 BASE) MCG/ACT IN AERS
2.0000 | INHALATION_SPRAY | Freq: Four times a day (QID) | RESPIRATORY_TRACT | Status: DC
  Administered 2019-03-04 – 2019-03-05 (×4): 2 via RESPIRATORY_TRACT
  Filled 2019-03-04: qty 6.7

## 2019-03-04 MED ORDER — SODIUM CHLORIDE 0.9% IV SOLUTION: Freq: Once | INTRAVENOUS | Status: DC

## 2019-03-04 MED ORDER — VITAMIN C 500 MG PO TABS
500.0000 mg | ORAL_TABLET | Freq: Every day | ORAL | Status: DC
  Administered 2019-03-04 – 2019-03-05 (×2): 500 mg via ORAL
  Filled 2019-03-04 (×2): qty 1

## 2019-03-04 MED ORDER — GUAIFENESIN-DM 100-10 MG/5ML PO SYRP
10.0000 mL | ORAL_SOLUTION | ORAL | Status: DC | PRN
  Administered 2019-03-04 (×2): 10 mL via ORAL
  Filled 2019-03-04 (×2): qty 10

## 2019-03-04 MED ORDER — SODIUM CHLORIDE 0.9 % IV SOLN
1.0000 g | INTRAVENOUS | Status: DC
  Administered 2019-03-04: 1 g via INTRAVENOUS
  Filled 2019-03-04: qty 10

## 2019-03-04 MED ORDER — DEXAMETHASONE SODIUM PHOSPHATE 10 MG/ML IJ SOLN
6.0000 mg | INTRAMUSCULAR | Status: AC
  Administered 2019-03-04 – 2019-03-12 (×9): 6 mg via INTRAVENOUS
  Filled 2019-03-04 (×9): qty 1

## 2019-03-04 MED ORDER — ALBUTEROL SULFATE HFA 108 (90 BASE) MCG/ACT IN AERS
2.0000 | INHALATION_SPRAY | Freq: Four times a day (QID) | RESPIRATORY_TRACT | Status: DC
  Administered 2019-03-04 (×2): 2 via RESPIRATORY_TRACT
  Filled 2019-03-04: qty 6.7

## 2019-03-04 MED ORDER — DICLOFENAC SODIUM 1 % TD GEL
2.0000 g | Freq: Three times a day (TID) | TRANSDERMAL | Status: DC
  Administered 2019-03-04 – 2019-03-12 (×26): 2 g via TOPICAL
  Filled 2019-03-04 (×4): qty 100

## 2019-03-04 MED ORDER — LEVOTHYROXINE SODIUM 100 MCG PO TABS
100.0000 ug | ORAL_TABLET | Freq: Every day | ORAL | Status: DC
  Administered 2019-03-04 – 2019-03-05 (×2): 100 ug via ORAL
  Filled 2019-03-04 (×2): qty 2
  Filled 2019-03-04: qty 1

## 2019-03-04 MED ORDER — SODIUM CHLORIDE 0.9 % IV SOLN
500.0000 mg | INTRAVENOUS | Status: DC
  Administered 2019-03-04: 500 mg via INTRAVENOUS
  Filled 2019-03-04: qty 500

## 2019-03-04 MED ORDER — ENOXAPARIN SODIUM 60 MG/0.6ML ~~LOC~~ SOLN: 55.0000 mg | SUBCUTANEOUS | Status: DC

## 2019-03-04 MED ORDER — SODIUM CHLORIDE 0.9 % IV SOLN
INTRAVENOUS | Status: DC
  Administered 2019-03-04 – 2019-03-05 (×3): via INTRAVENOUS

## 2019-03-04 MED ORDER — DEXAMETHASONE SODIUM PHOSPHATE 10 MG/ML IJ SOLN
6.0000 mg | INTRAMUSCULAR | Status: DC
  Administered 2019-03-04: 6 mg via INTRAVENOUS
  Filled 2019-03-04: qty 1

## 2019-03-04 MED ORDER — SODIUM CHLORIDE 0.9 % IV SOLN: 500.0000 mg | INTRAVENOUS | Status: DC

## 2019-03-04 MED ORDER — ONDANSETRON HCL 4 MG PO TABS: 4.0000 mg | ORAL_TABLET | Freq: Four times a day (QID) | ORAL | Status: DC | PRN

## 2019-03-04 MED ORDER — ENOXAPARIN SODIUM 60 MG/0.6ML ~~LOC~~ SOLN
0.5000 mg/kg | Freq: Two times a day (BID) | SUBCUTANEOUS | Status: DC
  Administered 2019-03-04 – 2019-03-06 (×4): 55 mg via SUBCUTANEOUS
  Filled 2019-03-04 (×4): qty 0.6

## 2019-03-04 MED ORDER — SODIUM CHLORIDE 0.9 % IV SOLN: 1.0000 g | INTRAVENOUS | Status: DC

## 2019-03-04 MED ORDER — ONDANSETRON HCL 4 MG/2ML IJ SOLN
4.0000 mg | Freq: Four times a day (QID) | INTRAMUSCULAR | Status: DC | PRN
  Administered 2019-03-04 (×2): 4 mg via INTRAVENOUS
  Filled 2019-03-04 (×2): qty 2

## 2019-03-04 MED ORDER — ENOXAPARIN SODIUM 40 MG/0.4ML ~~LOC~~ SOLN: 40.0000 mg | Freq: Every day | SUBCUTANEOUS | Status: DC

## 2019-03-04 MED ORDER — ACETAMINOPHEN 325 MG PO TABS
650.0000 mg | ORAL_TABLET | Freq: Four times a day (QID) | ORAL | Status: DC | PRN
  Administered 2019-03-04 (×2): 650 mg via ORAL
  Filled 2019-03-04 (×2): qty 2

## 2019-03-04 MED ORDER — SODIUM CHLORIDE 0.9 % IV SOLN: 100.0000 mg | Freq: Every day | INTRAVENOUS | Status: DC

## 2019-03-04 MED ORDER — SODIUM CHLORIDE 0.9% FLUSH
10.0000 mL | Freq: Two times a day (BID) | INTRAVENOUS | Status: DC
  Administered 2019-03-04 – 2019-03-06 (×5): 10 mL
  Administered 2019-03-06: 22:00:00 30 mL
  Administered 2019-03-07 – 2019-03-14 (×15): 10 mL

## 2019-03-04 MED ORDER — SODIUM CHLORIDE 0.9% FLUSH: 10.0000 mL | INTRAVENOUS | Status: DC | PRN

## 2019-03-04 MED ORDER — HYDROCOD POLST-CPM POLST ER 10-8 MG/5ML PO SUER
5.0000 mL | Freq: Two times a day (BID) | ORAL | Status: DC | PRN
  Administered 2019-03-04 – 2019-03-05 (×2): 5 mL via ORAL
  Filled 2019-03-04 (×2): qty 5

## 2019-03-04 MED ORDER — ZINC SULFATE 220 (50 ZN) MG PO CAPS
220.0000 mg | ORAL_CAPSULE | Freq: Every day | ORAL | Status: DC
  Administered 2019-03-04 – 2019-03-05 (×2): 220 mg via ORAL
  Filled 2019-03-04 (×2): qty 1

## 2019-03-04 NOTE — Progress Notes (Addendum)
PROGRESS NOTE  Brief Narrative: Deborah Bond is a 58 y.o. female with a history of HTN, hypothyroidism, depression, and obesity (BMI 8) with recently diagnosed covid-19, sent home on amoxicillin and prednisone only to return with progressive dyspnea and cough with fever on 12/8. She was hypoxic requiring 6L O2 initially with bilateral infiltrates on CXR. Remdesivir and steroids were given and the patient transferred to [facility] 12/9. She remains tachypneic and hypoxia has worsened, currently requiring NRB to maintain SpO2 88-90%.   Subjective: Feels subjectively about the same from admission, but needing more oxygen to feel that way. No chest pain. No leg swelling.  Objective: BP (!) 130/48   Pulse 85   Temp 99.6 F (37.6 C) (Axillary)   Resp (!) 39   Wt 107 kg   SpO2 (!) 86%   BMI 44.57 kg/m   Gen: Nontoxic female Pulm: Crackles diffusely without wheezes, 30/min. No accessory muscle use.  CV: RRR, no murmur, no JVD, no edema GI: Soft, NT, ND, +BS  Neuro: Alert and oriented. No focal deficits. Skin: No rashes, lesions or ulcers  Assessment & Plan: Principal Problem:   Acute respiratory failure with hypoxia (HCC) Active Problems:   DDD (degenerative disc disease), lumbar   Acquired hypothyroidism   Pure hypercholesterolemia   Depression, major, single episode, moderate (HCC)   Pneumonia due to COVID-19 virus   Hypokalemia   Hyponatremia   COVID-19 virus infection  Acute hypoxic respiratory failure due to covid-19 pneumonia: Worsening since admission.  CRP remains elevated 15.6 > 15.3 despite initiation of IV treatments, and is also in setting of having taken prednisone as outpatient. PCT neg at 0.14.  - I'm very concerned for this patient's potential to decompensate/continue worsening. Will update status to PCU. D/w pt the steps that would be taken were hypoxia to worsen or respiratory distress to develop (up to and including intubation). She confirmed desire for these  measures.  - Continue remdesivir and steroids.  - No hx TB, hepatitis, and no other exclusion criteria apply for off-label use of tocilizumab. She is clearly at the hyperinflammatory stage of illness and the potential risks and benefits of administering this medication was discussed exhaustively with the patient who opts to proceed. 8mg /kg IV x1 ordered. Will monitor for secondary infections and LFT elevations.  - Discussed convalescent plasma use under EUA. All questions answered, consent form reviewed and signed. This will be administered as well.  - Give lovenox 0.5mg /kg q12h given severity of illness.  - Prone as able, IS, FV. - I spoke with the patient's husband to update him on progressing hypoxia.  Patrecia Pour, MD Pager on amion 03/04/2019, 4:28 PM    Total critical care time: 35 minutes  Due to a high probability of clinically significant, life threatening deterioration, the patient required my highest level of preparedness to intervene emergently and I personally spent this critical care time directly and personally managing the patient. This critical care time included obtaining a history; examining the patient; pulse oximetry; ordering and review of studies; arranging urgent treatment with development of a management plan; evaluation of patient's response to treatment; frequent reassessment; and, discussions with other providers. This critical care time was performed to assess and manage the high probability of imminent, life-threatening deterioration that could result in multi-organ failure. It was exclusive of separately billable procedures and treating other patients and teaching time.

## 2019-03-04 NOTE — ED Notes (Signed)
Patient poc covid 19 was postive report to [place] .PA AND TINA CHARGE

## 2019-03-04 NOTE — ED Notes (Signed)
Currently on 15LO2, Dr. Baltazar Najjar aware.

## 2019-03-04 NOTE — Progress Notes (Signed)
Ok to increase lovenox to 0.5mg /kg/day and resume her synthroid per Dr. Bonner Puna.  Onnie Boer, PharmD, BCIDP, AAHIVP, CPP Infectious Disease Pharmacist 03/04/2019 12:18 PM

## 2019-03-04 NOTE — Progress Notes (Addendum)
PROGRESS NOTE    Deborah Bond  CBJ:[id number] DOB: 1960-05-17 DOA: 2019/03/15 PCP: Terald Sleeper, PA-C   Brief Narrative: Deborah Bond is a 58 y.o. female with medical history significant of hypertension, hypothyroidism, depression, tenosynovitis and restless leg syndrome. Patient presented secondary to dyspnea and cough in setting of COVID-19 pneumonia. Started on Remdesivir and Decadron on admission in addition to supplemental oxygen.   Assessment & Plan:   Principal Problem:   Acute respiratory failure with hypoxia (HCC) Active Problems:   DDD (degenerative disc disease), lumbar   Acquired hypothyroidism   Pure hypercholesterolemia   Depression, major, single episode, moderate (HCC)   Pneumonia due to COVID-19 virus   Hypokalemia   Hyponatremia   COVID-19 virus infection   Acute respiratory failure with hypoxia Secondary to COVID-19 infection. Initially on 4 L, up to 15 L via NRB. Breathing comfortably with tachypnea. Was recently started on a prednisone taper. -Keep O2 >92% -Continue supplemental oxygen  COVID-19 pneumonia Patient with hypoxia. Started on Remdesivir and dexamethasone. Elevated CRP of 15.3. AST mildly elevaetd at 45. No leukocytosis. -Continue Remdesivir, Decadron IV -Daily CBC, CMP, CRP, D-dimer, Ferritin -Likely a candidate for Actemra with worsening hypoxia/elevated CRP  Hypothyroidism -Restart home Synthroid  Hypokalemia Repleted. Resolved. Stable.  Hyponatremia Resolved with IV fluids.  Hyperlipidemia -Hold Lipitor while on Remdesivir.  Depression On Lexapro. Likely okay to restart but will follow-up with pharmacy.   DVT prophylaxis: Lovenox Code Status:   Code Status: Full Code Family Communication: Husband on telephone Disposition Plan: Transfer to [facility]   Consultants:   None  Procedures:   None  Antimicrobials:  Remdesivir (12/9>>    Subjective: Dyspnea overnight. Requiring more oxygen.  Objective:  Vitals:   03/04/19 0900 03/04/19 0930 03/04/19 1000 03/04/19 1015  BP: 139/73 130/68 132/65 138/72  Pulse: 80 88 90 84  Resp:   (!) 28   Temp:      TempSrc:      SpO2: (!) 89% 90%  97%    Intake/Output Summary (Last 24 hours) at 03/04/2019 1029 Last data filed at 03/04/2019 0601 Gross per 24 hour  Intake 1600 ml  Output -  Net 1600 ml   There were no vitals filed for this visit.  Examination:  General exam: Appears calm and comfortable  Respiratory system: Respiratory effort increased with tachypnea and mild accessory muscle usage. Gastrointestinal system: Abdomen is nondistended Central nervous system: Alert Skin: No cyanosis. No rashes Psychiatry: Not anxious    Data Reviewed: I have personally reviewed following labs and imaging studies  CBC: Recent Labs  Lab 03/15/2019 2117 03/04/19 0731  WBC 6.8 6.8  NEUTROABS 4.7 4.9  HGB 13.2 13.3  HCT 41.7 41.8  MCV 93.5 94.1  PLT 247 160   Basic Metabolic Panel: Recent Labs  Lab 03-15-2019 2117 03/04/19 0731  NA 134* 139  K 3.4* 3.9  CL 102 106  CO2 20* 19*  GLUCOSE 141* 162*  BUN 26* 20  CREATININE 1.14* 0.96  CALCIUM 7.9* 7.6*   GFR: CrCl cannot be calculated (Unknown ideal weight.). Liver Function Tests: Recent Labs  Lab 03/15/19 2117 03/04/19 0731  AST 35 45*  ALT 18 18  ALKPHOS 69 61  BILITOT 0.4 0.5  PROT 6.6 5.8*  ALBUMIN 2.8* 2.7*   No results for input(s): LIPASE, AMYLASE in the last 168 hours. No results for input(s): AMMONIA in the last 168 hours. Coagulation Profile: No results for input(s): INR, PROTIME in the last 168 hours. Cardiac Enzymes:  No results for input(s): CKTOTAL, CKMB, CKMBINDEX, TROPONINI in the last 168 hours. BNP (last 3 results) No results for input(s): PROBNP in the last 8760 hours. HbA1C: No results for input(s): HGBA1C in the last 72 hours. CBG: No results for input(s): GLUCAP in the last 168 hours. Lipid Profile: Recent Labs    03-22-2019 2132  TRIG 160*    Thyroid Function Tests: No results for input(s): TSH, T4TOTAL, FREET4, T3FREE, THYROIDAB in the last 72 hours. Anemia Panel: Recent Labs    03-22-2019 2117 03/04/19 0731  FERRITIN 298 296   Sepsis Labs: Recent Labs  Lab 03-22-19 2117 03/22/2019 2132 03/04/19 0036  PROCALCITON 0.14  --   --   LATICACIDVEN  --  1.4 1.3    No results found for this or any previous visit (from the past 240 hour(s)).       Radiology Studies: Dg Chest Portable 1 View  Result Date: 03-22-19 CLINICAL DATA:  COVID (+), shortness of breath. Additional provided: Multiple episodes of emesis with fatigue, shortness of breath and poor appetite. EXAM: PORTABLE CHEST 1 VIEW COMPARISON:  Report from chest radiograph 11/01/1998 (images unavailable). FINDINGS: Shallow inspiration radiograph which extension weights the cardiac silhouette and limits evaluation of heart size. Aortic atherosclerosis Prominent bilateral airspace opacities within mid to lower lung predominance, likely reflecting pneumonia given provided history. There is also nonspecific diffuse bilateral interstitial prominence, which may reflect the same process. No sizable pleural effusion or pneumothorax. No acute bony abnormality. Spinal stimulator leads project over the lower thoracic spine. IMPRESSION: Prominent bilateral airspace opacities with a mid to lower lung predominance, likely reflecting pneumonia given provided history. Electronically Signed   By: Jackey Loge DO   On: Mar 22, 2019 21:53        Scheduled Meds: . albuterol  2 puff Inhalation Q6H  . dexamethasone (DECADRON) injection  6 mg Intravenous Q24H  . diclofenac sodium  2 g Topical TID AC & HS  . enoxaparin (LOVENOX) injection  40 mg Subcutaneous Daily  . vitamin C  500 mg Oral Daily  . zinc sulfate  220 mg Oral Daily   Continuous Infusions: . sodium chloride 100 mL/hr at 03/04/19 0401  . azithromycin Stopped (03/04/19 0601)  . cefTRIAXone (ROCEPHIN)  IV Stopped (03/04/19  0504)  . [START ON 03/05/2019] remdesivir 100 mg in NS 100 mL       LOS: 1 day     Jacquelin Hawking, MD [facility] 03/04/2019, 10:29 AM  If 7PM-7AM, please contact night-coverage [email or web address]

## 2019-03-04 NOTE — Progress Notes (Signed)
RN requested RT to assess pt for possible need for HHFNC. Pt on 15L NRB with SATs maintaining between 89-91%. Pt is awake and appears to be resting comfortably on the NRB at this time. RR 26-28. HR stable in 80s, BP 127/63 in the semi fowlers position. RT will leave pt on NRB 15L at this time. RN aware to call respiratory if there are any changes in pt's status or a further decrease in SATs for further intervention.

## 2019-03-04 NOTE — ED Notes (Signed)
Tele   Breakfast ordered  

## 2019-03-04 NOTE — ED Notes (Signed)
This RN notified RT that patient requiring 15L NRB.

## 2019-03-05 ENCOUNTER — Inpatient Hospital Stay (HOSPITAL_COMMUNITY): Payer: BC Managed Care – PPO

## 2019-03-05 DIAGNOSIS — J1289 Other viral pneumonia: Secondary | ICD-10-CM

## 2019-03-05 DIAGNOSIS — E039 Hypothyroidism, unspecified: Secondary | ICD-10-CM

## 2019-03-05 DIAGNOSIS — J9601 Acute respiratory failure with hypoxia: Secondary | ICD-10-CM

## 2019-03-05 DIAGNOSIS — U071 COVID-19: Principal | ICD-10-CM

## 2019-03-05 DIAGNOSIS — E876 Hypokalemia: Secondary | ICD-10-CM

## 2019-03-05 DIAGNOSIS — R0602 Shortness of breath: Secondary | ICD-10-CM

## 2019-03-05 LAB — PHOSPHORUS
Phosphorus: 2.7 mg/dL (ref 2.5–4.6)
Phosphorus: 2.6 mg/dL (ref 2.5–4.6)

## 2019-03-05 LAB — ECHOCARDIOGRAM COMPLETE
Height: 60 in
Weight: 3774.28 oz

## 2019-03-05 LAB — POCT I-STAT 7, (LYTES, BLD GAS, ICA,H+H)
Acid-base deficit: 3 mmol/L — ABNORMAL HIGH (ref 0.0–2.0)
Acid-base deficit: 7 mmol/L — ABNORMAL HIGH (ref 0.0–2.0)
Bicarbonate: 23.1 mmol/L (ref 20.0–28.0)
Bicarbonate: 24.2 mmol/L (ref 20.0–28.0)
Calcium, Ion: 1.26 mmol/L (ref 1.15–1.40)
Calcium, Ion: 1.27 mmol/L (ref 1.15–1.40)
HCT: 40 % (ref 36.0–46.0)
HCT: 41 % (ref 36.0–46.0)
Hemoglobin: 13.6 g/dL (ref 12.0–15.0)
Hemoglobin: 13.9 g/dL (ref 12.0–15.0)
O2 Saturation: 55 %
O2 Saturation: 91 %
Patient temperature: 98.6
Potassium: 3.6 mmol/L (ref 3.5–5.1)
Potassium: 3.5 mmol/L (ref 3.5–5.1)
Sodium: 142 mmol/L (ref 135–145)
Sodium: 143 mmol/L (ref 135–145)
TCO2: 25 mmol/L (ref 22–32)
TCO2: 26 mmol/L (ref 22–32)
pCO2 arterial: 45.8 mmHg (ref 32.0–48.0)
pCO2 arterial: 74.8 mmHg (ref 32.0–48.0)
pH, Arterial: 7.311 — ABNORMAL LOW (ref 7.350–7.450)
pH, Arterial: 7.118 — CL (ref 7.350–7.450)
pO2, Arterial: 32 mmHg — CL (ref 83.0–108.0)
pO2, Arterial: 82 mmHg — ABNORMAL LOW (ref 83.0–108.0)

## 2019-03-05 LAB — CBC WITH DIFFERENTIAL/PLATELET
Abs Immature Granulocytes: 0.08 10*3/uL — ABNORMAL HIGH (ref 0.00–0.07)
Basophils Absolute: 0 10*3/uL (ref 0.0–0.1)
Basophils Relative: 0 %
Eosinophils Absolute: 0 10*3/uL (ref 0.0–0.5)
Eosinophils Relative: 0 %
HCT: 43.9 % (ref 36.0–46.0)
Hemoglobin: 13.4 g/dL (ref 12.0–15.0)
Immature Granulocytes: 1 %
Lymphocytes Relative: 9 %
Lymphs Abs: 0.7 10*3/uL (ref 0.7–4.0)
MCH: 29.2 pg (ref 26.0–34.0)
MCHC: 30.5 g/dL (ref 30.0–36.0)
MCV: 95.6 fL (ref 80.0–100.0)
Monocytes Absolute: 0.2 10*3/uL (ref 0.1–1.0)
Monocytes Relative: 2 %
Neutro Abs: 6.9 10*3/uL (ref 1.7–7.7)
Neutrophils Relative %: 88 %
Platelets: 259 10*3/uL (ref 150–400)
RBC: 4.59 MIL/uL (ref 3.87–5.11)
RDW: 13.9 % (ref 11.5–15.5)
WBC: 7.9 10*3/uL (ref 4.0–10.5)
nRBC: 0 % (ref 0.0–0.2)

## 2019-03-05 LAB — COMPREHENSIVE METABOLIC PANEL
ALT: 28 U/L (ref 0–44)
AST: 56 U/L — ABNORMAL HIGH (ref 15–41)
Albumin: 3 g/dL — ABNORMAL LOW (ref 3.5–5.0)
Alkaline Phosphatase: 75 U/L (ref 38–126)
Anion gap: 11 (ref 5–15)
BUN: 22 mg/dL — ABNORMAL HIGH (ref 6–20)
CO2: 24 mmol/L (ref 22–32)
Calcium: 8.4 mg/dL — ABNORMAL LOW (ref 8.9–10.3)
Chloride: 107 mmol/L (ref 98–111)
Creatinine, Ser: 0.78 mg/dL (ref 0.44–1.00)
GFR calc Af Amer: >60 mL/min (ref >60)
GFR calc non Af Amer: >60 mL/min (ref >60)
Glucose, Bld: 156 mg/dL — ABNORMAL HIGH (ref 70–99)
Potassium: 4.1 mmol/L (ref 3.5–5.1)
Sodium: 142 mmol/L (ref 135–145)
Total Bilirubin: 0.5 mg/dL (ref 0.3–1.2)
Total Protein: 6.9 g/dL (ref 6.5–8.1)

## 2019-03-05 LAB — BPAM FFP
Blood Product Expiration Date: 202012101737
ISSUE DATE / TIME: 202012091741
Unit Type and Rh: 5100

## 2019-03-05 LAB — FERRITIN: Ferritin: 340 ng/mL — ABNORMAL HIGH (ref 11–307)

## 2019-03-05 LAB — GLUCOSE, CAPILLARY
Glucose-Capillary: 153 mg/dL — ABNORMAL HIGH (ref 70–99)
Glucose-Capillary: 206 mg/dL — ABNORMAL HIGH (ref 70–99)
Glucose-Capillary: 194 mg/dL — ABNORMAL HIGH (ref 70–99)
Glucose-Capillary: 163 mg/dL — ABNORMAL HIGH (ref 70–99)
Glucose-Capillary: 172 mg/dL — ABNORMAL HIGH (ref 70–99)

## 2019-03-05 LAB — PREPARE FRESH FROZEN PLASMA: Unit division: 0

## 2019-03-05 LAB — C-REACTIVE PROTEIN: CRP: 15.7 mg/dL — ABNORMAL HIGH (ref <1.0)

## 2019-03-05 LAB — MAGNESIUM
Magnesium: 2.2 mg/dL (ref 1.7–2.4)
Magnesium: 2 mg/dL (ref 1.7–2.4)

## 2019-03-05 LAB — HEPATITIS B SURFACE ANTIBODY, QUANTITATIVE: Hep B S AB Quant (Post): <3.1 m[IU]/mL — ABNORMAL LOW (ref >9.9)

## 2019-03-05 LAB — BRAIN NATRIURETIC PEPTIDE: B Natriuretic Peptide: 129.6 pg/mL — ABNORMAL HIGH (ref 0.0–100.0)

## 2019-03-05 LAB — D-DIMER, QUANTITATIVE: D-Dimer, Quant: 1.93 ug/mL-FEU — ABNORMAL HIGH (ref 0.00–0.50)

## 2019-03-05 MED ORDER — ALPRAZOLAM 0.25 MG PO TABS
0.2500 mg | ORAL_TABLET | Freq: Three times a day (TID) | ORAL | Status: DC | PRN
  Administered 2019-03-05: 0.25 mg via ORAL
  Filled 2019-03-05: qty 1

## 2019-03-05 MED ORDER — PRO-STAT SUGAR FREE PO LIQD
30.0000 mL | Freq: Two times a day (BID) | ORAL | Status: DC
  Administered 2019-03-05: 09:00:00 30 mL
  Filled 2019-03-05: qty 30

## 2019-03-05 MED ORDER — INSULIN ASPART 100 UNIT/ML ~~LOC~~ SOLN: 0.0000 [IU] | Freq: Every day | SUBCUTANEOUS | Status: DC

## 2019-03-05 MED ORDER — VITAL HIGH PROTEIN PO LIQD
1000.0000 mL | ORAL | Status: DC
  Administered 2019-03-05: 1000 mL

## 2019-03-05 MED ORDER — FUROSEMIDE 10 MG/ML IJ SOLN
40.0000 mg | Freq: Once | INTRAMUSCULAR | Status: AC
  Administered 2019-03-05: 14:00:00 40 mg via INTRAVENOUS
  Filled 2019-03-05: qty 4

## 2019-03-05 MED ORDER — ZINC SULFATE 220 (50 ZN) MG PO CAPS
220.0000 mg | ORAL_CAPSULE | Freq: Every day | ORAL | Status: DC
  Administered 2019-03-06 – 2019-03-20 (×15): 220 mg
  Filled 2019-03-05 (×15): qty 1

## 2019-03-05 MED ORDER — FENTANYL BOLUS VIA INFUSION
50.0000 ug | INTRAVENOUS | Status: DC | PRN
  Administered 2019-03-05 – 2019-03-13 (×17): 50 ug via INTRAVENOUS
  Filled 2019-03-05: qty 50

## 2019-03-05 MED ORDER — MIDAZOLAM HCL 2 MG/2ML IJ SOLN
INTRAMUSCULAR | Status: AC
  Administered 2019-03-05: 09:00:00 2 mg via INTRAVENOUS
  Filled 2019-03-05: qty 4

## 2019-03-05 MED ORDER — FENTANYL CITRATE (PF) 100 MCG/2ML IJ SOLN: 100.0000 ug | Freq: Once | INTRAMUSCULAR | Status: DC

## 2019-03-05 MED ORDER — INSULIN ASPART 100 UNIT/ML ~~LOC~~ SOLN
0.0000 [IU] | Freq: Four times a day (QID) | SUBCUTANEOUS | Status: DC
  Administered 2019-03-05 – 2019-03-06 (×2): 3 [IU] via SUBCUTANEOUS

## 2019-03-05 MED ORDER — VITAL HIGH PROTEIN PO LIQD
1000.0000 mL | ORAL | Status: AC
  Administered 2019-03-05: 1000 mL

## 2019-03-05 MED ORDER — ORAL CARE MOUTH RINSE
15.0000 mL | OROMUCOSAL | Status: DC
  Administered 2019-03-05 – 2019-03-21 (×155): 15 mL via OROMUCOSAL

## 2019-03-05 MED ORDER — VITAL 1.5 CAL PO LIQD
1000.0000 mL | ORAL | Status: DC
  Administered 2019-03-05 – 2019-03-09 (×3): 1000 mL

## 2019-03-05 MED ORDER — ETOMIDATE 2 MG/ML IV SOLN: 20.0000 mg | Freq: Once | INTRAVENOUS | Status: DC

## 2019-03-05 MED ORDER — MIDAZOLAM HCL 2 MG/2ML IJ SOLN
2.0000 mg | INTRAMUSCULAR | Status: DC | PRN
  Administered 2019-03-05 – 2019-03-10 (×5): 2 mg via INTRAVENOUS

## 2019-03-05 MED ORDER — ROCURONIUM BROMIDE 50 MG/5ML IV SOLN
100.0000 mg | Freq: Once | INTRAVENOUS | Status: DC
  Filled 2019-03-05: qty 10

## 2019-03-05 MED ORDER — FENTANYL CITRATE (PF) 100 MCG/2ML IJ SOLN: 50.0000 ug | Freq: Once | INTRAMUSCULAR | Status: DC

## 2019-03-05 MED ORDER — PRO-STAT SUGAR FREE PO LIQD
60.0000 mL | Freq: Three times a day (TID) | ORAL | Status: DC
  Administered 2019-03-05 – 2019-03-12 (×21): 60 mL
  Filled 2019-03-05 (×23): qty 60

## 2019-03-05 MED ORDER — FAMOTIDINE 40 MG/5ML PO SUSR
20.0000 mg | Freq: Two times a day (BID) | ORAL | Status: DC
  Administered 2019-03-05 – 2019-03-18 (×27): 20 mg
  Filled 2019-03-05 (×26): qty 2.5

## 2019-03-05 MED ORDER — SUCCINYLCHOLINE CHLORIDE 200 MG/10ML IV SOSY
PREFILLED_SYRINGE | INTRAVENOUS | Status: AC
  Filled 2019-03-05: qty 10

## 2019-03-05 MED ORDER — CHLORHEXIDINE GLUCONATE 0.12% ORAL RINSE (MEDLINE KIT)
15.0000 mL | Freq: Two times a day (BID) | OROMUCOSAL | Status: DC
  Administered 2019-03-05 – 2019-03-21 (×32): 15 mL via OROMUCOSAL

## 2019-03-05 MED ORDER — MIDAZOLAM HCL 2 MG/2ML IJ SOLN
2.0000 mg | Freq: Once | INTRAMUSCULAR | Status: AC
  Filled 2019-03-05: qty 2

## 2019-03-05 MED ORDER — FENTANYL 2500MCG IN NS 250ML (10MCG/ML) PREMIX INFUSION
50.0000 ug/h | INTRAVENOUS | Status: DC
  Administered 2019-03-05: 50 ug/h via INTRAVENOUS
  Administered 2019-03-05 – 2019-03-07 (×2): 200 ug/h via INTRAVENOUS
  Administered 2019-03-07: 150 ug/h via INTRAVENOUS
  Administered 2019-03-08 – 2019-03-11 (×7): 200 ug/h via INTRAVENOUS
  Administered 2019-03-11: 150 ug/h via INTRAVENOUS
  Administered 2019-03-13: 01:00:00 50 ug/h via INTRAVENOUS
  Filled 2019-03-05 (×14): qty 250

## 2019-03-05 MED ORDER — FENTANYL CITRATE (PF) 100 MCG/2ML IJ SOLN
INTRAMUSCULAR | Status: AC
  Filled 2019-03-05: qty 2

## 2019-03-05 MED ORDER — VECURONIUM BROMIDE 10 MG IV SOLR
INTRAVENOUS | Status: AC
  Filled 2019-03-05: qty 10

## 2019-03-05 MED ORDER — ETOMIDATE 2 MG/ML IV SOLN
INTRAVENOUS | Status: AC
  Filled 2019-03-05: qty 20

## 2019-03-05 MED ORDER — ROCURONIUM BROMIDE 10 MG/ML (PF) SYRINGE
PREFILLED_SYRINGE | INTRAVENOUS | Status: AC
  Administered 2019-03-05: 100 mg
  Filled 2019-03-05: qty 10

## 2019-03-05 MED ORDER — PROPOFOL 10 MG/ML IV BOLUS
INTRAVENOUS | Status: AC
  Filled 2019-03-05: qty 20

## 2019-03-05 MED ORDER — POTASSIUM CHLORIDE 20 MEQ/15ML (10%) PO SOLN
20.0000 meq | Freq: Once | ORAL | Status: AC
  Administered 2019-03-05: 20 meq
  Filled 2019-03-05: qty 15

## 2019-03-05 MED ORDER — VITAL HIGH PROTEIN PO LIQD: 1000.0000 mL | ORAL | Status: DC

## 2019-03-05 MED ORDER — INSULIN ASPART 100 UNIT/ML ~~LOC~~ SOLN: 0.0000 [IU] | Freq: Three times a day (TID) | SUBCUTANEOUS | Status: DC

## 2019-03-05 MED ORDER — CHLORHEXIDINE GLUCONATE CLOTH 2 % EX PADS
6.0000 | MEDICATED_PAD | Freq: Every day | CUTANEOUS | Status: DC
  Administered 2019-03-05 – 2019-03-20 (×17): 6 via TOPICAL

## 2019-03-05 MED ORDER — STERILE WATER FOR INJECTION IJ SOLN
INTRAMUSCULAR | Status: AC
  Filled 2019-03-05: qty 10

## 2019-03-05 MED ORDER — MIDAZOLAM HCL 2 MG/2ML IJ SOLN
2.0000 mg | INTRAMUSCULAR | Status: AC | PRN
  Administered 2019-03-05 (×3): 2 mg via INTRAVENOUS
  Filled 2019-03-05: qty 2

## 2019-03-05 MED ORDER — MIDAZOLAM 50MG/50ML (1MG/ML) PREMIX INFUSION
1.0000 mg/h | INTRAVENOUS | Status: DC
  Administered 2019-03-05: 12:00:00 0.5 mg/h via INTRAVENOUS
  Administered 2019-03-05 – 2019-03-06 (×2): 6 mg/h via INTRAVENOUS
  Administered 2019-03-06: 22:00:00 7 mg/h via INTRAVENOUS
  Administered 2019-03-07: 5 mg/h via INTRAVENOUS
  Administered 2019-03-07: 05:00:00 8 mg/h via INTRAVENOUS
  Administered 2019-03-08 (×2): 10 mg/h via INTRAVENOUS
  Administered 2019-03-08: 01:00:00 8 mg/h via INTRAVENOUS
  Administered 2019-03-08: 08:00:00 10 mg/h via INTRAVENOUS
  Administered 2019-03-09: 9 mg/h via INTRAVENOUS
  Administered 2019-03-09 (×4): 10 mg/h via INTRAVENOUS
  Administered 2019-03-10: 6 mg/h via INTRAVENOUS
  Administered 2019-03-10: 10 mg/h via INTRAVENOUS
  Administered 2019-03-10 – 2019-03-11 (×2): 6 mg/h via INTRAVENOUS
  Filled 2019-03-05 (×20): qty 50

## 2019-03-05 MED ORDER — ASCORBIC ACID 500 MG PO TABS
500.0000 mg | ORAL_TABLET | Freq: Every day | ORAL | Status: DC
  Administered 2019-03-06 – 2019-03-20 (×15): 500 mg
  Filled 2019-03-05 (×15): qty 1

## 2019-03-05 MED ORDER — LEVOTHYROXINE SODIUM 100 MCG PO TABS
100.0000 ug | ORAL_TABLET | Freq: Every day | ORAL | Status: DC
  Administered 2019-03-06 – 2019-03-21 (×14): 100 ug
  Filled 2019-03-05 (×16): qty 1

## 2019-03-05 NOTE — Progress Notes (Signed)
NAME:  Deborah Bond, MRN:  [id number], DOB:  [birth year], LOS: 2 ADMISSION DATE:  03/14/2019, CONSULTATION DATE:  12/10 REFERRING MD:  Deborah Bond, CHIEF COMPLAINT:  Dyspnea  Brief History   58 y/o female admitted on 12/8 with dyspnea, cough and hypoxemia due to COVID 19.  Moved to [facility] on 12/9.  Early in the AM on 12/10 she was moved to the ICU for worsening hypoxemia.    History of present illness   See details above.  She was admitted to this facility yesterday, treated with Actemra, remdesivir and steroids.  Overnight she had worsening hypoxemia and was moved to the intensive care unit.  This morning she was noted to be profoundly hypoxemic with an O2 saturation between 40 and 60% while on a heated high flow device at maximum capacity and a nonrebreather.  She consented to intubation and mechanical ventilation.  Past Medical History  Hyperlipidemia Thyroid disease Depression  Significant Hospital Events   12/9 admission to [facility] 12/10 ICU transfer, intubation  Consults:  PCCM  Procedures:  12/10 PCCM>   Significant Diagnostic Tests:    Micro Data:  12/8 POC SARS COV 2 > positive 12/8 blood >   Antimicrobials/COVID Rx:  12/8 ceftriaxone x1 12/8 azithromycin x1 12/8 remdesivir >  12/8 decadron >  12/9 tocilizumab x1  12/9 CCP x1  Interim history/subjective:  As above  Objective   Blood pressure (!) 167/69, pulse 97, temperature (!) 97 F (36.1 C), temperature source Oral, resp. rate (!) 30, weight 107 kg, SpO2 (!) 74 %.        Intake/Output Summary (Last 24 hours) at 03/05/2019 0759 Last data filed at 03/04/2019 2200 Gross per 24 hour  Intake 786 ml  Output 400 ml  Net 386 ml   Filed Weights   03/04/19 1200  Weight: 107 kg    Examination:  General:  Increased work of breathing in bed HENT: NCAT OP clear PULM: CTA B, increased respiratory effort CV: tachy, regular, no mgr GI: BS+, soft, nontender MSK: normal bulk and tone Neuro: awake, alert, no  distress, MAEW  12/10 CXR > bilateral airspace disease, ETT in place  Resolved Hospital Problem list     Assessment & Plan:  ARDS due to COVID 19 pneumonia>  ddx includes acute pulmonary edema Intubate Decadron 10 days remdesivir 5 days Continue mechanical ventilation per ARDS protocol Target TVol 6-8cc/kgIBW Target Plateau Pressure < 30cm H20 Target driving pressure less than 15 cm of water Target PaO2 55-65: titrate PEEP/FiO2 per protocol As long as PaO2 to FiO2 ratio is less than 1:150 position in prone position for 16 hours a day Check CVP daily if CVL in place Target CVP less than 4, diurese as necessary Ventilator associated pneumonia prevention protocol Check proBNP Check echo  Need for sedation for mechanical ventilation PAD protocol RASS target -2 to -3   Best practice:  Diet: start tube feeding Pain/Anxiety/Delirium protocol (if indicated): yes, as above VAP protocol (if indicated): yes DVT prophylaxis: lovenox GI prophylaxis: famotidine Glucose control: per TRH Mobility: bed rest Code Status: full Family Communication: updated her husband by phone today Disposition: remain in ICU  Labs   CBC: Recent Labs  Lab 02/28/2019 2117 03/04/19 0731 03/05/19 0305 03/05/19 0614  WBC 6.8 6.8 7.9  --   NEUTROABS 4.7 4.9 6.9  --   HGB 13.2 13.3 13.4 13.6  HCT 41.7 41.8 43.9 40.0  MCV 93.5 94.1 95.6  --   PLT 247 237 259  --  Basic Metabolic Panel: Recent Labs  Lab 03/16/2019 2117 03/04/19 0731 03/05/19 0305 03/05/19 0614  NA 134* 139 142 142  K 3.4* 3.9 4.1 3.6  CL 102 106 107  --   CO2 20* 19* 24  --   GLUCOSE 141* 162* 156*  --   BUN 26* 20 22*  --   CREATININE 1.14* 0.96 0.78  --   CALCIUM 7.9* 7.6* 8.4*  --    GFR: Estimated Creatinine Clearance: 85.5 mL/min (by C-G formula based on SCr of 0.78 mg/dL). Recent Labs  Lab 03/04/2019 2117 03/22/2019 2132 03/04/19 0036 03/04/19 0731 03/05/19 0305  PROCALCITON 0.14  --   --   --   --   WBC 6.8   --   --  6.8 7.9  LATICACIDVEN  --  1.4 1.3  --   --     Liver Function Tests: Recent Labs  Lab 03/26/2019 2117 03/04/19 0731 03/05/19 0305  AST 35 45* 56*  ALT 18 18 28   ALKPHOS 69 61 75  BILITOT 0.4 0.5 0.5  PROT 6.6 5.8* 6.9  ALBUMIN 2.8* 2.7* 3.0*   No results for input(s): LIPASE, AMYLASE in the last 168 hours. No results for input(s): AMMONIA in the last 168 hours.  ABG    Component Value Date/Time   PHART 7.311 (L) 03/05/2019 0614   PCO2ART 45.8 03/05/2019 0614   PO2ART 32.0 (LL) 03/05/2019 0614   HCO3 23.1 03/05/2019 0614   TCO2 25 03/05/2019 0614   ACIDBASEDEF 3.0 (H) 03/05/2019 0614   O2SAT 55.0 03/05/2019 0614     Coagulation Profile: No results for input(s): INR, PROTIME in the last 168 hours.  Cardiac Enzymes: No results for input(s): CKTOTAL, CKMB, CKMBINDEX, TROPONINI in the last 168 hours.  HbA1C: HB A1C (BAYER DCA - WAIVED)  Date/Time Value Ref Range Status  12/09/2018 08:41 AM 5.7 <7.0 % Final    Comment:                                          Diabetic Adult            <7.0                                       Healthy Adult        4.3 - 5.7                                                           (DCCT/NGSP) American Diabetes Association's Summary of Glycemic Recommendations for Adults with Diabetes: Hemoglobin A1c <7.0%. More stringent glycemic goals (A1c <6.0%) may further reduce complications at the cost of increased risk of hypoglycemia.     CBG: No results for input(s): GLUCAP in the last 168 hours.  Review of Systems:   Could not obtain due to severe respiratory failure  Past Medical History  She,  has a past medical history of Depression, Hyperlipidemia, and Thyroid disease.   Surgical History    Past Surgical History:  Procedure Laterality Date  . ABDOMINAL HYSTERECTOMY    . BACK SURGERY    . THYROID SURGERY  Social History   reports that she has quit smoking. She has never used smokeless tobacco. She reports  that she does not drink alcohol or use drugs.   Family History   Her family history includes Cancer in her father and mother.   Allergies No Known Allergies   Home Medications  Prior to Admission medications   Medication Sig Start Date End Date Taking? Authorizing Provider  amoxicillin-clavulanate (AUGMENTIN) 875-125 MG tablet Take 1 tablet by mouth 2 (two) times daily. Take all of this medication 02/27/19  Yes Mechele ClaudeStacks, Warren, MD  aspirin EC 81 MG tablet Take 81 mg by mouth daily.   Yes [provider]  atorvastatin (LIPITOR) 20 MG tablet TAKE ONE (1) TABLET EACH DAY Patient taking differently: Take 20 mg by mouth daily.  12/09/18  Yes Remus LofflerJones, Angel S, PA-C  Calcium Carbonate-Vitamin D (CALCIUM 600+D) 600-200 MG-UNIT TABS Take by mouth.   Yes [provider]  diclofenac sodium (VOLTAREN) 1 % GEL Apply 2 g topically 4 (four) times daily. 12/09/18  Yes Remus LofflerJones, Angel S, PA-C  escitalopram (LEXAPRO) 20 MG tablet Take 1 tablet (20 mg total) by mouth daily. 08/20/18  Yes Remus LofflerJones, Angel S, PA-C  fluticasone (FLONASE) 50 MCG/ACT nasal spray Place 1 spray into both nostrils daily.  03/01/19  Yes [provider]  ibuprofen (ADVIL) 800 MG tablet TAKE ONE TABLET 3 TIMES A DAY AS NEEDED. Patient taking differently: Take 800 mg by mouth 3 (three) times daily as needed for fever, headache or mild pain.  08/20/18  Yes Remus LofflerJones, Angel S, PA-C  levothyroxine (SYNTHROID) 100 MCG tablet TAKE ONE (1) TABLET EACH DAY Patient taking differently: Take 100 mcg by mouth daily before breakfast.  12/09/18  Yes Remus LofflerJones, Angel S, PA-C  Multiple Vitamins-Minerals (HAIR SKIN AND NAILS FORMULA) TABS Take 1 tablet by mouth daily.   Yes [provider]  ondansetron (ZOFRAN) 4 MG tablet Take 4 mg by mouth every 8 (eight) hours as needed for nausea or vomiting (DISSOLVE IN THE MOUTH).  03/01/19  Yes [provider]  predniSONE (DELTASONE) 10 MG tablet Take 5 daily for 2 days followed by 4,3,2 and 1  for 2 days each. 02/27/19  Yes Mechele ClaudeStacks, Warren, MD  UNABLE TO FIND Med Name: Greens Complete   Yes [provider]     Critical care time: 45 minutes

## 2019-03-05 NOTE — Progress Notes (Signed)
Called to patients room by nurse due to decrease in Sp02 levels, patient wearing oxygen set at 15 lpm salter along with 100% NRB with Sp02=55%. Patient respiratory rate 30-35 bpm patient in prone position and doctor at bed side. Arterial blood gas obtained and results given to doctor. Patient will be transferred to ICU for closer monitoring.

## 2019-03-05 NOTE — Progress Notes (Signed)
Pt ETT holister removed and protective barriers placed with cloth tape securing ETT at 24cm at the lip. Pt proned with RTx3 and RN x 4. Pt tolerated well, RT will continue to monitor.

## 2019-03-05 NOTE — Plan of Care (Signed)
New admit 12/09

## 2019-03-05 NOTE — Progress Notes (Addendum)
PROGRESS NOTE  Deborah Bond  IWL:[id number] DOB: 25-Feb-1961 DOA: 03/11/2019 PCP: Terald Sleeper, PA-C   Brief Narrative: Deborah Bond is a 58 y.o. female with a history of HTN, hypothyroidism, depression, hyperlipidemia, and morbid obesity with recently diagnosed covid-19, sent home on augmentin and prednisone only to return with progressive dyspnea and cough with fever on 12/8. She was hypoxic requiring 6L O2 initially with bilateral infiltrates on CXR. Remdesivir and steroids were given and the patient transferred to [facility] 12/9. She remained tachypneic and hypoxia had worsened on 12/9. Due to high risk of deterioration and refractory markers of inflammation, convalescent plasma and tocilizumab were administered. On 12/10 the patient grew profoundly hypoxic and was transferred to the ICU, consenting to intubation shortly thereafter.    Assessment & Plan: Principal Problem:   Acute respiratory failure with hypoxia (HCC) Active Problems:   DDD (degenerative disc disease), lumbar   Acquired hypothyroidism   Pure hypercholesterolemia   Depression, major, single episode, moderate (HCC)   Pneumonia due to COVID-19 virus   Hypokalemia   Hyponatremia   COVID-19 virus infection  Acute hypoxic respiratory failure due to covid-19 pneumonia: Worsening requiring intubation 12/10. CRP remains elevated 15.6 > 15.3 despite initiation of IV treatments, and is also in setting of having taken prednisone as outpatient. PCT neg at 0.14.  - Continue remdesivir 12/8 - 12/12 - Continue steroids 12/8 >> - s/p convalescent plasma 12/9 - s/p tocilizumab 12/9 - Give lovenox 0.49m/kg q12h VTE ppx given severity of illness.  - Ventilator management, sedation per PCCM. Will prone patient as able.  - D-dimer modestly elevated, PE not felt to be likely precipitant of worsening with very clear progression of bilateral infiltrates. Will give empiric lasix, stop IVF, and check BNP and echo. Empiric potassium to  be given per tube. - Pepcid SUP  History of tobacco use: Quit 8 years ago; smoked about 35 years about 1ppd. - No wheezing currently, may benefit from BDs.  Morbid obesity: BMI 46.  Hypothyroidism:  - Continue synthroid per tube  Depression:  - Continue SSRI per tube to avoid withdrawal  Hyperlipidemia:  - Continue statin  DVT prophylaxis: Lovenox intermediate dosing Code Status: Full Family Communication: Husband by phone by PCCM this AM, again by me this PM. Disposition Plan: Remain in ICU, guarded prognosis  Consultants:   PCCM  Procedures:   ETT 03/05/2019  Antimicrobials: 12/8 ceftriaxone x1 12/8 azithromycin x1 12/8 remdesivir - 12/12  Subjective: Intubated, sedated. Events of the morning noted.   Objective: Vitals:   03/05/19 0900 03/05/19 1000 03/05/19 1100 03/05/19 1149  BP: (!) 152/66 (!) 127/48 (!) 130/53 (!) 130/53  Pulse: 84 81 85 84  Resp: (!) 35 (!) 35 (!) 31 (!) 40  Temp:      TempSrc:      SpO2: 95% 96% 93% (!) 88%  Weight:      Height:        Intake/Output Summary (Last 24 hours) at 03/05/2019 1347 Last data filed at 03/05/2019 1300 Gross per 24 hour  Intake 1080.47 ml  Output 400 ml  Net 680.47 ml   Filed Weights   03/04/19 1200  Weight: 107 kg    Gen: 58y.o. female sedated Pulm: Ventilated breaths, crackles throughout CV: Regular rate and rhythm. No murmur, rub, or gallop. No JVD, no pedal edema. GI: Abdomen soft, non-tender, non-distended, with normoactive bowel sounds. No organomegaly or masses felt. Ext: Warm, no deformities Skin: No rashes, lesions or ulcers Neuro: Sedated  Psych: UTD  Data Reviewed: I have personally reviewed following labs and imaging studies  CBC: Recent Labs  Lab 03/02/2019 2117 03/04/19 0731 03/05/19 0305 03/05/19 0614 03/05/19 0937  WBC 6.8 6.8 7.9  --   --   NEUTROABS 4.7 4.9 6.9  --   --   HGB 13.2 13.3 13.4 13.6 13.9  HCT 41.7 41.8 43.9 40.0 41.0  MCV 93.5 94.1 95.6  --   --   PLT  247 237 259  --   --    Basic Metabolic Panel: Recent Labs  Lab 03/04/2019 2117 03/04/19 0731 03/05/19 0305 03/05/19 0543 03/05/19 0614 03/05/19 0937  NA 134* 139 142  --  142 143  K 3.4* 3.9 4.1  --  3.6 3.5  CL 102 106 107  --   --   --   CO2 20* 19* 24  --   --   --   GLUCOSE 141* 162* 156*  --   --   --   BUN 26* 20 22*  --   --   --   CREATININE 1.14* 0.96 0.78  --   --   --   CALCIUM 7.9* 7.6* 8.4*  --   --   --   MG  --   --   --  2.2  --   --   PHOS  --   --   --  2.7  --   --    GFR: Estimated Creatinine Clearance: 83.8 mL/min (by C-G formula based on SCr of 0.78 mg/dL). Liver Function Tests: Recent Labs  Lab 03/24/2019 2117 03/04/19 0731 03/05/19 0305  AST 35 45* 56*  ALT 18 18 28   ALKPHOS 69 61 75  BILITOT 0.4 0.5 0.5  PROT 6.6 5.8* 6.9  ALBUMIN 2.8* 2.7* 3.0*   No results for input(s): LIPASE, AMYLASE in the last 168 hours. No results for input(s): AMMONIA in the last 168 hours. Coagulation Profile: No results for input(s): INR, PROTIME in the last 168 hours. Cardiac Enzymes: No results for input(s): CKTOTAL, CKMB, CKMBINDEX, TROPONINI in the last 168 hours. BNP (last 3 results) No results for input(s): PROBNP in the last 8760 hours. HbA1C: No results for input(s): HGBA1C in the last 72 hours. CBG: Recent Labs  Lab 03/05/19 0813 03/05/19 1204  GLUCAP 153* 206*   Lipid Profile: Recent Labs    02/28/2019 2132  TRIG 160*   Thyroid Function Tests: No results for input(s): TSH, T4TOTAL, FREET4, T3FREE, THYROIDAB in the last 72 hours. Anemia Panel: Recent Labs    03/04/19 0731 03/05/19 0305  FERRITIN 296 340*   Urine analysis:    Component Value Date/Time   APPEARANCEUR Clear 12/05/2016 1222   GLUCOSEU Negative 12/05/2016 1222   BILIRUBINUR Negative 12/05/2016 1222   PROTEINUR Negative 12/05/2016 1222   NITRITE Negative 12/05/2016 1222   LEUKOCYTESUR Negative 12/05/2016 1222   Recent Results (from the past 240 hour(s))  Blood Culture  (routine x 2)     Status: None (Preliminary result)   Collection Time: 03/02/2019  9:15 PM   Specimen: BLOOD  Result Value Ref Range Status   Specimen Description BLOOD RIGHT ARM  Final   Special Requests   Final    BOTTLES DRAWN AEROBIC AND ANAEROBIC Blood Culture results may not be optimal due to an excessive volume of blood received in culture bottles   Culture   Final    NO GROWTH 1 DAY Performed at [facility], [place] [address],  [place], [place] [address]    Report Status PENDING  Incomplete  Blood Culture (routine x 2)     Status: None (Preliminary result)   Collection Time: 03/05/2019  9:36 PM   Specimen: BLOOD  Result Value Ref Range Status   Specimen Description BLOOD LEFT HAND  Final   Special Requests   Final    BOTTLES DRAWN AEROBIC ONLY Blood Culture results may not be optimal due to an excessive volume of blood received in culture bottles   Culture   Final    NO GROWTH 1 DAY Performed at [facility], [place] [address], [place], [place] [address]    Report Status PENDING  Incomplete      Radiology Studies: DG CHEST PORT 1 VIEW  Result Date: 03/05/2019 CLINICAL DATA:  Acute respiratory disease due to COVID-19 virus EXAM: PORTABLE CHEST - 1 VIEW COMPARISON:  02/26/2019 FINDINGS: Worsening diffuse interstitial and airspace opacities with progressive consolidation in the lung bases. Endotracheal tube has been placed, tip 2.4 cm above carina. Gastric tube extends to the decompressed stomach. No pneumothorax. No definite pleural effusion. Paired dorsal stimulator leads are partially visualized. Heart size difficult to assess due to adjacent opacities. Aortic Atherosclerosis (ICD10-170.0). Visualized bones unremarkable. IMPRESSION: 1. Worsening diffuse interstitial and airspace opacities with progressive consolidation in the lung bases. 2. Endotracheal tube tip 2.4 cm above the carina. Electronically Signed   By: Lucrezia Europe M.D.   On: 03/05/2019 08:55   DG Chest Portable 1  View  Result Date: 02/24/2019 CLINICAL DATA:  COVID (+), shortness of breath. Additional provided: Multiple episodes of emesis with fatigue, shortness of breath and poor appetite. EXAM: PORTABLE CHEST 1 VIEW COMPARISON:  Report from chest radiograph 11/01/1998 (images unavailable). FINDINGS: Shallow inspiration radiograph which extension weights the cardiac silhouette and limits evaluation of heart size. Aortic atherosclerosis Prominent bilateral airspace opacities within mid to lower lung predominance, likely reflecting pneumonia given provided history. There is also nonspecific diffuse bilateral interstitial prominence, which may reflect the same process. No sizable pleural effusion or pneumothorax. No acute bony abnormality. Spinal stimulator leads project over the lower thoracic spine. IMPRESSION: Prominent bilateral airspace opacities with a mid to lower lung predominance, likely reflecting pneumonia given provided history. Electronically Signed   By: Kellie Simmering DO   On: 03/10/2019 21:53    Scheduled Meds: . sodium chloride   Intravenous Once  . albuterol  2 puff Inhalation Q6H  . chlorhexidine gluconate (MEDLINE KIT)  15 mL Mouth Rinse BID  . Chlorhexidine Gluconate Cloth  6 each Topical Daily  . dexamethasone (DECADRON) injection  6 mg Intravenous Q24H  . diclofenac sodium  2 g Topical TID AC & HS  . enoxaparin (LOVENOX) injection  0.5 mg/kg Subcutaneous BID  . etomidate  20 mg Intravenous Once  . famotidine  20 mg Per Tube BID  . feeding supplement (PRO-STAT SUGAR FREE 64)  30 mL Per Tube BID  . feeding supplement (VITAL HIGH PROTEIN)  1,000 mL Per Tube Q24H  . fentaNYL (SUBLIMAZE) injection  100 mcg Intravenous Once  . fentaNYL (SUBLIMAZE) injection  50 mcg Intravenous Once  . furosemide  40 mg Intravenous Once  . [START ON 03/06/2019] levothyroxine  100 mcg Per Tube Q0600  . mouth rinse  15 mL Mouth Rinse 10 times per day  . potassium chloride  20 mEq Per Tube Once  . rocuronium   100 mg Intravenous Once  . sodium chloride flush  10-40 mL Intracatheter Q12H  . [START  ON 03/06/2019] vitamin C  500 mg Per Tube Daily  . [START ON 03/06/2019] zinc sulfate  220 mg Per Tube Daily   Continuous Infusions: . fentaNYL infusion INTRAVENOUS 100 mcg/hr (03/05/19 1300)  . midazolam 1.5 mg/hr (03/05/19 1326)  . remdesivir 100 mg in NS 100 mL Stopped (03/05/19 0945)     LOS: 2 days   Time spent: 35 minutes.  Patrecia Pour, MD [facility] [email or web address] 03/05/2019, 1:47 PM

## 2019-03-05 NOTE — Procedures (Signed)
Intubation Procedure Note Elanora Quin [id number] 04-09-1960  Procedure: Intubation Indications: Respiratory insufficiency  Procedure Details Consent: Risks of procedure as well as the alternatives and risks of each were explained to the (patient/caregiver).  Consent for procedure obtained. Time Out: Verified patient identification, verified procedure, site/side was marked, verified correct patient position, special equipment/implants available, medications/allergies/relevent history reviewed, required imaging and test results available.  Performed  Drugs 24m versed, fentanyl 553m IV, Etomidate 2049mV, Rocuronium 100m14m DL x 1 with MAC 3 blade Grade 1 view 7.5 ET tube passed through cords under direct visualization Placement confirmed with bilateral breath sounds, positive EtCO2 change and smoke in tube   Evaluation Hemodynamic Status: BP stable throughout; O2 sats: stable throughout Patient's Current Condition: stable Complications: No apparent complications Patient did tolerate procedure well. Chest X-ray ordered to verify placement.  CXR: pending.   DougSimonne Maffucci10/2020

## 2019-03-05 NOTE — Progress Notes (Signed)
  Echocardiogram 2D Echocardiogram has been performed.  Darlina Sicilian M 03/05/2019, 2:50 PM

## 2019-03-05 NOTE — Progress Notes (Signed)
Initial Nutrition Assessment  DOCUMENTATION CODES:   Morbid obesity  INTERVENTION:  Initiate:  Vital 1.5 @ 40 mL/hr via OG tube (960 mL daily) Prostat 37mL TID  The above TF regimen provides 1840 kcal, 125 grams protein and 733 mL free water  NUTRITION DIAGNOSIS:   Increased nutrient needs related to acute illness(COVID 19) as evidenced by estimated needs.  GOAL:   Patient will meet greater than or equal to 90% of their needs  MONITOR:   TF tolerance, I & O's, Labs, Weight trends, Vent status  REASON FOR ASSESSMENT:   Consult, Ventilator Enteral/tube feeding initiation and management  ASSESSMENT:   58 y.o. female with medical history significant of hypertension, hypothyroidism, depression, tenosynovitis and restless leg syndrome dx with COVID-19 three days ago presented to ED on 12/9 with progressing SOB, found to be hypoxic in the ED and placed on 6L of oxygen, quickly requiring NRB. Intubated on 12/10   Patient intubated and on ventilator support. MV: 10.2  Medications reviewed include: decadron; pepcid; synthroid; vitamin C; Zinc; fentanyl @ 50 mcg/hr; versed;   Labs reviewed:  CBGs: 153 Na 143; K+ 3.5; BUN 22 (H) MAP: 74 (69-100) UOP: 400 mL I&O: 2.2 L positive OG tube tip in stomach confirmed by xray.  Current TF regimen Provides 1160 kcal and 114 grams protein.  NUTRITION - FOCUSED PHYSICAL EXAM: Deferred; RD working remotely.  Diet Order:   Diet Order            Diet Heart Room service appropriate? Yes; Fluid consistency: Thin  Diet effective now              EDUCATION NEEDS:   Not appropriate for education at this time  Skin:  Skin Assessment: Reviewed RN Assessment  Last BM:  12/3(per chart)  Height:   Ht Readings from Last 1 Encounters:  03/05/19 5' (1.524 m)    Weight:   Wt Readings from Last 1 Encounters:  03/04/19 107 kg    Ideal Body Weight:  45.5 kg  BMI:  Body mass index is 46.07 kg/m.  Estimated Nutritional Needs:    Kcal:  8299-3716  Protein:  120-140 grams  Fluid:  >/= 2 L  Meda Klinefelter, Dietetic Intern

## 2019-03-05 NOTE — Progress Notes (Signed)
ABG results given to Dr. Lake Bells. Verbal order received to place pt on 8cc/kg and to prone pt when staff available. RT notified charge RN. RT will continue to monitor.

## 2019-03-05 NOTE — Progress Notes (Signed)
RT NOTE:  RT assisted with turning patients head to left. ETT secured @ 24cm.

## 2019-03-06 ENCOUNTER — Inpatient Hospital Stay (HOSPITAL_COMMUNITY): Payer: BC Managed Care – PPO

## 2019-03-06 ENCOUNTER — Inpatient Hospital Stay: Payer: Self-pay

## 2019-03-06 DIAGNOSIS — M5136 Other intervertebral disc degeneration, lumbar region: Secondary | ICD-10-CM

## 2019-03-06 DIAGNOSIS — F321 Major depressive disorder, single episode, moderate: Secondary | ICD-10-CM

## 2019-03-06 LAB — GLUCOSE, CAPILLARY
Glucose-Capillary: 191 mg/dL — ABNORMAL HIGH (ref 70–99)
Glucose-Capillary: 202 mg/dL — ABNORMAL HIGH (ref 70–99)
Glucose-Capillary: 171 mg/dL — ABNORMAL HIGH (ref 70–99)
Glucose-Capillary: 194 mg/dL — ABNORMAL HIGH (ref 70–99)
Glucose-Capillary: 183 mg/dL — ABNORMAL HIGH (ref 70–99)

## 2019-03-06 LAB — COMPREHENSIVE METABOLIC PANEL
ALT: 25 U/L (ref 0–44)
AST: 36 U/L (ref 15–41)
Albumin: 2.9 g/dL — ABNORMAL LOW (ref 3.5–5.0)
Alkaline Phosphatase: 81 U/L (ref 38–126)
Anion gap: 14 (ref 5–15)
BUN: 53 mg/dL — ABNORMAL HIGH (ref 6–20)
CO2: 23 mmol/L (ref 22–32)
Calcium: 8.6 mg/dL — ABNORMAL LOW (ref 8.9–10.3)
Chloride: 108 mmol/L (ref 98–111)
Creatinine, Ser: 1.38 mg/dL — ABNORMAL HIGH (ref 0.44–1.00)
GFR calc Af Amer: 48 mL/min — ABNORMAL LOW (ref >60)
GFR calc non Af Amer: 42 mL/min — ABNORMAL LOW (ref >60)
Glucose, Bld: 198 mg/dL — ABNORMAL HIGH (ref 70–99)
Potassium: 4.6 mmol/L (ref 3.5–5.1)
Sodium: 145 mmol/L (ref 135–145)
Total Bilirubin: 0.4 mg/dL (ref 0.3–1.2)
Total Protein: 6.5 g/dL (ref 6.5–8.1)

## 2019-03-06 LAB — MAGNESIUM
Magnesium: 2.2 mg/dL (ref 1.7–2.4)
Magnesium: 2.3 mg/dL (ref 1.7–2.4)

## 2019-03-06 LAB — PHOSPHORUS
Phosphorus: 3.3 mg/dL (ref 2.5–4.6)
Phosphorus: 3.4 mg/dL (ref 2.5–4.6)

## 2019-03-06 LAB — FERRITIN: Ferritin: 248 ng/mL (ref 11–307)

## 2019-03-06 LAB — PROTIME-INR
INR: 1 (ref 0.8–1.2)
Prothrombin Time: 13.4 seconds (ref 11.4–15.2)

## 2019-03-06 LAB — D-DIMER, QUANTITATIVE: D-Dimer, Quant: 1.48 ug/mL-FEU — ABNORMAL HIGH (ref 0.00–0.50)

## 2019-03-06 LAB — C-REACTIVE PROTEIN: CRP: 8 mg/dL — ABNORMAL HIGH (ref <1.0)

## 2019-03-06 MED ORDER — INSULIN ASPART 100 UNIT/ML ~~LOC~~ SOLN
0.0000 [IU] | SUBCUTANEOUS | Status: DC
  Administered 2019-03-06 (×3): 3 [IU] via SUBCUTANEOUS
  Administered 2019-03-07: 5 [IU] via SUBCUTANEOUS
  Administered 2019-03-07 (×3): 3 [IU] via SUBCUTANEOUS
  Administered 2019-03-07 (×2): 5 [IU] via SUBCUTANEOUS
  Administered 2019-03-08: 16:00:00 3 [IU] via SUBCUTANEOUS
  Administered 2019-03-08: 8 [IU] via SUBCUTANEOUS
  Administered 2019-03-08 (×2): 3 [IU] via SUBCUTANEOUS
  Administered 2019-03-08 (×2): 5 [IU] via SUBCUTANEOUS
  Administered 2019-03-09: 13:00:00 8 [IU] via SUBCUTANEOUS
  Administered 2019-03-09: 05:00:00 5 [IU] via SUBCUTANEOUS
  Administered 2019-03-09: 09:00:00 8 [IU] via SUBCUTANEOUS
  Administered 2019-03-09: 01:00:00 5 [IU] via SUBCUTANEOUS

## 2019-03-06 MED ORDER — ROCURONIUM BROMIDE 10 MG/ML (PF) SYRINGE
100.0000 mg | PREFILLED_SYRINGE | Freq: Once | INTRAVENOUS | Status: AC
  Administered 2019-03-06: 100 mg via INTRAVENOUS
  Filled 2019-03-06: qty 10

## 2019-03-06 MED ORDER — POLYETHYLENE GLYCOL 3350 17 G PO PACK
17.0000 g | PACK | Freq: Every day | ORAL | Status: DC
  Administered 2019-03-06 – 2019-03-07 (×2): 17 g
  Filled 2019-03-06 (×2): qty 1

## 2019-03-06 MED ORDER — FREE WATER
200.0000 mL | Freq: Four times a day (QID) | Status: DC
  Administered 2019-03-06 – 2019-03-10 (×17): 200 mL

## 2019-03-06 MED ORDER — ENOXAPARIN SODIUM 100 MG/ML ~~LOC~~ SOLN
100.0000 mg | Freq: Two times a day (BID) | SUBCUTANEOUS | Status: DC
  Administered 2019-03-06 – 2019-03-13 (×13): 100 mg via SUBCUTANEOUS
  Filled 2019-03-06 (×17): qty 1

## 2019-03-06 MED ORDER — CLONAZEPAM 0.5 MG PO TBDP
0.5000 mg | ORAL_TABLET | Freq: Two times a day (BID) | ORAL | Status: DC
  Administered 2019-03-06 – 2019-03-09 (×7): 0.5 mg
  Filled 2019-03-06 (×7): qty 1

## 2019-03-06 MED ORDER — OXYCODONE HCL 5 MG PO TABS
5.0000 mg | ORAL_TABLET | Freq: Four times a day (QID) | ORAL | Status: DC
  Administered 2019-03-06 – 2019-03-10 (×17): 5 mg
  Filled 2019-03-06 (×18): qty 1

## 2019-03-06 NOTE — Progress Notes (Signed)
ANTICOAGULATION CONSULT NOTE - Initial Consult  Pharmacy Consult for Lovenox Indication: Covid-PACT trial  Patient Measurements: Height: 5' (152.4 cm) Weight: 221 lb 1.9 oz (100.3 kg) IBW/kg (Calculated) : 45.5  Vital Signs: Temp: 98.8 F (37.1 C) (12/11 1107) Temp Source: Oral (12/11 0815) BP: 140/73 (12/11 0810) Pulse Rate: 71 (12/11 0810)  Labs: Recent Labs    03/13/2019 2117 03/04/19 2841 03/04/19 0731 03/05/19 0305 03/05/19 0614 03/05/19 0937 03/06/19 0500  HGB 13.2  --  13.3 13.4 13.6 13.9  --   HCT 41.7  --  41.8 43.9 40.0 41.0  --   PLT 247  --  237 259  --   --   --   CREATININE 1.14*  --  0.96 0.78  --   --  1.38*  TROPONINIHS  --  5  --   --   --   --   --      Medical History: Past Medical History:  Diagnosis Date  . Depression   . Hyperlipidemia   . Thyroid disease      Assessment:  58 yo female intubated in ICU with Covid-19 pneumonia.She has been enrolled in the Covid-PACT trial and was randomized to Lovenox full treatment dose. H/h plts wnl. She received a dose of Lovenox prophylaxis this morning around 0900.   Goal of Therapy:  Anti-Xa level 0.6-1 units/ml 4hrs after LMWH dose given Monitor platelets by anticoagulation protocol: Yes   Plan:  -Lovenox 100 mg Erda q12h -Monitor s/sx bleeding -F/u anticoagulation choice when patient transfers out of ICU   Harvel Quale 03/06/2019,2:10 PM

## 2019-03-06 NOTE — Progress Notes (Signed)
NAME:  Deborah Bond, MRN:  [id number], DOB:  [birth year], LOS: 3 ADMISSION DATE:  03/02/2019, CONSULTATION DATE:  12/10 REFERRING MD:  Jarvis Newcomer, CHIEF COMPLAINT:  Dyspnea  Brief History   58 y/o female admitted on 12/8 with dyspnea, cough and hypoxemia due to COVID 19.  Moved to [facility] on 12/9.  Early in the AM on 12/10 she was moved to the ICU for worsening hypoxemia.    Past Medical History  Hyperlipidemia Thyroid disease Depression  Significant Hospital Events   12/9 admission to [facility] 12/10 ICU transfer, intubation  Consults:  PCCM  Procedures:  12/10 PCCM>   Significant Diagnostic Tests:    Micro Data:  12/8 POC SARS COV 2 > positive 12/8 blood >   Antimicrobials/COVID Rx:  12/8 ceftriaxone x1 12/8 azithromycin x1 12/8 remdesivir >  12/8 decadron >  12/9 tocilizumab x1  12/9 CCP x1  Interim history/subjective:   12/11 plan to move to supine today Plateau 27, driving pressure 11  Objective   Blood pressure 132/72, pulse 76, temperature (!) 97.1 F (36.2 C), temperature source Axillary, resp. rate (!) 35, height 5' (1.524 m), weight 107 kg, SpO2 96 %.    Vent Mode: PRVC FiO2 (%):  [80 %-100 %] 80 % Set Rate:  [35 bmp] 35 bmp Vt Set:  [310 mL-360 mL] 360 mL PEEP:  [16 cmH20-18 cmH20] 16 cmH20 Plateau Pressure:  [27 cmH20-37 cmH20] 33 cmH20   Intake/Output Summary (Last 24 hours) at 03/06/2019 0726 Last data filed at 03/06/2019 0700 Gross per 24 hour  Intake 1668.17 ml  Output 300 ml  Net 1368.17 ml   Filed Weights   03/04/19 1200  Weight: 107 kg    Examination:  General:  In bed on vent, prone HENT: NCAT ETT in place PULM: CTA B, vent supported breathing CV: prone GI: prone MSK: normal bulk and tone Neuro: sedated on vent    12/10 CXR > bilateral airspace disease, ETT in place  Resolved Hospital Problem list     Assessment & Plan:  ARDS due to COVID 19 pneumonia>  ddx includes acute pulmonary edema Decadron 10 days remdesivir 5  days Continue mechanical ventilation per ARDS protocol Target TVol 6-8cc/kgIBW Target Plateau Pressure < 30cm H20 Target driving pressure less than 15 cm of water Target PaO2 55-65: titrate PEEP/FiO2 per protocol As long as PaO2 to FiO2 ratio is less than 1:150 position in prone position for 16 hours a day Check CVP daily if CVL in place Target CVP less than 4, diurese as necessary Ventilator associated pneumonia prevention protocol 12/11 hold lasix with worsening renal function, check supine ABG today  Hyperglycemia SSI, change to q4h  Hypothyroid Synthroid  Depression Continue SSRI  Need for sedation for mechanical ventilation PAD protocol RASS target -2 to -3 Continue fentanyl/versed per protocol Add oxycodone, clonazepam   Best practice:  Diet: tube feeding, add free water Pain/Anxiety/Delirium protocol (if indicated): yes, as above VAP protocol (if indicated): yes DVT prophylaxis: lovenox GI prophylaxis: famotidine Glucose control: per TRH Mobility: bed rest Code Status: full Family Communication: will update family today Disposition: remain in ICU  Labs   CBC: Recent Labs  Lab 03/23/2019 2117 03/04/19 0731 03/05/19 0305 03/05/19 0614 03/05/19 0937  WBC 6.8 6.8 7.9  --   --   NEUTROABS 4.7 4.9 6.9  --   --   HGB 13.2 13.3 13.4 13.6 13.9  HCT 41.7 41.8 43.9 40.0 41.0  MCV 93.5 94.1 95.6  --   --  PLT 247 237 259  --   --     Basic Metabolic Panel: Recent Labs  Lab Mar 05, 2019 2117 03/04/19 0731 03/05/19 0305 03/05/19 0543 03/05/19 0614 03/05/19 0937 03/05/19 1659 03/06/19 0500  NA 134* 139 142  --  142 143  --  145  K 3.4* 3.9 4.1  --  3.6 3.5  --  4.6  CL 102 106 107  --   --   --   --  108  CO2 20* 19* 24  --   --   --   --  23  GLUCOSE 141* 162* 156*  --   --   --   --  198*  BUN 26* 20 22*  --   --   --   --  53*  CREATININE 1.14* 0.96 0.78  --   --   --   --  1.38*  CALCIUM 7.9* 7.6* 8.4*  --   --   --   --  8.6*  MG  --   --   --  2.2   --   --  2.0 2.2  PHOS  --   --   --  2.7  --   --  2.6 3.3   GFR: Estimated Creatinine Clearance: 48.6 mL/min (A) (by C-G formula based on SCr of 1.38 mg/dL (H)). Recent Labs  Lab 03-05-19 2117 05-Mar-2019 2132 03/04/19 0036 03/04/19 0731 03/05/19 0305  PROCALCITON 0.14  --   --   --   --   WBC 6.8  --   --  6.8 7.9  LATICACIDVEN  --  1.4 1.3  --   --     Liver Function Tests: Recent Labs  Lab 03/05/2019 2117 03/04/19 0731 03/05/19 0305 03/06/19 0500  AST 35 45* 56* 36  ALT 18 18 28 25   ALKPHOS 69 61 75 81  BILITOT 0.4 0.5 0.5 0.4  PROT 6.6 5.8* 6.9 6.5  ALBUMIN 2.8* 2.7* 3.0* 2.9*   No results for input(s): LIPASE, AMYLASE in the last 168 hours. No results for input(s): AMMONIA in the last 168 hours.  ABG    Component Value Date/Time   PHART 7.118 (LL) 03/05/2019 0937   PCO2ART 74.8 (HH) 03/05/2019 0937   PO2ART 82.0 (L) 03/05/2019 0937   HCO3 24.2 03/05/2019 0937   TCO2 26 03/05/2019 0937   ACIDBASEDEF 7.0 (H) 03/05/2019 0937   O2SAT 91.0 03/05/2019 0937     Coagulation Profile: No results for input(s): INR, PROTIME in the last 168 hours.  Cardiac Enzymes: No results for input(s): CKTOTAL, CKMB, CKMBINDEX, TROPONINI in the last 168 hours.  HbA1C: HB A1C (BAYER DCA - WAIVED)  Date/Time Value Ref Range Status  12/09/2018 08:41 AM 5.7 <7.0 % Final    Comment:                                          Diabetic Adult            <7.0                                       Healthy Adult        4.3 - 5.7                                                           (  DCCT/NGSP) American Diabetes Association's Summary of Glycemic Recommendations for Adults with Diabetes: Hemoglobin A1c <7.0%. More stringent glycemic goals (A1c <6.0%) may further reduce complications at the cost of increased risk of hypoglycemia.     CBG: Recent Labs  Lab 03/05/19 0813 03/05/19 1204 03/05/19 1558 03/05/19 2004 03/05/19 2218  GLUCAP 153* 206* 194* 163* 172*    Review of  Systems:   Could not obtain due to severe respiratory failure  Past Medical History  She,  has a past medical history of Depression, Hyperlipidemia, and Thyroid disease.   Surgical History    Past Surgical History:  Procedure Laterality Date  . ABDOMINAL HYSTERECTOMY    . BACK SURGERY    . THYROID SURGERY       Social History   reports that she has quit smoking. She has never used smokeless tobacco. She reports that she does not drink alcohol or use drugs.   Family History   Her family history includes Cancer in her father and mother.   Allergies No Known Allergies   Home Medications  Prior to Admission medications   Medication Sig Start Date End Date Taking? Authorizing Provider  amoxicillin-clavulanate (AUGMENTIN) 875-125 MG tablet Take 1 tablet by mouth 2 (two) times daily. Take all of this medication 02/27/19  Yes Mechele ClaudeStacks, Warren, MD  aspirin EC 81 MG tablet Take 81 mg by mouth daily.   Yes [provider]  atorvastatin (LIPITOR) 20 MG tablet TAKE ONE (1) TABLET EACH DAY Patient taking differently: Take 20 mg by mouth daily.  12/09/18  Yes Remus LofflerJones, Angel S, PA-C  Calcium Carbonate-Vitamin D (CALCIUM 600+D) 600-200 MG-UNIT TABS Take by mouth.   Yes [provider]  diclofenac sodium (VOLTAREN) 1 % GEL Apply 2 g topically 4 (four) times daily. 12/09/18  Yes Remus LofflerJones, Angel S, PA-C  escitalopram (LEXAPRO) 20 MG tablet Take 1 tablet (20 mg total) by mouth daily. 08/20/18  Yes Remus LofflerJones, Angel S, PA-C  fluticasone (FLONASE) 50 MCG/ACT nasal spray Place 1 spray into both nostrils daily.  03/01/19  Yes [provider]  ibuprofen (ADVIL) 800 MG tablet TAKE ONE TABLET 3 TIMES A DAY AS NEEDED. Patient taking differently: Take 800 mg by mouth 3 (three) times daily as needed for fever, headache or mild pain.  08/20/18  Yes Remus LofflerJones, Angel S, PA-C  levothyroxine (SYNTHROID) 100 MCG tablet TAKE ONE (1) TABLET EACH DAY Patient taking differently: Take 100 mcg by mouth daily before  breakfast.  12/09/18  Yes Remus LofflerJones, Angel S, PA-C  Multiple Vitamins-Minerals (HAIR SKIN AND NAILS FORMULA) TABS Take 1 tablet by mouth daily.   Yes [provider]  ondansetron (ZOFRAN) 4 MG tablet Take 4 mg by mouth every 8 (eight) hours as needed for nausea or vomiting (DISSOLVE IN THE MOUTH).  03/01/19  Yes [provider]  predniSONE (DELTASONE) 10 MG tablet Take 5 daily for 2 days followed by 4,3,2 and 1 for 2 days each. 02/27/19  Yes Mechele ClaudeStacks, Warren, MD  UNABLE TO FIND Med Name: Greens Complete   Yes [provider]     Critical care time: 30 minutes       Heber CarolinaBrent Joel Cowin, MD [facility] Pager: [phone] Cell: [phone] If no response, call [phone]

## 2019-03-06 NOTE — Progress Notes (Signed)
Turned patient's head and rotated arms while prone. Tolerated well. 

## 2019-03-06 NOTE — Progress Notes (Signed)
Peripherally Inserted Central Catheter/Midline Placement  The IV Nurse has discussed with the patient and/or persons authorized to consent for the patient, the purpose of this procedure and the potential benefits and risks involved with this procedure.  The benefits include less needle sticks, lab draws from the catheter, and the patient may be discharged home with the catheter. Risks include, but not limited to, infection, bleeding, blood clot (thrombus formation), and puncture of an artery; nerve damage and irregular heartbeat and possibility to perform a PICC exchange if needed/ordered by physician.  Alternatives to this procedure were also discussed.  Bard Power PICC patient education guide, fact sheet on infection prevention and patient information card has been provided to patient /or left at bedside.  Consent signed by husband  PICC/Midline Placement Documentation  PICC Triple Lumen 03/06/19 PICC Right Cephalic 44 cm 0 cm (Active)  Indication for Insertion or Continuance of Line Vasoactive infusions 03/06/19 1559  Exposed Catheter (cm) 4 cm 03/06/19 1559  Site Assessment Clean;Dry;Intact 03/06/19 1559  Lumen #1 Status Flushed;Saline locked;Blood return noted 03/06/19 1559  Lumen #2 Status Flushed;Saline locked;Blood return noted 03/06/19 1559  Lumen #3 Status Flushed;Saline locked;Blood return noted 03/06/19 1559  Dressing Type Transparent 03/06/19 1559  Dressing Status Clean;Dry;Antimicrobial disc in place;Intact 03/06/19 1559  Dressing Change Due 03/13/19 03/06/19 1559       Gordan Payment 03/06/2019, 4:00 PM

## 2019-03-06 NOTE — Research (Signed)
[place] PACT Informed Consent   Subject Name: Deborah Bond  Subject met inclusion and exclusion criteria.  The informed consent form, study requirements and expectations were reviewed with the subject and questions and concerns were addressed prior to the signing of the consent form.  The subject verbalized understanding of the trail requirements.  The subject agreed to participate in the [place] PACT trial and signed the informed consent.  The informed consent was obtained prior to performance of any protocol-specific procedures for the subject.  A copy of the signed informed consent was given to the subject and a copy was placed in the subject's medical record.   Philemon Kingdom D 03/06/2019, 1223 pm

## 2019-03-06 NOTE — Progress Notes (Signed)
Patient unproned and cloth tape and protective dressings removed for skin evaluation.  Small blood blister on bottom lip noted.  Cheeks, chin, forehead all look fine.  Resecured ET with [place].

## 2019-03-06 NOTE — Progress Notes (Signed)
RT NOTE:  RT assisted RN x 2 with turning patients head to left. ETT secured throughout @ 24cm lip.

## 2019-03-06 NOTE — Progress Notes (Signed)
RT NOTE:  RT assisted RN x 2 with turning patients head to right. ETT secured throughout @ 24cm lip 

## 2019-03-06 NOTE — Research (Signed)
YES NO INCLUSION  [x] [] Age ?18 years (female or female)  [x] [] Acute infection with severe acute respiratory syndrome coronarvirus 2 (SARS-CoV2)  [x] [] Currently admitted to an ICU 1. Where ICU admission occurred < 72 hours prior to randomization.   YES NO EXCLUSION  [] [x] Ongoing (>48hours) or planned full-dose (therapeutic) anticoagulation for any indication  [] [x] Ongoing or planned treatment with dual antiplatelet therapy  [] [x] Contraindication to antithrombotic therapy or high risk of bleeding due to conditions including, but not limited to, any of the following:  [] [x] a) History of intracranial hemorrhage, known CNS tumor        or CNS vascular abnormality  [] [x]  b) Active or recent major bleeding within the past 30 days with untreated source  [] [x] c) Platelet count <70,000 or known functional platelet disorder  [] [x]  d) Fibrinogen <200 mg/dL  [] [] e) International normalized ratio (INR) >1.9  [] [x] History of heparin-induced thrombocytopenia  [] [x]  Ischemic stroke within the past 2 weeks  [] [x] Pregnancy  [] [x]  Study center employees or their family members  [] [x]  Any condition which in the investigator's assessment might increase the risk to the patient or decrease the chance of obtaining satisfactory data to achieve the objectives of the study  [] [x]  Subjects for whom further care is being forgone at the decision of the subject, family, and/or treating team ("comfort measures only")    

## 2019-03-06 NOTE — Progress Notes (Signed)
RT NOTE:  RT assisted RN x 2 with turning patients head to right. ETT secured throughout @ 24cm lip

## 2019-03-06 NOTE — Progress Notes (Signed)
Spoke with RN re PICC order.  Notified to be placed today.  Will attempt to obtain consent from spouse.

## 2019-03-06 NOTE — Progress Notes (Signed)
Spoke with pt's husband and answered all questions.

## 2019-03-06 NOTE — Progress Notes (Signed)
RT NOTE:   Pt ETT holister removed and protective barriers placed with cloth tape securing ETT at 24cm at the lip. Pt proned with RTx2 and RN x 4. Pt tolerated well, RT will continue to monitor.

## 2019-03-07 ENCOUNTER — Inpatient Hospital Stay (HOSPITAL_COMMUNITY): Payer: BC Managed Care – PPO

## 2019-03-07 DIAGNOSIS — Z9911 Dependence on respirator [ventilator] status: Secondary | ICD-10-CM

## 2019-03-07 DIAGNOSIS — N179 Acute kidney failure, unspecified: Secondary | ICD-10-CM

## 2019-03-07 LAB — CBC WITH DIFFERENTIAL/PLATELET
Abs Immature Granulocytes: 0.23 10*3/uL — ABNORMAL HIGH (ref 0.00–0.07)
Basophils Absolute: 0 10*3/uL (ref 0.0–0.1)
Basophils Relative: 0 %
Eosinophils Absolute: 0 10*3/uL (ref 0.0–0.5)
Eosinophils Relative: 0 %
HCT: 35.4 % — ABNORMAL LOW (ref 36.0–46.0)
Hemoglobin: 10.6 g/dL — ABNORMAL LOW (ref 12.0–15.0)
Immature Granulocytes: 2 %
Lymphocytes Relative: 7 %
Lymphs Abs: 0.7 10*3/uL (ref 0.7–4.0)
MCH: 29.6 pg (ref 26.0–34.0)
MCHC: 29.9 g/dL — ABNORMAL LOW (ref 30.0–36.0)
MCV: 98.9 fL (ref 80.0–100.0)
Monocytes Absolute: 0.5 10*3/uL (ref 0.1–1.0)
Monocytes Relative: 5 %
Neutro Abs: 8.6 10*3/uL — ABNORMAL HIGH (ref 1.7–7.7)
Neutrophils Relative %: 86 %
Platelets: 269 10*3/uL (ref 150–400)
RBC: 3.58 MIL/uL — ABNORMAL LOW (ref 3.87–5.11)
RDW: 13.7 % (ref 11.5–15.5)
WBC: 10.1 10*3/uL (ref 4.0–10.5)
nRBC: 0 % (ref 0.0–0.2)

## 2019-03-07 LAB — POCT I-STAT 7, (LYTES, BLD GAS, ICA,H+H)
Acid-base deficit: 4 mmol/L — ABNORMAL HIGH (ref 0.0–2.0)
Acid-base deficit: 1 mmol/L (ref 0.0–2.0)
Bicarbonate: 24.2 mmol/L (ref 20.0–28.0)
Bicarbonate: 26.4 mmol/L (ref 20.0–28.0)
Calcium, Ion: 1.26 mmol/L (ref 1.15–1.40)
Calcium, Ion: 1.26 mmol/L (ref 1.15–1.40)
HCT: 37 % (ref 36.0–46.0)
HCT: 32 % — ABNORMAL LOW (ref 36.0–46.0)
Hemoglobin: 12.6 g/dL (ref 12.0–15.0)
Hemoglobin: 10.9 g/dL — ABNORMAL LOW (ref 12.0–15.0)
O2 Saturation: 83 %
O2 Saturation: 93 %
Patient temperature: 98.5
Potassium: 4.3 mmol/L (ref 3.5–5.1)
Potassium: 4.3 mmol/L (ref 3.5–5.1)
Sodium: 145 mmol/L (ref 135–145)
Sodium: 144 mmol/L (ref 135–145)
TCO2: 26 mmol/L (ref 22–32)
TCO2: 28 mmol/L (ref 22–32)
pCO2 arterial: 54.4 mmHg — ABNORMAL HIGH (ref 32.0–48.0)
pCO2 arterial: 55.1 mmHg — ABNORMAL HIGH (ref 32.0–48.0)
pH, Arterial: 7.256 — ABNORMAL LOW (ref 7.350–7.450)
pH, Arterial: 7.288 — ABNORMAL LOW (ref 7.350–7.450)
pO2, Arterial: 55 mmHg — ABNORMAL LOW (ref 83.0–108.0)
pO2, Arterial: 76 mmHg — ABNORMAL LOW (ref 83.0–108.0)

## 2019-03-07 LAB — COMPREHENSIVE METABOLIC PANEL
ALT: 45 U/L — ABNORMAL HIGH (ref 0–44)
AST: 41 U/L (ref 15–41)
Albumin: 2.6 g/dL — ABNORMAL LOW (ref 3.5–5.0)
Alkaline Phosphatase: 74 U/L (ref 38–126)
Anion gap: 8 (ref 5–15)
BUN: 69 mg/dL — ABNORMAL HIGH (ref 6–20)
CO2: 27 mmol/L (ref 22–32)
Calcium: 8.3 mg/dL — ABNORMAL LOW (ref 8.9–10.3)
Chloride: 110 mmol/L (ref 98–111)
Creatinine, Ser: 1.05 mg/dL — ABNORMAL HIGH (ref 0.44–1.00)
GFR calc Af Amer: >60 mL/min (ref >60)
GFR calc non Af Amer: 58 mL/min — ABNORMAL LOW (ref >60)
Glucose, Bld: 240 mg/dL — ABNORMAL HIGH (ref 70–99)
Potassium: 4.6 mmol/L (ref 3.5–5.1)
Sodium: 145 mmol/L (ref 135–145)
Total Bilirubin: 0.4 mg/dL (ref 0.3–1.2)
Total Protein: 5.7 g/dL — ABNORMAL LOW (ref 6.5–8.1)

## 2019-03-07 LAB — MRSA PCR SCREENING: MRSA by PCR: NEGATIVE

## 2019-03-07 LAB — GLUCOSE, CAPILLARY
Glucose-Capillary: 179 mg/dL — ABNORMAL HIGH (ref 70–99)
Glucose-Capillary: 190 mg/dL — ABNORMAL HIGH (ref 70–99)
Glucose-Capillary: 214 mg/dL — ABNORMAL HIGH (ref 70–99)
Glucose-Capillary: 233 mg/dL — ABNORMAL HIGH (ref 70–99)
Glucose-Capillary: 234 mg/dL — ABNORMAL HIGH (ref 70–99)
Glucose-Capillary: 196 mg/dL — ABNORMAL HIGH (ref 70–99)

## 2019-03-07 LAB — FIBRINOGEN: Fibrinogen: 101 mg/dL — ABNORMAL LOW (ref 210–475)

## 2019-03-07 LAB — FERRITIN: Ferritin: 87 ng/mL (ref 11–307)

## 2019-03-07 LAB — C-REACTIVE PROTEIN: CRP: 1.6 mg/dL — ABNORMAL HIGH (ref <1.0)

## 2019-03-07 LAB — PROTIME-INR
INR: 2.1 — ABNORMAL HIGH (ref 0.8–1.2)
Prothrombin Time: 23.7 seconds — ABNORMAL HIGH (ref 11.4–15.2)

## 2019-03-07 LAB — D-DIMER, QUANTITATIVE: D-Dimer, Quant: 0.5 ug/mL-FEU (ref 0.00–0.50)

## 2019-03-07 MED ORDER — POLYETHYLENE GLYCOL 3350 17 G PO PACK
17.0000 g | PACK | Freq: Two times a day (BID) | ORAL | Status: DC
  Administered 2019-03-07 – 2019-03-14 (×11): 17 g
  Filled 2019-03-07 (×13): qty 1

## 2019-03-07 NOTE — Progress Notes (Signed)
RT NOTE:  RT assisted with turning patients head to the left. ETT secured at 24cm lip.

## 2019-03-07 NOTE — Progress Notes (Signed)
Patient's head repositioned and arms rotated.  Patient tolerated well.  No skin breakdown noted at this time.    

## 2019-03-07 NOTE — Progress Notes (Signed)
Spouse Nubia Ziesmer was called at 2150 on 12/11, but did not answer. A message was left updating on the patients condition and current therapies. Augusto Garbe, RN

## 2019-03-07 NOTE — Progress Notes (Signed)
Pt un-proned and cloth tape removed and evaluation of skin done with no breakdown noted.Secured ETT with tube holder. No complications noted.

## 2019-03-07 NOTE — Progress Notes (Signed)
RT NOTE:  RT assisted with turning patients head to the right. ETT secured at 24cm/lip.

## 2019-03-07 NOTE — Progress Notes (Addendum)
The patient was placed in the prone position at 2150 on 12/11. The patient was stable for the procedure. Her ventilator settings remain the same at 80% FiO2 and 16 of PEEP. She has remained synchronous with the vent, is on 234mcg of Fentanyl; and 8 of Versed. She has made adequate urine output, vital signs have remained stable. A CVP line was started. Augusto Garbe, RN 03/07/2019 6:17 AM

## 2019-03-07 NOTE — Progress Notes (Signed)
NAME:  Deborah Bond, MRN:  [id number], DOB:  July 24, 1960, LOS: 4 ADMISSION DATE:  02/25/2019, CONSULTATION DATE:  12/10 REFERRING MD:  Jarvis Newcomer, CHIEF COMPLAINT:  Dyspnea  Brief History   58 y/o female admitted on 12/8 with dyspnea, cough and hypoxemia due to COVID 19.  Moved to [facility] on 12/9.  Early in the AM on 12/10 she was moved to the ICU for worsening hypoxemia.    Past Medical History  Hyperlipidemia Thyroid disease Depression  Significant Hospital Events   12/9 admission to [facility] 12/10 ICU transfer, intubation  Consults:  PCCM  Procedures:  12/10 PCCM>   Significant Diagnostic Tests:    Micro Data:  12/8 POC SARS COV 2 > positive 12/8 blood >   Antimicrobials/COVID Rx:  12/8 ceftriaxone x1 12/8 azithromycin x1 12/8 remdesivir >  12/8 decadron >  12/9 tocilizumab x1  12/9 CCP x1  Interim history/subjective:   Remains prone.  Remains on maximal mechanical support.  Objective   Blood pressure (!) 124/57, pulse (!) 58, temperature 97.7 F (36.5 C), temperature source Oral, resp. rate (!) 35, height 5' (1.524 m), weight 100.3 kg, SpO2 100 %. CVP:  [11 mmHg-36 mmHg] 16 mmHg  Vent Mode: PRVC FiO2 (%):  [80 %] 80 % Set Rate:  [35 bmp] 35 bmp Vt Set:  [360 mL] 360 mL PEEP:  [16 cmH20] 16 cmH20 Plateau Pressure:  [27 cmH20-29 cmH20] 28 cmH20   Intake/Output Summary (Last 24 hours) at 03/07/2019 0726 Last data filed at 03/07/2019 0600 Gross per 24 hour  Intake 1660.33 ml  Output 1950 ml  Net -289.67 ml   Filed Weights   03/04/19 1200 03/06/19 0500  Weight: 107 kg 100.3 kg    Examination:  General: Intubated on mechanical ventilation, critically ill prone positioning HENT: NCAT, endotracheal tube in place head to the side PULM: Bilateral ventilated breath sounds, prone positioning CV: Sinus on telemetry, however unable to auscultate cardiac due to prone positioning GI: Edema in the flanks appears soft, prone abdominal positioning MSK: Some edema in  the upper and lower extremities Neuro: Sedated on mechanical ventilation   03/07/2019: Chest x-ray diffuse bilateral infiltrates. The patient's images have been independently reviewed by me.    Resolved Hospital Problem list     Assessment & Plan:   ARDS due to COVID 19 pneumonia Complete course of Decadron x10 days Complete course of remdesivir x5 days Continue mechanical ventilation per ARDS protocol Target TVol 6-8cc/kgIBW Target Plateau Pressure < 30cm H20 Target driving pressure less than 15 cm of water Target PaO2 55-65: titrate PEEP/FiO2 per protocol As long as PaO2 to FiO2 ratio is less than 1:150 position in prone position for 16 hours a day Check CVP daily if CVL in place Target CVP less than 4, diurese as necessary Ventilator associated pneumonia prevention protocol Holding additional diuresis Patient had to have labs redrawn.  Kidney function was worsening yesterday.  Hyperglycemia SSI  Hypothyroid Levothyroxine  Depression PTA SSRI  Need for sedation for mechanical ventilation PAD protocol, target RASS -3 Continue fentanyl plus Versed  Best practice:  Diet: tube feeding, add free water Pain/Anxiety/Delirium protocol (if indicated): yes, as above VAP protocol (if indicated): yes DVT prophylaxis: lovenox GI prophylaxis: famotidine Glucose control: per TRH Mobility: bed rest Code Status: full Family Communication: We will call and update family. I spoke with the patients husband Caryn Bee.  Disposition: remain in ICU  Labs   CBC: Recent Labs  Lab 03/11/2019 2117 03/04/19 0731 03/05/19 0305 03/05/19 5621  03/05/19 0937 03/07/19 0445  WBC 6.8 6.8 7.9  --   --  10.1  NEUTROABS 4.7 4.9 6.9  --   --  PENDING  HGB 13.2 13.3 13.4 13.6 13.9 10.6*  HCT 41.7 41.8 43.9 40.0 41.0 35.4*  MCV 93.5 94.1 95.6  --   --  98.9  PLT 247 237 259  --   --  269    Basic Metabolic Panel: Recent Labs  Lab 2019-01-28 2117 03/04/19 0731 03/05/19 0305 03/05/19 0543  03/05/19 0614 03/05/19 0937 03/05/19 1659 03/06/19 0500 03/06/19 1640  NA 134* 139 142  --  142 143  --  145  --   K 3.4* 3.9 4.1  --  3.6 3.5  --  4.6  --   CL 102 106 107  --   --   --   --  108  --   CO2 20* 19* 24  --   --   --   --  23  --   GLUCOSE 141* 162* 156*  --   --   --   --  198*  --   BUN 26* 20 22*  --   --   --   --  53*  --   CREATININE 1.14* 0.96 0.78  --   --   --   --  1.38*  --   CALCIUM 7.9* 7.6* 8.4*  --   --   --   --  8.6*  --   MG  --   --   --  2.2  --   --  2.0 2.2 2.3  PHOS  --   --   --  2.7  --   --  2.6 3.3 3.4   GFR: Estimated Creatinine Clearance: 46.7 mL/min (A) (by C-G formula based on SCr of 1.38 mg/dL (H)). Recent Labs  Lab 2019-01-28 2117 2019-01-28 2132 03/04/19 0036 03/04/19 0731 03/05/19 0305 03/07/19 0445  PROCALCITON 0.14  --   --   --   --   --   WBC 6.8  --   --  6.8 7.9 10.1  LATICACIDVEN  --  1.4 1.3  --   --   --     Liver Function Tests: Recent Labs  Lab 2019-01-28 2117 03/04/19 0731 03/05/19 0305 03/06/19 0500  AST 35 45* 56* 36  ALT 18 18 28 25   ALKPHOS 69 61 75 81  BILITOT 0.4 0.5 0.5 0.4  PROT 6.6 5.8* 6.9 6.5  ALBUMIN 2.8* 2.7* 3.0* 2.9*   No results for input(s): LIPASE, AMYLASE in the last 168 hours. No results for input(s): AMMONIA in the last 168 hours.  ABG    Component Value Date/Time   PHART 7.118 (LL) 03/05/2019 0937   PCO2ART 74.8 (HH) 03/05/2019 0937   PO2ART 82.0 (L) 03/05/2019 0937   HCO3 24.2 03/05/2019 0937   TCO2 26 03/05/2019 0937   ACIDBASEDEF 7.0 (H) 03/05/2019 0937   O2SAT 91.0 03/05/2019 0937     Coagulation Profile: Recent Labs  Lab 03/06/19 1640  INR 1.0    Cardiac Enzymes: No results for input(s): CKTOTAL, CKMB, CKMBINDEX, TROPONINI in the last 168 hours.  HbA1C: HB A1C (BAYER DCA - WAIVED)  Date/Time Value Ref Range Status  12/09/2018 08:41 AM 5.7 <7.0 % Final    Comment:  Diabetic Adult            <7.0                                        Healthy Adult        4.3 - 5.7                                                           (DCCT/NGSP) American Diabetes Association's Summary of Glycemic Recommendations for Adults with Diabetes: Hemoglobin A1c <7.0%. More stringent glycemic goals (A1c <6.0%) may further reduce complications at the cost of increased risk of hypoglycemia.     CBG: Recent Labs  Lab 03/06/19 1157 03/06/19 1720 03/06/19 2000 03/07/19 0139 03/07/19 0453  GLUCAP 171* 194* 183* 179* 190*    Review of Systems:   Could not obtain due to severe respiratory failure  Past Medical History  She,  has a past medical history of Depression, Hyperlipidemia, and Thyroid disease.   Surgical History    Past Surgical History:  Procedure Laterality Date  . ABDOMINAL HYSTERECTOMY    . BACK SURGERY    . THYROID SURGERY       Social History   reports that she has quit smoking. She has never used smokeless tobacco. She reports that she does not drink alcohol or use drugs.   Family History   Her family history includes Cancer in her father and mother.   Allergies No Known Allergies   Home Medications  Prior to Admission medications   Medication Sig Start Date End Date Taking? Authorizing Provider  amoxicillin-clavulanate (AUGMENTIN) 875-125 MG tablet Take 1 tablet by mouth 2 (two) times daily. Take all of this medication 02/27/19  Yes Mechele Claude, MD  aspirin EC 81 MG tablet Take 81 mg by mouth daily.   Yes [provider]  atorvastatin (LIPITOR) 20 MG tablet TAKE ONE (1) TABLET EACH DAY Patient taking differently: Take 20 mg by mouth daily.  12/09/18  Yes Remus Loffler, PA-C  Calcium Carbonate-Vitamin D (CALCIUM 600+D) 600-200 MG-UNIT TABS Take by mouth.   Yes [provider]  diclofenac sodium (VOLTAREN) 1 % GEL Apply 2 g topically 4 (four) times daily. 12/09/18  Yes Remus Loffler, PA-C  escitalopram (LEXAPRO) 20 MG tablet Take 1 tablet (20 mg total) by mouth daily. 08/20/18   Yes Remus Loffler, PA-C  fluticasone (FLONASE) 50 MCG/ACT nasal spray Place 1 spray into both nostrils daily.  03/01/19  Yes [provider]  ibuprofen (ADVIL) 800 MG tablet TAKE ONE TABLET 3 TIMES A DAY AS NEEDED. Patient taking differently: Take 800 mg by mouth 3 (three) times daily as needed for fever, headache or mild pain.  08/20/18  Yes Remus Loffler, PA-C  levothyroxine (SYNTHROID) 100 MCG tablet TAKE ONE (1) TABLET EACH DAY Patient taking differently: Take 100 mcg by mouth daily before breakfast.  12/09/18  Yes Remus Loffler, PA-C  Multiple Vitamins-Minerals (HAIR SKIN AND NAILS FORMULA) TABS Take 1 tablet by mouth daily.   Yes [provider]  ondansetron (ZOFRAN) 4 MG tablet Take 4 mg by mouth every 8 (eight) hours as needed for nausea or vomiting (DISSOLVE IN THE MOUTH).  03/01/19  Yes [provider]  predniSONE (DELTASONE) 10 MG tablet Take 5 daily for 2 days followed by 4,3,2 and 1 for 2 days each. 02/27/19  Yes Claretta Fraise, MD  UNABLE TO FIND Med Name: Greens Complete   Yes [provider]     This patient is critically ill with multiple organ system failure; which, requires frequent high complexity decision making, assessment, support, evaluation, and titration of therapies. This was completed through the application of advanced monitoring technologies and extensive interpretation of multiple databases. During this encounter critical care time was devoted to patient care services described in this note for 33 minutes.  Garner Nash, DO [facility] 03/07/2019 7:26 AM

## 2019-03-07 NOTE — Progress Notes (Signed)
Patients belongings inventoried at bedside: 1 shirt, 1 pair of pants, 1 sweater, 1 pair of glasses, one cell phone, one pair of underwear. Deborah Bond

## 2019-03-07 NOTE — Progress Notes (Signed)
Called pt's husband to update him on pt's current status. Answered questions about the ventilator, lab results, and medications. Provided emotional support. He is thankful for our care. Lailah Marcelli, Rande Brunt, RN

## 2019-03-08 ENCOUNTER — Inpatient Hospital Stay (HOSPITAL_COMMUNITY): Payer: BC Managed Care – PPO

## 2019-03-08 LAB — COMPREHENSIVE METABOLIC PANEL
ALT: 72 U/L — ABNORMAL HIGH (ref 0–44)
AST: 57 U/L — ABNORMAL HIGH (ref 15–41)
Albumin: 2.7 g/dL — ABNORMAL LOW (ref 3.5–5.0)
Alkaline Phosphatase: 72 U/L (ref 38–126)
Anion gap: 7 (ref 5–15)
BUN: 67 mg/dL — ABNORMAL HIGH (ref 6–20)
CO2: 28 mmol/L (ref 22–32)
Calcium: 8.3 mg/dL — ABNORMAL LOW (ref 8.9–10.3)
Chloride: 111 mmol/L (ref 98–111)
Creatinine, Ser: 0.92 mg/dL (ref 0.44–1.00)
GFR calc Af Amer: >60 mL/min (ref >60)
GFR calc non Af Amer: >60 mL/min (ref >60)
Glucose, Bld: 208 mg/dL — ABNORMAL HIGH (ref 70–99)
Potassium: 4.8 mmol/L (ref 3.5–5.1)
Sodium: 146 mmol/L — ABNORMAL HIGH (ref 135–145)
Total Bilirubin: 0.3 mg/dL (ref 0.3–1.2)
Total Protein: 5.7 g/dL — ABNORMAL LOW (ref 6.5–8.1)

## 2019-03-08 LAB — CBC
HCT: 36.6 % (ref 36.0–46.0)
Hemoglobin: 10.8 g/dL — ABNORMAL LOW (ref 12.0–15.0)
MCH: 29.1 pg (ref 26.0–34.0)
MCHC: 29.5 g/dL — ABNORMAL LOW (ref 30.0–36.0)
MCV: 98.7 fL (ref 80.0–100.0)
Platelets: 298 10*3/uL (ref 150–400)
RBC: 3.71 MIL/uL — ABNORMAL LOW (ref 3.87–5.11)
RDW: 13.7 % (ref 11.5–15.5)
WBC: 8.2 10*3/uL (ref 4.0–10.5)
nRBC: 0 % (ref 0.0–0.2)

## 2019-03-08 LAB — POCT I-STAT 7, (LYTES, BLD GAS, ICA,H+H)
Acid-Base Excess: 5 mmol/L — ABNORMAL HIGH (ref 0.0–2.0)
Bicarbonate: 30.8 mmol/L — ABNORMAL HIGH (ref 20.0–28.0)
Calcium, Ion: 1.22 mmol/L (ref 1.15–1.40)
HCT: 35 % — ABNORMAL LOW (ref 36.0–46.0)
Hemoglobin: 11.9 g/dL — ABNORMAL LOW (ref 12.0–15.0)
O2 Saturation: 91 %
Patient temperature: 98.6
Potassium: 3.8 mmol/L (ref 3.5–5.1)
Sodium: 144 mmol/L (ref 135–145)
TCO2: 32 mmol/L (ref 22–32)
pCO2 arterial: 51.5 mmHg — ABNORMAL HIGH (ref 32.0–48.0)
pH, Arterial: 7.385 (ref 7.350–7.450)
pO2, Arterial: 64 mmHg — ABNORMAL LOW (ref 83.0–108.0)

## 2019-03-08 LAB — MAGNESIUM: Magnesium: 2.5 mg/dL — ABNORMAL HIGH (ref 1.7–2.4)

## 2019-03-08 LAB — PROTIME-INR
INR: 1.1 (ref 0.8–1.2)
Prothrombin Time: 13.8 seconds (ref 11.4–15.2)

## 2019-03-08 LAB — GLUCOSE, CAPILLARY
Glucose-Capillary: 156 mg/dL — ABNORMAL HIGH (ref 70–99)
Glucose-Capillary: 196 mg/dL — ABNORMAL HIGH (ref 70–99)
Glucose-Capillary: 198 mg/dL — ABNORMAL HIGH (ref 70–99)
Glucose-Capillary: 215 mg/dL — ABNORMAL HIGH (ref 70–99)
Glucose-Capillary: 232 mg/dL — ABNORMAL HIGH (ref 70–99)
Glucose-Capillary: 261 mg/dL — ABNORMAL HIGH (ref 70–99)

## 2019-03-08 LAB — C-REACTIVE PROTEIN: CRP: 1.9 mg/dL — ABNORMAL HIGH (ref <1.0)

## 2019-03-08 LAB — D-DIMER, QUANTITATIVE: D-Dimer, Quant: 2.23 ug/mL-FEU — ABNORMAL HIGH (ref 0.00–0.50)

## 2019-03-08 LAB — FERRITIN: Ferritin: 138 ng/mL (ref 11–307)

## 2019-03-08 MED ORDER — METOLAZONE 5 MG PO TABS
2.5000 mg | ORAL_TABLET | Freq: Once | ORAL | Status: AC
  Administered 2019-03-08: 09:00:00 2.5 mg via ORAL
  Filled 2019-03-08: qty 1

## 2019-03-08 MED ORDER — FUROSEMIDE 10 MG/ML IJ SOLN
40.0000 mg | Freq: Four times a day (QID) | INTRAMUSCULAR | Status: AC
  Administered 2019-03-08 (×3): 40 mg via INTRAVENOUS
  Filled 2019-03-08 (×3): qty 4

## 2019-03-08 NOTE — Progress Notes (Signed)
Patient's head turned to the right by RT x 2 without complications.

## 2019-03-08 NOTE — Progress Notes (Signed)
Patient placed in supine position.  ETT re-secured with commercial tube holder.  No breakdown noted.  Patient tolerated well with no complications.  

## 2019-03-08 NOTE — Progress Notes (Signed)
NAME:  Deborah Bond, MRN:  [id number], DOB:  [birth year], LOS: 5 ADMISSION DATE:  03/11/2019, CONSULTATION DATE:  12/10 REFERRING MD:  Jarvis NewcomerGrunz, CHIEF COMPLAINT:  Dyspnea  Brief History   58 y/o female admitted on 12/8 with dyspnea, cough and hypoxemia due to COVID 19.  [facility]  Early in the AM on 12/10 she was moved to the ICU for worsening hypoxemia.    Past Medical History  Hyperlipidemia Thyroid disease Depression  Significant Hospital Events   [facility], intubation  Consults:  PCCM  Procedures:  12/10 PCCM>   Significant Diagnostic Tests:    Micro Data:  12/8 POC SARS COV 2 > positive 12/8 blood >   Antimicrobials/COVID Rx:  12/8 ceftriaxone x1 12/8 azithromycin x1 12/8 remdesivir >  12/8 decadron >  12/9 tocilizumab x1  12/9 CCP x1  Interim history/subjective:   No significant issues overnight.  Afebrile.  Successfully on prone yesterday evening and reprobed this morning.  Objective   Blood pressure 133/69, pulse 62, temperature 97.8 F (36.6 C), temperature source Axillary, resp. rate (!) 35, height 5' (1.524 m), weight 100.3 kg, SpO2 100 %. CVP:  [6 mmHg-36 mmHg] 36 mmHg  Vent Mode: PRVC FiO2 (%):  [60 %-80 %] 60 % Set Rate:  [35 bmp] 35 bmp Vt Set:  [360 mL] 360 mL PEEP:  [14 cmH20-16 cmH20] 14 cmH20 Plateau Pressure:  [2 cmH20-40 cmH20] 35 cmH20   Intake/Output Summary (Last 24 hours) at 03/08/2019 0759 Last data filed at 03/08/2019 [id number] Gross per 24 hour  Intake 2824.95 ml  Output 1555 ml  Net 1269.95 ml   Filed Weights   03/04/19 1200 03/06/19 0500  Weight: 107 kg 100.3 kg    Examination:  General: Intubated on mechanical ventilation, critically ill prone positioning HENT: NCAT, endotracheal tube in place head to the side PULM: Bilateral ventilated breath sounds auscultated posteriorly CV: Sinus on telemetry GI: Prone MSK: Some edema in the upper and lower extremities Neuro: Sedated on  mechanical ventilation  03/08/2019: Chest x-ray, persistent bilateral infiltrates relatively unchanged from yesterday. The patient's images have been independently reviewed by me.    Resolved Hospital Problem list     Assessment & Plan:   ARDS due to COVID 19 pneumonia Overall oxygenation is improving.  Able to wean some today.  Continue mechanical ventilation per ARDS protocol Target TVol 6-8cc/kgIBW Target Plateau Pressure < 30cm H20 Target driving pressure less than 15 cm of water Target PaO2 55-65: titrate PEEP/FiO2 per protocol As long as PaO2 to FiO2 ratio is less than 1:150 position in prone position for 16 hours a day Check CVP daily if CVL in place Target CVP less than 4, diurese as necessary Ventilator associated pneumonia prevention protocol Completed course of Decadron Completed course of remdesivir Does have positive cumulative fluid balance. Kidney function improved therefore will restart low-dose diuresis. Lasix 40 mg IV every 6 x 3 doses  Hyperglycemia SSI  Hypothyroid Levothyroxine  Depression PTA SSRI  Need for sedation for mechanical ventilation PAD protocol in place, target RASS -3 Continue fentanyl and Versed.  Best practice:   Diet: tube feeding, free water Pain/Anxiety/Delirium protocol (if indicated): yes, as above VAP protocol (if indicated): yes DVT prophylaxis: lovenox GI prophylaxis: famotidine Glucose control: ssi Mobility: bed rest Code Status: full Family Communication: I called and spoke with her husband this morning. He was appreciative of the call.  Disposition: remain in ICU  Labs   CBC:  Recent Labs  Lab 03/01/2019 2117 03/04/19 0731 03/05/19 0305 03/05/19 0937 03/06/19 1453 03/07/19 0445 03/07/19 1702 03/08/19 0633  WBC 6.8 6.8 7.9  --   --  10.1  --  8.2  NEUTROABS 4.7 4.9 6.9  --   --  8.6*  --   --   HGB 13.2 13.3 13.4 13.9 12.6 10.6* 10.9* 10.8*  HCT 41.7 41.8 43.9 41.0 37.0 35.4* 32.0* 36.6  MCV 93.5 94.1 95.6   --   --  98.9  --  98.7  PLT 247 237 259  --   --  269  --  259    Basic Metabolic Panel: Recent Labs  Lab 03/14/2019 2117 03/04/19 0731 03/05/19 0305 03/05/19 0543 03/05/19 0937 03/05/19 1659 03/06/19 0500 03/06/19 1453 03/06/19 1640 03/07/19 1000 03/07/19 1702  NA 134* 139 142  --  143  --  145 145  --  145 144  K 3.4* 3.9 4.1  --  3.5  --  4.6 4.3  --  4.6 4.3  CL 102 106 107  --   --   --  108  --   --  110  --   CO2 20* 19* 24  --   --   --  23  --   --  27  --   GLUCOSE 141* 162* 156*  --   --   --  198*  --   --  240*  --   BUN 26* 20 22*  --   --   --  53*  --   --  69*  --   CREATININE 1.14* 0.96 0.78  --   --   --  1.38*  --   --  1.05*  --   CALCIUM 7.9* 7.6* 8.4*  --   --   --  8.6*  --   --  8.3*  --   MG  --   --   --  2.2  --  2.0 2.2  --  2.3  --   --   PHOS  --   --   --  2.7  --  2.6 3.3  --  3.4  --   --    GFR: Estimated Creatinine Clearance: 61.4 mL/min (A) (by C-G formula based on SCr of 1.05 mg/dL (H)). Recent Labs  Lab 03/01/2019 2117 03/02/2019 2132 03/04/19 0036 03/04/19 0731 03/05/19 0305 03/07/19 0445 03/08/19 0633  PROCALCITON 0.14  --   --   --   --   --   --   WBC 6.8  --   --  6.8 7.9 10.1 8.2  LATICACIDVEN  --  1.4 1.3  --   --   --   --     Liver Function Tests: Recent Labs  Lab 03/19/2019 2117 03/04/19 0731 03/05/19 0305 03/06/19 0500 03/07/19 1000  AST 35 45* 56* 36 41  ALT 18 18 28 25  45*  ALKPHOS 69 61 75 81 74  BILITOT 0.4 0.5 0.5 0.4 0.4  PROT 6.6 5.8* 6.9 6.5 5.7*  ALBUMIN 2.8* 2.7* 3.0* 2.9* 2.6*   No results for input(s): LIPASE, AMYLASE in the last 168 hours. No results for input(s): AMMONIA in the last 168 hours.  ABG    Component Value Date/Time   PHART 7.288 (L) 03/07/2019 1702   PCO2ART 55.1 (H) 03/07/2019 1702   PO2ART 76.0 (L) 03/07/2019 1702   HCO3 26.4 03/07/2019 1702   TCO2 28 03/07/2019 1702   ACIDBASEDEF  1.0 03/07/2019 1702   O2SAT 93.0 03/07/2019 1702     Coagulation Profile: Recent Labs  Lab  03/06/19 1640 03/07/19 0445  INR 1.0 2.1*    Cardiac Enzymes: No results for input(s): CKTOTAL, CKMB, CKMBINDEX, TROPONINI in the last 168 hours.  HbA1C: HB A1C (BAYER DCA - WAIVED)  Date/Time Value Ref Range Status  12/09/2018 08:41 AM 5.7 <7.0 % Final    Comment:                                          Diabetic Adult            <7.0                                       Healthy Adult        4.3 - 5.7                                                           (DCCT/NGSP) American Diabetes Association's Summary of Glycemic Recommendations for Adults with Diabetes: Hemoglobin A1c <7.0%. More stringent glycemic goals (A1c <6.0%) may further reduce complications at the cost of increased risk of hypoglycemia.     CBG: Recent Labs  Lab 03/07/19 1216 03/07/19 1534 03/07/19 1958 03/08/19 0032 03/08/19 0415  GLUCAP 233* 234* 196* 215* 198*    This patient is critically ill with multiple organ system failure; which, requires frequent high complexity decision making, assessment, support, evaluation, and titration of therapies. This was completed through the application of advanced monitoring technologies and extensive interpretation of multiple databases. During this encounter critical care time was devoted to patient care services described in this note for 32 minutes.  Josephine Igo, DO [facility] 03/08/2019 7:59 AM

## 2019-03-08 NOTE — Progress Notes (Signed)
Attempted to call pt's husband for daily update. Unable to reach him at this time.

## 2019-03-08 NOTE — Progress Notes (Signed)
Pt's head turned at this time w/o event. 

## 2019-03-08 NOTE — Progress Notes (Signed)
Pt proned at this time after taping tube with cloth tape.  No complications to note.

## 2019-03-09 ENCOUNTER — Inpatient Hospital Stay (HOSPITAL_COMMUNITY): Payer: BC Managed Care – PPO

## 2019-03-09 LAB — POCT I-STAT 7, (LYTES, BLD GAS, ICA,H+H)
Acid-Base Excess: 11 mmol/L — ABNORMAL HIGH (ref 0.0–2.0)
Bicarbonate: 37.6 mmol/L — ABNORMAL HIGH (ref 20.0–28.0)
Calcium, Ion: 1.17 mmol/L (ref 1.15–1.40)
HCT: 38 % (ref 36.0–46.0)
Hemoglobin: 12.9 g/dL (ref 12.0–15.0)
O2 Saturation: 98 %
Patient temperature: 99.6
Potassium: 3.6 mmol/L (ref 3.5–5.1)
Sodium: 141 mmol/L (ref 135–145)
TCO2: 39 mmol/L — ABNORMAL HIGH (ref 22–32)
pCO2 arterial: 57.9 mmHg — ABNORMAL HIGH (ref 32.0–48.0)
pH, Arterial: 7.422 (ref 7.350–7.450)
pO2, Arterial: 112 mmHg — ABNORMAL HIGH (ref 83.0–108.0)

## 2019-03-09 LAB — CULTURE, BLOOD (ROUTINE X 2)
Culture: NO GROWTH
Culture: NO GROWTH

## 2019-03-09 LAB — GLUCOSE, CAPILLARY
Glucose-Capillary: 227 mg/dL — ABNORMAL HIGH (ref 70–99)
Glucose-Capillary: 237 mg/dL — ABNORMAL HIGH (ref 70–99)
Glucose-Capillary: 239 mg/dL — ABNORMAL HIGH (ref 70–99)
Glucose-Capillary: 251 mg/dL — ABNORMAL HIGH (ref 70–99)
Glucose-Capillary: 257 mg/dL — ABNORMAL HIGH (ref 70–99)
Glucose-Capillary: 261 mg/dL — ABNORMAL HIGH (ref 70–99)
Glucose-Capillary: 263 mg/dL — ABNORMAL HIGH (ref 70–99)

## 2019-03-09 LAB — COMPREHENSIVE METABOLIC PANEL
ALT: 61 U/L — ABNORMAL HIGH (ref 0–44)
AST: 35 U/L (ref 15–41)
Albumin: 2.6 g/dL — ABNORMAL LOW (ref 3.5–5.0)
Alkaline Phosphatase: 59 U/L (ref 38–126)
Anion gap: 10 (ref 5–15)
BUN: 72 mg/dL — ABNORMAL HIGH (ref 6–20)
CO2: 30 mmol/L (ref 22–32)
Calcium: 7.9 mg/dL — ABNORMAL LOW (ref 8.9–10.3)
Chloride: 105 mmol/L (ref 98–111)
Creatinine, Ser: 0.92 mg/dL (ref 0.44–1.00)
GFR calc Af Amer: >60 mL/min (ref >60)
GFR calc non Af Amer: >60 mL/min (ref >60)
Glucose, Bld: 243 mg/dL — ABNORMAL HIGH (ref 70–99)
Potassium: 3.8 mmol/L (ref 3.5–5.1)
Sodium: 145 mmol/L (ref 135–145)
Total Bilirubin: 0.7 mg/dL (ref 0.3–1.2)
Total Protein: 5.4 g/dL — ABNORMAL LOW (ref 6.5–8.1)

## 2019-03-09 LAB — PROTIME-INR
INR: 1.1 (ref 0.8–1.2)
Prothrombin Time: 14.1 seconds (ref 11.4–15.2)

## 2019-03-09 LAB — C-REACTIVE PROTEIN: CRP: 1.1 mg/dL — ABNORMAL HIGH (ref <1.0)

## 2019-03-09 LAB — D-DIMER, QUANTITATIVE: D-Dimer, Quant: 1.85 ug/mL-FEU — ABNORMAL HIGH (ref 0.00–0.50)

## 2019-03-09 LAB — FERRITIN: Ferritin: 125 ng/mL (ref 11–307)

## 2019-03-09 LAB — MAGNESIUM: Magnesium: 2.4 mg/dL (ref 1.7–2.4)

## 2019-03-09 MED ORDER — INSULIN ASPART 100 UNIT/ML ~~LOC~~ SOLN
0.0000 [IU] | SUBCUTANEOUS | Status: DC
  Administered 2019-03-09: 20:00:00 7 [IU] via SUBCUTANEOUS
  Administered 2019-03-09: 17:00:00 11 [IU] via SUBCUTANEOUS
  Administered 2019-03-09 – 2019-03-10 (×3): 7 [IU] via SUBCUTANEOUS
  Administered 2019-03-10: 11:00:00 11 [IU] via SUBCUTANEOUS
  Administered 2019-03-10 – 2019-03-11 (×3): 4 [IU] via SUBCUTANEOUS
  Administered 2019-03-11 (×2): 7 [IU] via SUBCUTANEOUS
  Administered 2019-03-11: 13:00:00 4 [IU] via SUBCUTANEOUS
  Administered 2019-03-11: 05:00:00 11 [IU] via SUBCUTANEOUS
  Administered 2019-03-11: 01:00:00 7 [IU] via SUBCUTANEOUS
  Administered 2019-03-11: 17:00:00 4 [IU] via SUBCUTANEOUS
  Administered 2019-03-12: 7 [IU] via SUBCUTANEOUS
  Administered 2019-03-12: 20:00:00 4 [IU] via SUBCUTANEOUS
  Administered 2019-03-12: 7 [IU] via SUBCUTANEOUS
  Administered 2019-03-12: 3 [IU] via SUBCUTANEOUS
  Administered 2019-03-12: 11 [IU] via SUBCUTANEOUS
  Administered 2019-03-12: 4 [IU] via SUBCUTANEOUS
  Administered 2019-03-13 (×2): 7 [IU] via SUBCUTANEOUS
  Administered 2019-03-13: 4 [IU] via SUBCUTANEOUS
  Administered 2019-03-13: 7 [IU] via SUBCUTANEOUS
  Administered 2019-03-13: 4 [IU] via SUBCUTANEOUS
  Administered 2019-03-14: 7 [IU] via SUBCUTANEOUS
  Administered 2019-03-14 (×3): 4 [IU] via SUBCUTANEOUS
  Administered 2019-03-14 (×3): 7 [IU] via SUBCUTANEOUS
  Administered 2019-03-15 (×5): 4 [IU] via SUBCUTANEOUS
  Administered 2019-03-16: 05:00:00 3 [IU] via SUBCUTANEOUS
  Administered 2019-03-16 (×4): 4 [IU] via SUBCUTANEOUS
  Administered 2019-03-17 (×2): 3 [IU] via SUBCUTANEOUS
  Administered 2019-03-18: 4 [IU] via SUBCUTANEOUS
  Administered 2019-03-18: 7 [IU] via SUBCUTANEOUS
  Administered 2019-03-18: 3 [IU] via SUBCUTANEOUS
  Administered 2019-03-18: 11 [IU] via SUBCUTANEOUS
  Administered 2019-03-18: 02:00:00 3 [IU] via SUBCUTANEOUS
  Administered 2019-03-19: 4 [IU] via SUBCUTANEOUS
  Administered 2019-03-19 – 2019-03-20 (×4): 3 [IU] via SUBCUTANEOUS

## 2019-03-09 MED ORDER — INSULIN ASPART 100 UNIT/ML ~~LOC~~ SOLN
3.0000 [IU] | SUBCUTANEOUS | Status: DC
  Administered 2019-03-09 – 2019-03-15 (×34): 3 [IU] via SUBCUTANEOUS

## 2019-03-09 MED ORDER — CLONAZEPAM 1 MG PO TABS
1.0000 mg | ORAL_TABLET | Freq: Two times a day (BID) | ORAL | Status: DC
  Administered 2019-03-09 – 2019-03-10 (×2): 1 mg
  Filled 2019-03-09 (×2): qty 1

## 2019-03-09 MED ORDER — FUROSEMIDE 10 MG/ML IJ SOLN
40.0000 mg | Freq: Four times a day (QID) | INTRAMUSCULAR | Status: AC
  Administered 2019-03-09 (×3): 40 mg via INTRAVENOUS
  Filled 2019-03-09 (×3): qty 4

## 2019-03-09 MED ORDER — CLONAZEPAM 0.5 MG PO TBDP
0.5000 mg | ORAL_TABLET | Freq: Once | ORAL | Status: AC
  Administered 2019-03-09: 15:00:00 0.5 mg
  Filled 2019-03-09: qty 1

## 2019-03-09 NOTE — Progress Notes (Signed)
Pt's head turned at this time w/o event. 

## 2019-03-09 NOTE — Progress Notes (Signed)
LB PCCM  No need to prone as P:F ratio improved  Roselie Awkward, MD [place] [facility] Pager: [phone] Cell: [phone] If no response, call [phone]

## 2019-03-09 NOTE — Progress Notes (Signed)
Pt head turned at 0935.  No complications.  Skin integrity is good.

## 2019-03-09 NOTE — Progress Notes (Signed)
ETT tube secured with commercial tube holder.  24 at the lip.

## 2019-03-09 NOTE — Progress Notes (Signed)
Pt proned at this time after replacing commercial tube holder with cloth tape.  No event to record.

## 2019-03-09 NOTE — Progress Notes (Signed)
NAME:  Deborah Bond, MRN:  [id number], DOB:  17-Feb-1961, LOS: 6 ADMISSION DATE:  03-20-19, CONSULTATION DATE:  12/10 REFERRING MD:  Bonner Puna, CHIEF COMPLAINT:  Dyspnea  Brief History   58 y/o female admitted on 12/8 with dyspnea, cough and hypoxemia due to COVID 19.  Moved to [facility] on 12/9.  Early in the AM on 12/10 she was moved to the ICU for worsening hypoxemia.    Past Medical History  Hyperlipidemia Thyroid disease Depression  Significant Hospital Events   12/9 admission to [facility] 12/10 ICU transfer, intubation  Consults:  PCCM  Procedures:  12/10 PCCM>   Significant Diagnostic Tests:    Micro Data:  12/8 POC SARS COV 2 > positive 12/8 blood >   Antimicrobials/COVID Rx:  12/8 ceftriaxone x1 12/8 azithromycin x1 12/8 remdesivir > 12/12 12/8 decadron >  12/9 tocilizumab x1  12/9 CCP x1  Interim history/subjective:   diursed yesterday Remains prone 60%, PEEP 14 TVol Plateau 24, driving pressure 10  Objective   Blood pressure (!) 146/58, pulse (!) 51, temperature 98.9 F (37.2 C), temperature source Axillary, resp. rate (!) 35, height 5' (1.524 m), weight 101.3 kg, SpO2 100 %.    Vent Mode: PRVC FiO2 (%):  [50 %-80 %] 60 % Set Rate:  [35 bmp] 35 bmp Vt Set:  [36 mL-360 mL] 36 mL PEEP:  [12 cmH20-14 cmH20] 14 cmH20 Plateau Pressure:  [27 cmH20-33 cmH20] 27 cmH20   Intake/Output Summary (Last 24 hours) at 03/09/2019 2353 Last data filed at 03/09/2019 0900 Gross per 24 hour  Intake 1706.54 ml  Output 5170 ml  Net -3463.46 ml   Filed Weights   03/04/19 1200 03/06/19 0500 03/09/19 0500  Weight: 107 kg 100.3 kg 101.3 kg    Examination:  General:  In bed on vent, prone HENT: NCAT ETT in place PULM: CTA B, vent supported breathing CV: prone GI: prone MSK: normal bulk and tone Neuro: sedated on vent  12/14 CXR images personally reviewed: severe bilateral airspace dsiease, ett in place  Resolved Hospital Problem list     Assessment & Plan:    ARDS due to COVID 19 pneumonia>  ddx includes acute pulmonary edema decdron 10 days Completed remdesivir Continue mechanical ventilation per ARDS protocol Target TVol 6-8cc/kgIBW Target Plateau Pressure < 30cm H20 Target driving pressure less than 15 cm of water Target PaO2 55-65: titrate PEEP/FiO2 per protocol As long as PaO2 to FiO2 ratio is less than 1:150 position in prone position for 16 hours a day Check CVP daily if CVL in place Target CVP less than 4, diurese as necessary Ventilator associated pneumonia prevention protocol 12/15 check supine ABG, likely re-prone  Hyperglycemia SSI, q4h  Hypothyroid synthroid  Depression SSRI  Need for sedation for mechanical ventilation PAD protocol  RASS target -2, -3 Fentanyl/versed Oxycodone/clonazepam    Best practice:  Diet: tube feeding, add free water Pain/Anxiety/Delirium protocol (if indicated): yes, as above VAP protocol (if indicated): yes DVT prophylaxis: lovenox GI prophylaxis: famotidine Glucose control: per TRH Mobility: bed rest Code Status: full Family Communication: will update today Disposition: remain in ICU  Labs   CBC: Recent Labs  Lab 03/20/19 2117 03/04/19 0731 03/05/19 0305 03/06/19 1453 03/07/19 0445 03/07/19 1702 03/08/19 0633 03/08/19 1825  WBC 6.8 6.8 7.9  --  10.1  --  8.2  --   NEUTROABS 4.7 4.9 6.9  --  8.6*  --   --   --   HGB 13.2 13.3 13.4 12.6 10.6* 10.9*  10.8* 11.9*  HCT 41.7 41.8 43.9 37.0 35.4* 32.0* 36.6 35.0*  MCV 93.5 94.1 95.6  --  98.9  --  98.7  --   PLT 247 237 259  --  269  --  298  --     Basic Metabolic Panel: Recent Labs  Lab 03/05/19 0305 03/05/19 0543 03/05/19 1659 03/06/19 0500 03/06/19 1640 03/07/19 1000 03/07/19 1702 03/08/19 0633 03/08/19 1825 03/09/19 0553  NA 142  --   --  145  --  145 144 146* 144 145  K 4.1  --   --  4.6  --  4.6 4.3 4.8 3.8 3.8  CL 107  --   --  108  --  110  --  111  --  105  CO2 24  --   --  23  --  27  --  28  --   30  GLUCOSE 156*  --   --  198*  --  240*  --  208*  --  243*  BUN 22*  --   --  53*  --  69*  --  67*  --  72*  CREATININE 0.78  --   --  1.38*  --  1.05*  --  0.92  --  0.92  CALCIUM 8.4*  --   --  8.6*  --  8.3*  --  8.3*  --  7.9*  MG  --  2.2 2.0 2.2 2.3  --   --  2.5*  --  2.4  PHOS  --  2.7 2.6 3.3 3.4  --   --   --   --   --    GFR: Estimated Creatinine Clearance: 70.5 mL/min (by C-G formula based on SCr of 0.92 mg/dL). Recent Labs  Lab 2019/02/19 2117 2019/02/19 2132 03/04/19 0036 03/04/19 0731 03/05/19 0305 03/07/19 0445 03/08/19 0633  PROCALCITON 0.14  --   --   --   --   --   --   WBC 6.8  --   --  6.8 7.9 10.1 8.2  LATICACIDVEN  --  1.4 1.3  --   --   --   --     Liver Function Tests: Recent Labs  Lab 03/05/19 0305 03/06/19 0500 03/07/19 1000 03/08/19 0633 03/09/19 0553  AST 56* 36 41 57* 35  ALT 28 25 45* 72* 61*  ALKPHOS 75 81 74 72 59  BILITOT 0.5 0.4 0.4 0.3 0.7  PROT 6.9 6.5 5.7* 5.7* 5.4*  ALBUMIN 3.0* 2.9* 2.6* 2.7* 2.6*   No results for input(s): LIPASE, AMYLASE in the last 168 hours. No results for input(s): AMMONIA in the last 168 hours.  ABG    Component Value Date/Time   PHART 7.385 03/08/2019 1825   PCO2ART 51.5 (H) 03/08/2019 1825   PO2ART 64.0 (L) 03/08/2019 1825   HCO3 30.8 (H) 03/08/2019 1825   TCO2 32 03/08/2019 1825   ACIDBASEDEF 1.0 03/07/2019 1702   O2SAT 91.0 03/08/2019 1825     Coagulation Profile: Recent Labs  Lab 03/06/19 1640 03/07/19 0445 03/08/19 0633  INR 1.0 2.1* 1.1    Cardiac Enzymes: No results for input(s): CKTOTAL, CKMB, CKMBINDEX, TROPONINI in the last 168 hours.  HbA1C: HB A1C (BAYER DCA - WAIVED)  Date/Time Value Ref Range Status  12/09/2018 08:41 AM 5.7 <7.0 % Final    Comment:  Diabetic Adult            <7.0                                       Healthy Adult        4.3 - 5.7                                                           (DCCT/NGSP) American  Diabetes Association's Summary of Glycemic Recommendations for Adults with Diabetes: Hemoglobin A1c <7.0%. More stringent glycemic goals (A1c <6.0%) may further reduce complications at the cost of increased risk of hypoglycemia.     CBG: Recent Labs  Lab 03/08/19 1532 03/08/19 1958 03/09/19 0052 03/09/19 0500 03/09/19 0759  GLUCAP 196* 156* 261* 227* 251*    Review of Systems:   Could not obtain due to severe respiratory failure  Past Medical History  She,  has a past medical history of Depression, Hyperlipidemia, and Thyroid disease.   Surgical History    Past Surgical History:  Procedure Laterality Date  . ABDOMINAL HYSTERECTOMY    . BACK SURGERY    . THYROID SURGERY       Social History   reports that she has quit smoking. She has never used smokeless tobacco. She reports that she does not drink alcohol or use drugs.   Family History   Her family history includes Cancer in her father and mother.   Allergies No Known Allergies   Home Medications  Prior to Admission medications   Medication Sig Start Date End Date Taking? Authorizing Provider  amoxicillin-clavulanate (AUGMENTIN) 875-125 MG tablet Take 1 tablet by mouth 2 (two) times daily. Take all of this medication 02/27/19  Yes Mechele Claude, MD  aspirin EC 81 MG tablet Take 81 mg by mouth daily.   Yes [provider]  atorvastatin (LIPITOR) 20 MG tablet TAKE ONE (1) TABLET EACH DAY Patient taking differently: Take 20 mg by mouth daily.  12/09/18  Yes Remus Loffler, PA-C  Calcium Carbonate-Vitamin D (CALCIUM 600+D) 600-200 MG-UNIT TABS Take by mouth.   Yes [provider]  diclofenac sodium (VOLTAREN) 1 % GEL Apply 2 g topically 4 (four) times daily. 12/09/18  Yes Remus Loffler, PA-C  escitalopram (LEXAPRO) 20 MG tablet Take 1 tablet (20 mg total) by mouth daily. 08/20/18  Yes Remus Loffler, PA-C  fluticasone (FLONASE) 50 MCG/ACT nasal spray Place 1 spray into both nostrils daily.  03/01/19   Yes [provider]  ibuprofen (ADVIL) 800 MG tablet TAKE ONE TABLET 3 TIMES A DAY AS NEEDED. Patient taking differently: Take 800 mg by mouth 3 (three) times daily as needed for fever, headache or mild pain.  08/20/18  Yes Remus Loffler, PA-C  levothyroxine (SYNTHROID) 100 MCG tablet TAKE ONE (1) TABLET EACH DAY Patient taking differently: Take 100 mcg by mouth daily before breakfast.  12/09/18  Yes Remus Loffler, PA-C  Multiple Vitamins-Minerals (HAIR SKIN AND NAILS FORMULA) TABS Take 1 tablet by mouth daily.   Yes [provider]  ondansetron (ZOFRAN) 4 MG tablet Take 4 mg by mouth every 8 (eight) hours as needed for nausea or vomiting (DISSOLVE IN THE MOUTH).  03/01/19  Yes [provider]  predniSONE (DELTASONE) 10 MG tablet Take 5 daily for 2 days followed by 4,3,2 and 1 for 2 days each. 02/27/19  Yes Mechele Claude, MD  UNABLE TO FIND Med Name: Greens Complete   Yes [provider]     Critical care time: 35 minutes     Heber [place], MD [place] [facility] Pager: [phone] Cell: [phone] If no response, call [phone]

## 2019-03-09 NOTE — Progress Notes (Signed)
Pt supined without any complications.  Skin integrity is good.

## 2019-03-09 NOTE — Progress Notes (Signed)
Pt's head turned to left without any complications.  Skin integrity is good.

## 2019-03-09 NOTE — Progress Notes (Signed)
Spouse called and updated on patient status and dicussed plan of care. All questions answered at this time.

## 2019-03-09 NOTE — Progress Notes (Signed)
ANTICOAGULATION CONSULT NOTE  Pharmacy Consult for Lovenox Indication: Covid-PACT trial  Patient Measurements: Height: 5' (152.4 cm) Weight: 223 lb 5.2 oz (101.3 kg) IBW/kg (Calculated) : 45.5  Vital Signs: Temp: 98.9 F (37.2 C) (12/14 0800) Temp Source: Axillary (12/14 0800) BP: 146/58 (12/14 0900) Pulse Rate: 51 (12/14 0900)  Labs: Recent Labs    03/07/19 0445 03/07/19 1000 03/07/19 1702 03/08/19 0633 03/08/19 1825 03/09/19 0553  HGB 10.6*  --  10.9* 10.8* 11.9*  --   HCT 35.4*  --  32.0* 36.6 35.0*  --   PLT 269  --   --  298  --   --   LABPROT 23.7*  --   --  13.8  --  14.1  INR 2.1*  --   --  1.1  --  1.1  CREATININE  --  1.05*  --  0.92  --  0.92    Assessment: 58 yo female intubated in ICU with Covid-19 pneumonia.She has been enrolled in the Covid-PACT trial and was randomized to Lovenox full treatment dose.   SCr 0.92 CBC: Hgb remains low/stable, Plt WNL D-dimer 1.85  Goal of Therapy:  Anti-Xa level 0.6-1 units/ml 4hrs after LMWH dose given Monitor platelets by anticoagulation protocol: Yes   Plan:  -Lovenox 100 mg [place] q12h -Monitor s/sx bleeding -F/u anticoagulation choice post ICU   Gretta Arab PharmD, BCPS Clinical pharmacist phone 7am- 5pm: [phone] 03/09/2019 10:15 AM

## 2019-03-10 ENCOUNTER — Inpatient Hospital Stay (HOSPITAL_COMMUNITY): Payer: BC Managed Care – PPO

## 2019-03-10 LAB — PROTIME-INR
INR: 1.1 (ref 0.8–1.2)
Prothrombin Time: 13.9 seconds (ref 11.4–15.2)

## 2019-03-10 LAB — COMPREHENSIVE METABOLIC PANEL
ALT: 56 U/L — ABNORMAL HIGH (ref 0–44)
AST: 26 U/L (ref 15–41)
Albumin: 3 g/dL — ABNORMAL LOW (ref 3.5–5.0)
Alkaline Phosphatase: 65 U/L (ref 38–126)
Anion gap: 9 (ref 5–15)
BUN: 89 mg/dL — ABNORMAL HIGH (ref 6–20)
CO2: 36 mmol/L — ABNORMAL HIGH (ref 22–32)
Calcium: 8.6 mg/dL — ABNORMAL LOW (ref 8.9–10.3)
Chloride: 99 mmol/L (ref 98–111)
Creatinine, Ser: 1.02 mg/dL — ABNORMAL HIGH (ref 0.44–1.00)
GFR calc Af Amer: >60 mL/min (ref >60)
GFR calc non Af Amer: >60 mL/min (ref >60)
Glucose, Bld: 252 mg/dL — ABNORMAL HIGH (ref 70–99)
Potassium: 3.6 mmol/L (ref 3.5–5.1)
Sodium: 144 mmol/L (ref 135–145)
Total Bilirubin: 0.7 mg/dL (ref 0.3–1.2)
Total Protein: 6.2 g/dL — ABNORMAL LOW (ref 6.5–8.1)

## 2019-03-10 LAB — CBC WITH DIFFERENTIAL/PLATELET
Abs Immature Granulocytes: 0.41 10*3/uL — ABNORMAL HIGH (ref 0.00–0.07)
Basophils Absolute: 0 10*3/uL (ref 0.0–0.1)
Basophils Relative: 0 %
Eosinophils Absolute: 0 10*3/uL (ref 0.0–0.5)
Eosinophils Relative: 0 %
HCT: 38.4 % (ref 36.0–46.0)
Hemoglobin: 12.1 g/dL (ref 12.0–15.0)
Immature Granulocytes: 4 %
Lymphocytes Relative: 6 %
Lymphs Abs: 0.6 10*3/uL — ABNORMAL LOW (ref 0.7–4.0)
MCH: 30 pg (ref 26.0–34.0)
MCHC: 31.5 g/dL (ref 30.0–36.0)
MCV: 95 fL (ref 80.0–100.0)
Monocytes Absolute: 0.5 10*3/uL (ref 0.1–1.0)
Monocytes Relative: 5 %
Neutro Abs: 8.7 10*3/uL — ABNORMAL HIGH (ref 1.7–7.7)
Neutrophils Relative %: 85 %
Platelets: 309 10*3/uL (ref 150–400)
RBC: 4.04 MIL/uL (ref 3.87–5.11)
RDW: 13.2 % (ref 11.5–15.5)
WBC: 10.3 10*3/uL (ref 4.0–10.5)
nRBC: 0 % (ref 0.0–0.2)

## 2019-03-10 LAB — GLUCOSE, CAPILLARY
Glucose-Capillary: 184 mg/dL — ABNORMAL HIGH (ref 70–99)
Glucose-Capillary: 209 mg/dL — ABNORMAL HIGH (ref 70–99)
Glucose-Capillary: 238 mg/dL — ABNORMAL HIGH (ref 70–99)
Glucose-Capillary: 256 mg/dL — ABNORMAL HIGH (ref 70–99)
Glucose-Capillary: 154 mg/dL — ABNORMAL HIGH (ref 70–99)
Glucose-Capillary: 192 mg/dL — ABNORMAL HIGH (ref 70–99)

## 2019-03-10 LAB — FIBRINOGEN: Fibrinogen: 307 mg/dL (ref 210–475)

## 2019-03-10 MED ORDER — FREE WATER
200.0000 mL | Status: DC
  Administered 2019-03-10 – 2019-03-11 (×5): 200 mL

## 2019-03-10 MED ORDER — POTASSIUM CHLORIDE 20 MEQ/15ML (10%) PO SOLN
20.0000 meq | Freq: Two times a day (BID) | ORAL | Status: AC
  Administered 2019-03-10 (×2): 20 meq via ORAL
  Filled 2019-03-10 (×2): qty 15

## 2019-03-10 MED ORDER — FUROSEMIDE 10 MG/ML IJ SOLN
40.0000 mg | Freq: Four times a day (QID) | INTRAMUSCULAR | Status: AC
  Administered 2019-03-10 (×3): 40 mg via INTRAVENOUS
  Filled 2019-03-10 (×3): qty 4

## 2019-03-10 NOTE — Progress Notes (Signed)
NAME:  Deborah Bond, MRN:  [id number], DOB:  [birth year], LOS: 7 ADMISSION DATE:  03-11-19, CONSULTATION DATE:  12/10 REFERRING MD:  Jarvis Newcomer, CHIEF COMPLAINT:  Dyspnea  Brief History   58 y/o female admitted on 12/8 with dyspnea, cough and hypoxemia due to COVID 19.  Moved to [facility] on 12/9.  Early in the AM on 12/10 she was moved to the ICU for worsening hypoxemia.    Past Medical History  Hyperlipidemia Thyroid disease Depression  Significant Hospital Events   12/9 admission to [facility] 12/10 ICU transfer, intubation  Consults:  PCCM  Procedures:  12/10 PCCM>   Significant Diagnostic Tests:    Micro Data:  12/8 POC SARS COV 2 > positive 12/8 blood >   Antimicrobials/COVID Rx:  12/8 ceftriaxone x1 12/8 azithromycin x1 12/8 remdesivir > 12/12 12/8 decadron >  12/9 tocilizumab x1  12/9 CCP x1  Interim history/subjective:   PEEP, FiO2 down significantly this morning No other acute events overnight  Objective   Blood pressure (!) 143/55, pulse (!) 48, temperature 98.3 F (36.8 C), temperature source Axillary, resp. rate (!) 35, height 5' (1.524 m), weight 101.3 kg, SpO2 95 %.    Vent Mode: PRVC FiO2 (%):  [40 %-60 %] 40 % Set Rate:  [35 bmp] 35 bmp Vt Set:  [360 mL] 360 mL PEEP:  [8 cmH20-14 cmH20] 8 cmH20 Plateau Pressure:  [22 cmH20-31 cmH20] 22 cmH20   Intake/Output Summary (Last 24 hours) at 03/10/2019 1016 Last data filed at 03/10/2019 1000 Gross per 24 hour  Intake 4755.63 ml  Output 3765 ml  Net 990.63 ml   Filed Weights   03/04/19 1200 03/06/19 0500 03/09/19 0500  Weight: 107 kg 100.3 kg 101.3 kg    Examination:  General:  In bed on vent HENT: NCAT ETT in place PULM: CTA B, vent supported breathing CV: RRR, no mgr GI: BS+, soft, nontender MSK: normal bulk and tone Neuro: sedated on vent  12/15 CXR images personally reviewed: bilateral airspace disease, ETT in place  Resolved Hospital Problem list     Assessment & Plan:  ARDS due to  COVID 19 pneumonia ddx includes acute pulmonary edema Continue mechanical ventilation per ARDS protocol Target TVol 6-8cc/kgIBW Target Plateau Pressure < 30cm H20 Target driving pressure less than 15 cm of water Target PaO2 55-65: titrate PEEP/FiO2 per protocol As long as PaO2 to FiO2 ratio is less than 1:150 position in prone position for 16 hours a day Check CVP daily if CVL in place Target CVP less than 4, diurese as necessary Ventilator associated pneumonia prevention protocol 12/15 improving significantly, diurese, decrease sedation, Decadron 10 days  Hyperglycemia SSI, q4h  Hypothyroid synthroid  Depression SSRI  Need for sedation for mechanical ventilation PAD protocol Change sedation target to RASS -1 Wean off versed infusion Stop clonazepam and oxycodone Continue fentanyl infusion   Best practice:  Diet: tube feeding, add free water Pain/Anxiety/Delirium protocol (if indicated): yes, as above VAP protocol (if indicated): yes DVT prophylaxis: lovenox GI prophylaxis: famotidine Glucose control: per TRH Mobility: bed rest Code Status: full Family Communication: will update today Disposition: remain in ICU  Labs   CBC: Recent Labs  Lab 03-11-19 2117 03/04/19 0731 03/05/19 0305 03/07/19 0445 03/07/19 1702 03/08/19 0633 03/08/19 1825 03/09/19 1827 03/10/19 0545  WBC 6.8 6.8 7.9 10.1  --  8.2  --   --  10.3  NEUTROABS 4.7 4.9 6.9 8.6*  --   --   --   --  8.7*  HGB 13.2 13.3 13.4 10.6* 10.9* 10.8* 11.9* 12.9 12.1  HCT 41.7 41.8 43.9 35.4* 32.0* 36.6 35.0* 38.0 38.4  MCV 93.5 94.1 95.6 98.9  --  98.7  --   --  95.0  PLT 247 237 259 269  --  298  --   --  309    Basic Metabolic Panel: Recent Labs  Lab 03/05/19 0305 03/05/19 0543 03/05/19 1659 03/06/19 0500 03/06/19 1640 03/07/19 1000 03/08/19 0633 03/08/19 1825 03/09/19 0553 03/09/19 1827 03/10/19 0545  NA   < >  --   --  145  --  145 146* 144 145 141 144  K   < >  --   --  4.6  --  4.6 4.8  3.8 3.8 3.6 3.6  CL  --   --   --  108  --  110 111  --  105  --  99  CO2  --   --   --  23  --  27 28  --  30  --  36*  GLUCOSE  --   --   --  198*  --  240* 208*  --  243*  --  252*  BUN  --   --   --  53*  --  69* 67*  --  72*  --  89*  CREATININE  --   --   --  1.38*  --  1.05* 0.92  --  0.92  --  1.02*  CALCIUM  --   --   --  8.6*  --  8.3* 8.3*  --  7.9*  --  8.6*  MG  --  2.2 2.0 2.2 2.3  --  2.5*  --  2.4  --   --   PHOS  --  2.7 2.6 3.3 3.4  --   --   --   --   --   --    < > = values in this interval not displayed.   GFR: Estimated Creatinine Clearance: 63.6 mL/min (A) (by C-G formula based on SCr of 1.02 mg/dL (H)). Recent Labs  Lab 03/22/2019 2117 03/22/2019 2132 03/04/19 0036 03/05/19 0305 03/07/19 0445 03/08/19 0633 03/10/19 0545  PROCALCITON 0.14  --   --   --   --   --   --   WBC 6.8  --   --  7.9 10.1 8.2 10.3  LATICACIDVEN  --  1.4 1.3  --   --   --   --     Liver Function Tests: Recent Labs  Lab 03/06/19 0500 03/07/19 1000 03/08/19 0633 03/09/19 0553 03/10/19 0545  AST 36 41 57* 35 26  ALT 25 45* 72* 61* 56*  ALKPHOS 81 74 72 59 65  BILITOT 0.4 0.4 0.3 0.7 0.7  PROT 6.5 5.7* 5.7* 5.4* 6.2*  ALBUMIN 2.9* 2.6* 2.7* 2.6* 3.0*   No results for input(s): LIPASE, AMYLASE in the last 168 hours. No results for input(s): AMMONIA in the last 168 hours.  ABG    Component Value Date/Time   PHART 7.422 03/09/2019 1827   PCO2ART 57.9 (H) 03/09/2019 1827   PO2ART 112.0 (H) 03/09/2019 1827   HCO3 37.6 (H) 03/09/2019 1827   TCO2 39 (H) 03/09/2019 1827   ACIDBASEDEF 1.0 03/07/2019 1702   O2SAT 98.0 03/09/2019 1827     Coagulation Profile: Recent Labs  Lab 03/06/19 1640 03/07/19 0445 03/08/19 0633 03/09/19 0553 03/10/19 0545  INR 1.0 2.1* 1.1 1.1 1.1  Cardiac Enzymes: No results for input(s): CKTOTAL, CKMB, CKMBINDEX, TROPONINI in the last 168 hours.  HbA1C: HB A1C (BAYER DCA - WAIVED)  Date/Time Value Ref Range Status  12/09/2018 08:41 AM 5.7  <7.0 % Final    Comment:                                          Diabetic Adult            <7.0                                       Healthy Adult        4.3 - 5.7                                                           (DCCT/NGSP) American Diabetes Association's Summary of Glycemic Recommendations for Adults with Diabetes: Hemoglobin A1c <7.0%. More stringent glycemic goals (A1c <6.0%) may further reduce complications at the cost of increased risk of hypoglycemia.     CBG: Recent Labs  Lab 03/09/19 1634 03/09/19 1959 03/09/19 2315 03/10/19 0430 03/10/19 0728  GLUCAP 257* 239* 237* 209* 238*    Review of Systems:   Could not obtain due to severe respiratory failure  Past Medical History  She,  has a past medical history of Depression, Hyperlipidemia, and Thyroid disease.   Surgical History    Past Surgical History:  Procedure Laterality Date  . ABDOMINAL HYSTERECTOMY    . BACK SURGERY    . THYROID SURGERY       Social History   reports that she has quit smoking. She has never used smokeless tobacco. She reports that she does not drink alcohol or use drugs.   Family History   Her family history includes Cancer in her father and mother.   Allergies No Known Allergies   Home Medications  Prior to Admission medications   Medication Sig Start Date End Date Taking? Authorizing Provider  amoxicillin-clavulanate (AUGMENTIN) 875-125 MG tablet Take 1 tablet by mouth 2 (two) times daily. Take all of this medication 02/27/19  Yes Claretta Fraise, MD  aspirin EC 81 MG tablet Take 81 mg by mouth daily.   Yes [provider]  atorvastatin (LIPITOR) 20 MG tablet TAKE ONE (1) TABLET EACH DAY Patient taking differently: Take 20 mg by mouth daily.  12/09/18  Yes Terald Sleeper, PA-C  Calcium Carbonate-Vitamin D (CALCIUM 600+D) 600-200 MG-UNIT TABS Take by mouth.   Yes [provider]  diclofenac sodium (VOLTAREN) 1 % GEL Apply 2 g topically 4 (four) times daily.  12/09/18  Yes Terald Sleeper, PA-C  escitalopram (LEXAPRO) 20 MG tablet Take 1 tablet (20 mg total) by mouth daily. 08/20/18  Yes Terald Sleeper, PA-C  fluticasone (FLONASE) 50 MCG/ACT nasal spray Place 1 spray into both nostrils daily.  03/01/19  Yes [provider]  ibuprofen (ADVIL) 800 MG tablet TAKE ONE TABLET 3 TIMES A DAY AS NEEDED. Patient taking differently: Take 800 mg by mouth 3 (three) times daily as needed for fever, headache or mild pain.  08/20/18  Yes Ronnald Ramp,  Wayland SalinasAngel S, PA-C  levothyroxine (SYNTHROID) 100 MCG tablet TAKE ONE (1) TABLET EACH DAY Patient taking differently: Take 100 mcg by mouth daily before breakfast.  12/09/18  Yes Remus LofflerJones, Angel S, PA-C  Multiple Vitamins-Minerals (HAIR SKIN AND NAILS FORMULA) TABS Take 1 tablet by mouth daily.   Yes [provider]  ondansetron (ZOFRAN) 4 MG tablet Take 4 mg by mouth every 8 (eight) hours as needed for nausea or vomiting (DISSOLVE IN THE MOUTH).  03/01/19  Yes [provider]  predniSONE (DELTASONE) 10 MG tablet Take 5 daily for 2 days followed by 4,3,2 and 1 for 2 days each. 02/27/19  Yes Mechele ClaudeStacks, Warren, MD  UNABLE TO FIND Med Name: Greens Complete   Yes [provider]     Critical care time: 35 minutes     Heber CarolinaBrent McQuaid, MD [place] [facility] Pager: [phone] Cell: [phone] If no response, call [phone]

## 2019-03-10 NOTE — Progress Notes (Signed)
[facility] PCCM Evening Rounds  Quiet day today, remains on lower vent settings  I called her husband for an update   Roselie Awkward, MD [place] [facility] Pager: [phone] Cell: [phone] If no response, call [phone]

## 2019-03-11 ENCOUNTER — Encounter (HOSPITAL_COMMUNITY): Payer: Self-pay | Admitting: Family Medicine

## 2019-03-11 LAB — GLUCOSE, CAPILLARY
Glucose-Capillary: 178 mg/dL — ABNORMAL HIGH (ref 70–99)
Glucose-Capillary: 182 mg/dL — ABNORMAL HIGH (ref 70–99)
Glucose-Capillary: 199 mg/dL — ABNORMAL HIGH (ref 70–99)
Glucose-Capillary: 207 mg/dL — ABNORMAL HIGH (ref 70–99)
Glucose-Capillary: 249 mg/dL — ABNORMAL HIGH (ref 70–99)
Glucose-Capillary: 260 mg/dL — ABNORMAL HIGH (ref 70–99)

## 2019-03-11 LAB — PROTIME-INR
INR: 1.1 (ref 0.8–1.2)
Prothrombin Time: 14.5 seconds (ref 11.4–15.2)

## 2019-03-11 MED ORDER — INSULIN DETEMIR 100 UNIT/ML ~~LOC~~ SOLN
8.0000 [IU] | Freq: Two times a day (BID) | SUBCUTANEOUS | Status: DC
  Administered 2019-03-11 – 2019-03-14 (×8): 8 [IU] via SUBCUTANEOUS
  Filled 2019-03-11 (×10): qty 0.08

## 2019-03-11 MED ORDER — FREE WATER
300.0000 mL | Status: DC
  Administered 2019-03-11 – 2019-03-14 (×19): 300 mL

## 2019-03-11 MED ORDER — DEXMEDETOMIDINE HCL IN NACL 400 MCG/100ML IV SOLN
0.4000 ug/kg/h | INTRAVENOUS | Status: DC
  Administered 2019-03-11: 0.7 ug/kg/h via INTRAVENOUS
  Administered 2019-03-11: 0.4 ug/kg/h via INTRAVENOUS
  Administered 2019-03-12: 0.5 ug/kg/h via INTRAVENOUS
  Administered 2019-03-12: 0.6 ug/kg/h via INTRAVENOUS
  Administered 2019-03-12: 1 ug/kg/h via INTRAVENOUS
  Administered 2019-03-12: 0.2 ug/kg/h via INTRAVENOUS
  Administered 2019-03-13: 03:00:00 1.2 ug/kg/h via INTRAVENOUS
  Administered 2019-03-13: 07:00:00 0.6 ug/kg/h via INTRAVENOUS
  Filled 2019-03-11 (×9): qty 100

## 2019-03-11 MED ORDER — SENNOSIDES 8.8 MG/5ML PO SYRP: 10.0000 mL | ORAL_SOLUTION | Freq: Two times a day (BID) | ORAL | Status: DC

## 2019-03-11 MED ORDER — INSULIN DETEMIR 100 UNIT/ML ~~LOC~~ SOLN: 10.0000 [IU] | Freq: Two times a day (BID) | SUBCUTANEOUS | Status: DC

## 2019-03-11 MED ORDER — FREE WATER: 400.0000 mL | Status: DC

## 2019-03-11 NOTE — Progress Notes (Signed)
Wasted 38ml of versed in sink with Cecilio Asper, RN.

## 2019-03-11 NOTE — Progress Notes (Signed)
Spoke with patient's husband, Moniqua Engebretsen, and provided full update/answered all questions.

## 2019-03-11 NOTE — Progress Notes (Signed)
NAME:  Deborah Bond, MRN:  [id number], DOB:  [birth year], LOS: 8 ADMISSION DATE:  03/13/2019, CONSULTATION DATE:  12/10 REFERRING MD:  Jarvis Newcomer, CHIEF COMPLAINT:  Dyspnea  Brief History   58 y/o female admitted on 12/8 with dyspnea, cough and hypoxemia due to COVID 19.  Moved to [facility] on 12/9.  Early in the AM on 12/10 she was moved to the ICU for worsening hypoxemia.    Past Medical History  Hyperlipidemia Thyroid disease Depression  Significant Hospital Events   12/9 admission to [facility] 12/10 ICU transfer, intubation  Consults:  PCCM  Procedures:  12/10 PCCM>   Significant Diagnostic Tests:    Micro Data:  12/8 POC SARS COV 2 > positive 12/8 blood > neg  Antimicrobials/COVID Rx:  12/8 ceftriaxone x1 12/8 azithromycin x1 12/8 remdesivir > 12/12 12/8 decadron >  12/9 tocilizumab x1  12/9 CCP x1  Interim history/subjective:  No major events overnight.  Objective   Blood pressure (!) 120/58, pulse 61, temperature 97.9 F (36.6 C), temperature source Oral, resp. rate (!) 35, height 5' (1.524 m), weight 101.3 kg, SpO2 96 %.    Vent Mode: PRVC FiO2 (%):  [40 %] 40 % Set Rate:  [35 bmp] 35 bmp Vt Set:  [360 mL] 360 mL PEEP:  [8 cmH20] 8 cmH20 Plateau Pressure:  [22 cmH20-25 cmH20] 24 cmH20   Intake/Output Summary (Last 24 hours) at 03/11/2019 0729 Last data filed at 03/11/2019 0500 Gross per 24 hour  Intake 2037.69 ml  Output 4385 ml  Net -2347.31 ml   Filed Weights   03/04/19 1200 03/06/19 0500 03/09/19 0500  Weight: 107 kg 100.3 kg 101.3 kg    Examination:  GEN: sedated middle aged woman on vent HEENT: ETT in place minimal secretions CV: RRR, ext warm PULM: Clear, mild tachypnea, + accessory muscle use GI: Soft, +BS EXT: No edema NEURO: Moves all 4 ext with stimulation PSYCH: RASS -1 SKIN: No rashes  CXR 12/15- bilateral airspace disease maybe slightly better than day prior  Resolved Hospital Problem list     Assessment & Plan:  ARDS due to  COVID 19 pneumonia ddx includes acute pulmonary edema Continue mechanical ventilation per ARDS protocol Target TVol 6-8cc/kgIBW Target Plateau Pressure < 30cm H20 Target driving pressure less than 15 cm of water Target PaO2 55-65: titrate PEEP/FiO2 per protocol As long as PaO2 to FiO2 ratio is less than 1:150 position in prone position for 16 hours a day Check CVP daily if CVL in place Target CVP less than 4, diurese as necessary Ventilator associated pneumonia prevention protocol 12/16 hold further diuresis, SBT, may need 1 more day  Uremia, hypernatemia, mild AKI- hold diuretics today  Hyperglycemia- still up Add levemir, continue SSI, q4h, hold levemir when comes off steroids  Hypothyroid- TSH good Sept 2020 synthroid  Depression SSRI  Need for sedation for mechanical ventilation PAD protocol Change sedation target to RASS -1 Wean off benzodiazepines, precedex instead Daily SATs   Best practice:  Diet: tube feeding, free water Pain/Anxiety/Delirium protocol (if indicated): yes, as above VAP protocol (if indicated): yes DVT prophylaxis: lovenox GI prophylaxis: famotidine Glucose control: as above Mobility: bed rest Code Status: full Family Communication: updated husband today Disposition: remain in ICU  Labs   CBC: Recent Labs  Lab 03/04/19 0731 03/05/19 0305 03/07/19 0445 03/07/19 1702 03/08/19 0633 03/08/19 1825 03/09/19 1827 03/10/19 0545  WBC 6.8 7.9 10.1  --  8.2  --   --  10.3  NEUTROABS 4.9 6.9  8.6*  --   --   --   --  8.7*  HGB 13.3 13.4 10.6* 10.9* 10.8* 11.9* 12.9 12.1  HCT 41.8 43.9 35.4* 32.0* 36.6 35.0* 38.0 38.4  MCV 94.1 95.6 98.9  --  98.7  --   --  95.0  PLT 237 259 269  --  298  --   --  309    Basic Metabolic Panel: Recent Labs  Lab 03/05/19 0305 03/05/19 0543 03/05/19 1659 03/06/19 0500 03/06/19 1640 03/07/19 1000 03/08/19 0633 03/08/19 1825 03/09/19 0553 03/09/19 1827 03/10/19 0545  NA   < >  --   --  145  --  145  146* 144 145 141 144  K   < >  --   --  4.6  --  4.6 4.8 3.8 3.8 3.6 3.6  CL  --   --   --  108  --  110 111  --  105  --  99  CO2  --   --   --  23  --  27 28  --  30  --  36*  GLUCOSE  --   --   --  198*  --  240* 208*  --  243*  --  252*  BUN  --   --   --  53*  --  69* 67*  --  72*  --  89*  CREATININE  --   --   --  1.38*  --  1.05* 0.92  --  0.92  --  1.02*  CALCIUM  --   --   --  8.6*  --  8.3* 8.3*  --  7.9*  --  8.6*  MG  --  2.2 2.0 2.2 2.3  --  2.5*  --  2.4  --   --   PHOS  --  2.7 2.6 3.3 3.4  --   --   --   --   --   --    < > = values in this interval not displayed.   GFR: Estimated Creatinine Clearance: 63.6 mL/min (A) (by C-G formula based on SCr of 1.02 mg/dL (H)). Recent Labs  Lab 03/05/19 0305 03/07/19 0445 03/08/19 0633 03/10/19 0545  WBC 7.9 10.1 8.2 10.3    Liver Function Tests: Recent Labs  Lab 03/06/19 0500 03/07/19 1000 03/08/19 0633 03/09/19 0553 03/10/19 0545  AST 36 41 57* 35 26  ALT 25 45* 72* 61* 56*  ALKPHOS 81 74 72 59 65  BILITOT 0.4 0.4 0.3 0.7 0.7  PROT 6.5 5.7* 5.7* 5.4* 6.2*  ALBUMIN 2.9* 2.6* 2.7* 2.6* 3.0*   No results for input(s): LIPASE, AMYLASE in the last 168 hours. No results for input(s): AMMONIA in the last 168 hours.  ABG    Component Value Date/Time   PHART 7.422 03/09/2019 1827   PCO2ART 57.9 (H) 03/09/2019 1827   PO2ART 112.0 (H) 03/09/2019 1827   HCO3 37.6 (H) 03/09/2019 1827   TCO2 39 (H) 03/09/2019 1827   ACIDBASEDEF 1.0 03/07/2019 1702   O2SAT 98.0 03/09/2019 1827     Coagulation Profile: Recent Labs  Lab 03/07/19 0445 03/08/19 0633 03/09/19 0553 03/10/19 0545 03/11/19 0500  INR 2.1* 1.1 1.1 1.1 1.1    Cardiac Enzymes: No results for input(s): CKTOTAL, CKMB, CKMBINDEX, TROPONINI in the last 168 hours.  HbA1C: HB A1C (BAYER DCA - WAIVED)  Date/Time Value Ref Range Status  12/09/2018 08:41 AM 5.7 <7.0 % Final  Comment:                                          Diabetic Adult             <7.0                                       Healthy Adult        4.3 - 5.7                                                           (DCCT/NGSP) American Diabetes Association's Summary of Glycemic Recommendations for Adults with Diabetes: Hemoglobin A1c <7.0%. More stringent glycemic goals (A1c <6.0%) may further reduce complications at the cost of increased risk of hypoglycemia.     CBG: Recent Labs  Lab 03/10/19 1111 03/10/19 1511 03/10/19 1950 03/11/19 0045 03/11/19 0411  GLUCAP 256* 154* 192* 249* 260*    Review of Systems:   Could not obtain due to severe respiratory failure  Past Medical History  She,  has a past medical history of Depression, Hyperlipidemia, and Thyroid disease.   Surgical History    Past Surgical History:  Procedure Laterality Date  . ABDOMINAL HYSTERECTOMY    . BACK SURGERY    . THYROID SURGERY       Social History   reports that she has quit smoking. She has never used smokeless tobacco. She reports that she does not drink alcohol or use drugs.   Family History   Her family history includes Cancer in her father and mother.   Allergies No Known Allergies   Home Medications  Prior to Admission medications   Medication Sig Start Date End Date Taking? Authorizing Provider  amoxicillin-clavulanate (AUGMENTIN) 875-125 MG tablet Take 1 tablet by mouth 2 (two) times daily. Take all of this medication 02/27/19  Yes Mechele Claude, MD  aspirin EC 81 MG tablet Take 81 mg by mouth daily.   Yes [provider]  atorvastatin (LIPITOR) 20 MG tablet TAKE ONE (1) TABLET EACH DAY Patient taking differently: Take 20 mg by mouth daily.  12/09/18  Yes Remus Loffler, PA-C  Calcium Carbonate-Vitamin D (CALCIUM 600+D) 600-200 MG-UNIT TABS Take by mouth.   Yes [provider]  diclofenac sodium (VOLTAREN) 1 % GEL Apply 2 g topically 4 (four) times daily. 12/09/18  Yes Remus Loffler, PA-C  escitalopram (LEXAPRO) 20 MG tablet Take 1 tablet (20 mg  total) by mouth daily. 08/20/18  Yes Remus Loffler, PA-C  fluticasone (FLONASE) 50 MCG/ACT nasal spray Place 1 spray into both nostrils daily.  03/01/19  Yes [provider]  ibuprofen (ADVIL) 800 MG tablet TAKE ONE TABLET 3 TIMES A DAY AS NEEDED. Patient taking differently: Take 800 mg by mouth 3 (three) times daily as needed for fever, headache or mild pain.  08/20/18  Yes Remus Loffler, PA-C  levothyroxine (SYNTHROID) 100 MCG tablet TAKE ONE (1) TABLET EACH DAY Patient taking differently: Take 100 mcg by mouth daily before breakfast.  12/09/18  Yes Remus Loffler, PA-C  Multiple Vitamins-Minerals (HAIR SKIN AND  NAILS FORMULA) TABS Take 1 tablet by mouth daily.   Yes [provider]  ondansetron (ZOFRAN) 4 MG tablet Take 4 mg by mouth every 8 (eight) hours as needed for nausea or vomiting (DISSOLVE IN THE MOUTH).  03/01/19  Yes [provider]  predniSONE (DELTASONE) 10 MG tablet Take 5 daily for 2 days followed by 4,3,2 and 1 for 2 days each. 02/27/19  Yes Claretta Fraise, MD  UNABLE TO FIND Med Name: Greens Complete   Yes [provider]      The patient is critically ill with multiple organ systems failure and requires high complexity decision making for assessment and support, frequent evaluation and titration of therapies, application of advanced monitoring technologies and extensive interpretation of multiple databases. Critical Care Time devoted to patient care services described in this note independent of APP/resident time (if applicable)  is 31 minutes.   Erskine Emery MD [facility] 03/11/2019 7:35 AM Personal pager: [phone] If unanswered, please page CCM On-call: [phone]

## 2019-03-12 DIAGNOSIS — L8990 Pressure ulcer of unspecified site, unspecified stage: Secondary | ICD-10-CM | POA: Insufficient documentation

## 2019-03-12 LAB — GLUCOSE, CAPILLARY
Glucose-Capillary: 249 mg/dL — ABNORMAL HIGH (ref 70–99)
Glucose-Capillary: 218 mg/dL — ABNORMAL HIGH (ref 70–99)
Glucose-Capillary: 251 mg/dL — ABNORMAL HIGH (ref 70–99)
Glucose-Capillary: 228 mg/dL — ABNORMAL HIGH (ref 70–99)
Glucose-Capillary: 143 mg/dL — ABNORMAL HIGH (ref 70–99)

## 2019-03-12 LAB — PROTIME-INR
INR: 1.1 (ref 0.8–1.2)
Prothrombin Time: 13.6 seconds (ref 11.4–15.2)

## 2019-03-12 MED ORDER — PRO-STAT SUGAR FREE PO LIQD
60.0000 mL | Freq: Two times a day (BID) | ORAL | Status: DC
  Administered 2019-03-12 – 2019-03-20 (×14): 60 mL
  Filled 2019-03-12 (×15): qty 60

## 2019-03-12 MED ORDER — VITAL 1.5 CAL PO LIQD
1000.0000 mL | ORAL | Status: DC
  Administered 2019-03-12 – 2019-03-16 (×3): 1000 mL
  Filled 2019-03-12 (×9): qty 1000

## 2019-03-12 NOTE — Progress Notes (Signed)
Nutrition Follow-up  DOCUMENTATION CODES:   Morbid obesity  INTERVENTION:   Increase Vital 1.5 to 45 ml/hr via OG tube 60 ml Prostat BID  Provides: 2020 kcal, 132 grams protein, and 825 ml free water. Total free water: 2625 ml   NUTRITION DIAGNOSIS:   Increased nutrient needs related to acute illness(COVID 19) as evidenced by estimated needs.  Ongoing.   GOAL:   Patient will meet greater than or equal to 90% of their needs  Progressing.   MONITOR:   TF tolerance, I & O's, Labs, Weight trends, Vent status  REASON FOR ASSESSMENT:   Consult, Ventilator Enteral/tube feeding initiation and management  ASSESSMENT:   58 y.o. female with medical history significant of hypertension, hypothyroidism, depression, tenosynovitis and restless leg syndrome dx with COVID-19 three days ago presented to ED on 12/9 with progressing SOB, found to be hypoxic in the ED and placed on 6L of oxygen, quickly requiring NRB. Intubated on 12/10  Spoke with pt's RN yesterday. Pt is more awake but unable to extubated yesterday.     Patient is currently intubated on ventilator support MV: 11.2 L/min Temp (24hrs), Avg:99.2 F (37.3 C), Min:99 F (37.2 C), Max:99.4 F (37.4 C)  Medications reviewed and include: decadron, levemir, miralax, vitmain C, zinc Precedex  300 ml free water every 4 hours = 1800 ml  Labs reviewed:  CBG's: [phone]  Vital 1.5 @ 40 mL/hr and Prostat 17mL TID Provides 1840 kcal, 125 grams protein  Diet Order:   Diet Order    None      EDUCATION NEEDS:   Not appropriate for education at this time  Skin:  Skin Assessment: Skin Integrity Issues: Skin Integrity Issues:: Stage II Stage II: L jaw  Last BM:  200 ml via rectal tube  Height:   Ht Readings from Last 1 Encounters:  03/05/19 5' (1.524 m)    Weight:   Wt Readings from Last 1 Encounters:  03/12/19 105.9 kg    Ideal Body Weight:  45.5 kg  BMI:  Body mass index is 45.6  kg/m.  Estimated Nutritional Needs:   Kcal:  2993-7169  Protein:  120-140 grams  Fluid:  >/= 2 L  Maylon Peppers RD, LDN, CNSC [phone] Pager [phone] After Hours Pager

## 2019-03-12 NOTE — Progress Notes (Signed)
Spouse Lennette Bihari updated via phone (w Arnoldo Hooker.) Plan of care reviewed. No questions at this time.

## 2019-03-12 NOTE — Progress Notes (Signed)
NAME:  Deborah Bond, MRN:  [id number], DOB:  [birth year], LOS: 9 ADMISSION DATE:  03/07/2019, CONSULTATION DATE:  12/10 REFERRING MD:  Jarvis NewcomerGrunz, CHIEF COMPLAINT:  Dyspnea  Brief History   58 y/o female admitted on 12/8 with dyspnea, cough and hypoxemia due to COVID 19.  [facility]  Early in the AM on 12/10 she was moved to the ICU for worsening hypoxemia.    Past Medical History  Hyperlipidemia Thyroid disease Depression  Significant Hospital Events   [facility]10 ICU transfer, intubation  Consults:  PCCM  Procedures:  12/10 PCCM>   Significant Diagnostic Tests:    Micro Data:  12/8 POC SARS COV 2 > positive 12/8 blood >   Antimicrobials/COVID Rx:  12/8 ceftriaxone x1 12/8 azithromycin x1 12/8 remdesivir > 12/12 12/8 decadron >  12/9 tocilizumab x1  12/9 CCP x1  Interim history/subjective:   Weaned several hours yesterday, then became tachypnic in afternoon, put back on full support Resting comfortably this morning on pressure support More awake, looking around  Objective   Blood pressure (!) 154/83, pulse (!) 55, temperature 99.2 F (37.3 C), temperature source Axillary, resp. rate 18, height 5' (1.524 m), weight 105.9 kg, SpO2 94 %.    Vent Mode: PSV FiO2 (%):  [40 %] 40 % Set Rate:  [35 bmp] 35 bmp Vt Set:  [360 mL] 360 mL PEEP:  [5 cmH20] 5 cmH20 Pressure Support:  [10 cmH20-12 cmH20] 10 cmH20 Plateau Pressure:  [20 cmH20-21 cmH20] 20 cmH20   Intake/Output Summary (Last 24 hours) at 03/12/2019 [id number] Last data filed at 03/12/2019 0800 Gross per 24 hour  Intake 2552.63 ml  Output 1735 ml  Net 817.63 ml   Filed Weights   03/06/19 0500 03/09/19 0500 03/12/19 0421  Weight: 100.3 kg 101.3 kg 105.9 kg    Examination:  General:  In bed on vent HENT: NCAT ETT in place PULM: CTA B, vent supported breathing CV: RRR, no mgr GI: BS+, soft, nontender MSK: normal bulk and tone Neuro: sedated on vent   12/15 CXR images  personally reviewed: bilateral airspace disease, ETT in place  Resolved Hospital Problem list     Assessment & Plan:  ARDS due to COVID 19 pneumonia : improving oxygenation ddx includes acute pulmonary edema Wean to pressure support today> as long as tolerated WUA, stop sedation as able VAP prevention  Hyperglycemia SSI, q4h detemir  Hypothyroid synthroid  Depression SSRI  Need for sedation for mechanical ventilation PAD protocol RASS target 0 Wean off fentanyl and precedex   Best practice:  Diet: tube feeding, add free water Pain/Anxiety/Delirium protocol (if indicated): yes, as above VAP protocol (if indicated): yes DVT prophylaxis: lovenox GI prophylaxis: famotidine Glucose control: per TRH Mobility: bed rest Code Status: full Family Communication: will update today Disposition: remain in ICU  Labs   CBC: Recent Labs  Lab 03/07/19 0445 03/07/19 1702 03/08/19 0633 03/08/19 1825 03/09/19 1827 03/10/19 0545  WBC 10.1  --  8.2  --   --  10.3  NEUTROABS 8.6*  --   --   --   --  8.7*  HGB 10.6* 10.9* 10.8* 11.9* 12.9 12.1  HCT 35.4* 32.0* 36.6 35.0* 38.0 38.4  MCV 98.9  --  98.7  --   --  95.0  PLT 269  --  298  --   --  309    Basic Metabolic Panel: Recent Labs  Lab 03/05/19 1659 03/06/19 0500 03/06/19 1640 03/07/19  1000 03/08/19 0633 03/08/19 1825 03/09/19 0553 03/09/19 1827 03/10/19 0545  NA  --  145  --  145 146* 144 145 141 144  K  --  4.6  --  4.6 4.8 3.8 3.8 3.6 3.6  CL  --  108  --  110 111  --  105  --  99  CO2  --  23  --  27 28  --  30  --  36*  GLUCOSE  --  198*  --  240* 208*  --  243*  --  252*  BUN  --  53*  --  69* 67*  --  72*  --  89*  CREATININE  --  1.38*  --  1.05* 0.92  --  0.92  --  1.02*  CALCIUM  --  8.6*  --  8.3* 8.3*  --  7.9*  --  8.6*  MG 2.0 2.2 2.3  --  2.5*  --  2.4  --   --   PHOS 2.6 3.3 3.4  --   --   --   --   --   --    GFR: Estimated Creatinine Clearance: 65.3 mL/min (A) (by C-G formula based on SCr  of 1.02 mg/dL (H)). Recent Labs  Lab 03/07/19 0445 03/08/19 0633 03/10/19 0545  WBC 10.1 8.2 10.3    Liver Function Tests: Recent Labs  Lab 03/06/19 0500 03/07/19 1000 03/08/19 0633 03/09/19 0553 03/10/19 0545  AST 36 41 57* 35 26  ALT 25 45* 72* 61* 56*  ALKPHOS 81 74 72 59 65  BILITOT 0.4 0.4 0.3 0.7 0.7  PROT 6.5 5.7* 5.7* 5.4* 6.2*  ALBUMIN 2.9* 2.6* 2.7* 2.6* 3.0*   No results for input(s): LIPASE, AMYLASE in the last 168 hours. No results for input(s): AMMONIA in the last 168 hours.  ABG    Component Value Date/Time   PHART 7.422 03/09/2019 1827   PCO2ART 57.9 (H) 03/09/2019 1827   PO2ART 112.0 (H) 03/09/2019 1827   HCO3 37.6 (H) 03/09/2019 1827   TCO2 39 (H) 03/09/2019 1827   ACIDBASEDEF 1.0 03/07/2019 1702   O2SAT 98.0 03/09/2019 1827     Coagulation Profile: Recent Labs  Lab 03/08/19 0633 03/09/19 0553 03/10/19 0545 03/11/19 0500 03/12/19 0500  INR 1.1 1.1 1.1 1.1 1.1    Cardiac Enzymes: No results for input(s): CKTOTAL, CKMB, CKMBINDEX, TROPONINI in the last 168 hours.  HbA1C: HB A1C (BAYER DCA - WAIVED)  Date/Time Value Ref Range Status  12/09/2018 08:41 AM 5.7 <7.0 % Final    Comment:                                          Diabetic Adult            <7.0                                       Healthy Adult        4.3 - 5.7                                                           (  DCCT/NGSP) American Diabetes Association's Summary of Glycemic Recommendations for Adults with Diabetes: Hemoglobin A1c <7.0%. More stringent glycemic goals (A1c <6.0%) may further reduce complications at the cost of increased risk of hypoglycemia.     CBG: Recent Labs  Lab 03/11/19 1136 03/11/19 1615 03/11/19 2013 03/12/19 0313 03/12/19 0751  GLUCAP 182* 178* 207* 218* 251*     Critical care time: 32 minutes     Heber Washburn, MD [place] [facility] Pager: [phone] Cell: [phone] If no response, call [phone]

## 2019-03-12 NOTE — Progress Notes (Signed)
[place] for Lovenox Indication: Covid-PACT trial  Patient Measurements: Height: 5' (152.4 cm) Weight: 233 lb 7.5 oz (105.9 kg) IBW/kg (Calculated) : 45.5  Vital Signs: Temp: 99.2 F (37.3 C) (12/17 0400) Temp Source: Axillary (12/17 0400) BP: 154/83 (12/17 0800) Pulse Rate: 55 (12/17 0800)  Labs: Recent Labs    03/09/19 1827 03/10/19 0545 03/11/19 0500 03/12/19 0500  HGB 12.9 12.1  --   --   HCT 38.0 38.4  --   --   PLT  --  309  --   --   LABPROT  --  13.9 14.5 13.6  INR  --  1.1 1.1 1.1  CREATININE  --  1.02*  --   --     Assessment: 58 yo female intubated in ICU with Covid-19 pneumonia.She has been enrolled in the Covid-PACT trial and was randomized to Lovenox full treatment dose.   SCr 1.02 CBC: Hgb remains low/stable, Plt WNL D-dimer 1.85 (12/14)  Goal of Therapy:  Anti-Xa level 0.6-1 units/ml 4hrs after LMWH dose given Monitor platelets by anticoagulation protocol: Yes   Plan:  -Continue Lovenox 100 mg [place] q12h -Monitor s/sx bleeding -F/u anticoagulation choice post ICU   Gretta Arab PharmD, BCPS Clinical pharmacist phone 7am- 5pm: [phone] 03/12/2019 10:29 AM

## 2019-03-13 LAB — BASIC METABOLIC PANEL
Anion gap: 12 (ref 5–15)
BUN: 56 mg/dL — ABNORMAL HIGH (ref 6–20)
CO2: 24 mmol/L (ref 22–32)
Calcium: 7.9 mg/dL — ABNORMAL LOW (ref 8.9–10.3)
Chloride: 103 mmol/L (ref 98–111)
Creatinine, Ser: 0.75 mg/dL (ref 0.44–1.00)
GFR calc Af Amer: >60 mL/min (ref >60)
GFR calc non Af Amer: >60 mL/min (ref >60)
Glucose, Bld: 247 mg/dL — ABNORMAL HIGH (ref 70–99)
Potassium: 4.8 mmol/L (ref 3.5–5.1)
Sodium: 139 mmol/L (ref 135–145)

## 2019-03-13 LAB — GLUCOSE, CAPILLARY
Glucose-Capillary: 182 mg/dL — ABNORMAL HIGH (ref 70–99)
Glucose-Capillary: 217 mg/dL — ABNORMAL HIGH (ref 70–99)
Glucose-Capillary: 232 mg/dL — ABNORMAL HIGH (ref 70–99)
Glucose-Capillary: 229 mg/dL — ABNORMAL HIGH (ref 70–99)
Glucose-Capillary: 226 mg/dL — ABNORMAL HIGH (ref 70–99)
Glucose-Capillary: 187 mg/dL — ABNORMAL HIGH (ref 70–99)
Glucose-Capillary: 191 mg/dL — ABNORMAL HIGH (ref 70–99)

## 2019-03-13 LAB — PROTIME-INR
INR: 1 (ref 0.8–1.2)
Prothrombin Time: 13 seconds (ref 11.4–15.2)

## 2019-03-13 LAB — FIBRINOGEN: Fibrinogen: 243 mg/dL (ref 210–475)

## 2019-03-13 MED ORDER — FENTANYL CITRATE (PF) 100 MCG/2ML IJ SOLN
50.0000 ug | INTRAMUSCULAR | Status: DC | PRN
  Administered 2019-03-13 – 2019-03-14 (×2): 100 ug via INTRAVENOUS
  Filled 2019-03-13 (×2): qty 2

## 2019-03-13 MED ORDER — HALOPERIDOL LACTATE 5 MG/ML IJ SOLN
1.0000 mg | INTRAMUSCULAR | Status: DC | PRN
  Administered 2019-03-13: 2 mg via INTRAVENOUS
  Filled 2019-03-13: qty 1

## 2019-03-13 NOTE — Progress Notes (Signed)
[facility] Progress Note Patient Name: Deborah Bond DOB: 04/11/1960 MRN: [id number]   Date of Service  03/13/2019  HPI/Events of Note  Ventilator Asynchrony - Currently sedated with a Precedex IV infusion which is not helping.   eICU Interventions  Will order: 1. Fentanyl 50-100 mcg IV Q 1 hour PRN ventilator asynchrony.     Intervention Category Major Interventions: Respiratory failure - evaluation and management  Kaylia Winborne [place] 03/13/2019, 12:14 AM

## 2019-03-13 NOTE — Progress Notes (Signed)
NAME:  Deborah Bond, MRN:  [id number], DOB:  1961/03/09, LOS: 58 ADMISSION DATE:  02/25/2019, CONSULTATION DATE:  12/10 REFERRING MD:  Deborah Bond, CHIEF COMPLAINT:  Dyspnea  Brief History   58 y/o female admitted on 12/8 with dyspnea, cough and hypoxemia due to COVID 19.  Moved to [facility] on 12/9.  Early in the AM on 12/10 she was moved to the ICU for worsening hypoxemia.    Past Medical History  Hyperlipidemia Thyroid disease Depression  Significant Hospital Events   12/9 admission to [facility] 12/10 ICU transfer, intubation 12/16 pressure support triales 12/17 pressure support all day, not awake enough to extubate  Consults:  PCCM  Procedures:  12/10 PCCM>   Significant Diagnostic Tests:    Micro Data:  12/8 POC SARS COV 2 > positive 12/8 blood >   Antimicrobials/COVID Rx:  12/8 ceftriaxone x1 12/8 azithromycin x1 12/8 remdesivir > 12/12 12/8 decadron >  12/9 tocilizumab x1  12/9 CCP x1  Interim history/subjective:   Pressure support all day yesterday   Objective   Blood pressure (!) 160/70, pulse 85, temperature 100.1 F (37.8 C), temperature source Oral, resp. rate (!) 25, height 5' (1.524 m), weight 104.9 kg, SpO2 98 %.    Vent Mode: PRVC FiO2 (%):  [40 %] 40 % Set Rate:  [35 bmp] 35 bmp Vt Set:  [360 mL] 360 mL PEEP:  [5 cmH20] 5 cmH20 Pressure Support:  [10 cmH20] 10 cmH20 Plateau Pressure:  [10 cmH20-17 cmH20] 10 cmH20   Intake/Output Summary (Last 24 hours) at 03/13/2019 2409 Last data filed at 03/13/2019 0800 Gross per 24 hour  Intake 1937.94 ml  Output 2425 ml  Net -487.06 ml   Filed Weights   03/09/19 0500 03/12/19 0421 03/13/19 0417  Weight: 101.3 kg 105.9 kg 104.9 kg    Examination:  General:  In bed on vent HENT: NCAT ETT in place PULM: CTA B, vent supported breathing CV: RRR, no mgr GI: BS+, soft, nontender MSK: normal bulk and tone Neuro: sleepy, opens eyes to pain, no motor movement  12/15 CXR images personally reviewed:  bilateral airspace disease, ETT in place  Resolved Hospital Problem list     Assessment & Plan:  ARDS due to COVID 19 pneumonia : improving oxygenation ddx includes acute pulmonary edema Pressure support ventilation all day today as long as tolerated VAP prevention WUA  Hyperglycemia SSI q4h detemir  Adjust insulin dosing today  Hypothyroid Synthroid  Depression SSRI  Acute metabolic encephalopathy: narcotic related Stop fentanyl Frequent orientation  Tongue bleeding Hold lovenox today SCD  Need for sedation for mechanical ventilation PAD protocol RASS target 0 Stop fentanyl infusion precedex > wean off Haldol prn agitation   Best practice:  Diet: tube feeding, add free water Pain/Anxiety/Delirium protocol (if indicated): yes, as above VAP protocol (if indicated): yes DVT prophylaxis: lovenox GI prophylaxis: famotidine Glucose control: per TRH Mobility: bed rest Code Status: full Family Communication: updated her husband bedside by phone Disposition: remain in ICU  Labs   CBC: Recent Labs  Lab 03/07/19 0445 03/07/19 1702 03/08/19 0633 03/08/19 1825 03/09/19 1827 03/10/19 0545  WBC 10.1  --  8.2  --   --  10.3  NEUTROABS 8.6*  --   --   --   --  8.7*  HGB 10.6* 10.9* 10.8* 11.9* 12.9 12.1  HCT 35.4* 32.0* 36.6 35.0* 38.0 38.4  MCV 98.9  --  98.7  --   --  95.0  PLT 269  --  298  --   --  309    Basic Metabolic Panel: Recent Labs  Lab 03/06/19 1640 03/07/19 1000 03/08/19 0633 03/08/19 1825 03/09/19 0553 03/09/19 1827 03/10/19 0545 03/13/19 0300  NA  --  145 146* 144 145 141 144 139  K  --  4.6 4.8 3.8 3.8 3.6 3.6 4.8  CL  --  110 111  --  105  --  99 103  CO2  --  27 28  --  30  --  36* 24  GLUCOSE  --  240* 208*  --  243*  --  252* 247*  BUN  --  69* 67*  --  72*  --  89* 56*  CREATININE  --  1.05* 0.92  --  0.92  --  1.02* 0.75  CALCIUM  --  8.3* 8.3*  --  7.9*  --  8.6* 7.9*  MG 2.3  --  2.5*  --  2.4  --   --   --   PHOS 3.4   --   --   --   --   --   --   --    GFR: Estimated Creatinine Clearance: 82.8 mL/min (by C-G formula based on SCr of 0.75 mg/dL). Recent Labs  Lab 03/07/19 0445 03/08/19 0633 03/10/19 0545  WBC 10.1 8.2 10.3    Liver Function Tests: Recent Labs  Lab 03/07/19 1000 03/08/19 0633 03/09/19 0553 03/10/19 0545  AST 41 57* 35 26  ALT 45* 72* 61* 56*  ALKPHOS 74 72 59 65  BILITOT 0.4 0.3 0.7 0.7  PROT 5.7* 5.7* 5.4* 6.2*  ALBUMIN 2.6* 2.7* 2.6* 3.0*   No results for input(s): LIPASE, AMYLASE in the last 168 hours. No results for input(s): AMMONIA in the last 168 hours.  ABG    Component Value Date/Time   PHART 7.422 03/09/2019 1827   PCO2ART 57.9 (H) 03/09/2019 1827   PO2ART 112.0 (H) 03/09/2019 1827   HCO3 37.6 (H) 03/09/2019 1827   TCO2 39 (H) 03/09/2019 1827   ACIDBASEDEF 1.0 03/07/2019 1702   O2SAT 98.0 03/09/2019 1827     Coagulation Profile: Recent Labs  Lab 03/09/19 0553 03/10/19 0545 03/11/19 0500 03/12/19 0500 03/13/19 0300  INR 1.1 1.1 1.1 1.1 1.0    Cardiac Enzymes: No results for input(s): CKTOTAL, CKMB, CKMBINDEX, TROPONINI in the last 168 hours.  HbA1C: HB A1C (BAYER DCA - WAIVED)  Date/Time Value Ref Range Status  12/09/2018 08:41 AM 5.7 <7.0 % Final    Comment:                                          Diabetic Adult            <7.0                                       Healthy Adult        4.3 - 5.7                                                           (DCCT/NGSP) American Diabetes Association's  Summary of Glycemic Recommendations for Adults with Diabetes: Hemoglobin A1c <7.0%. More stringent glycemic goals (A1c <6.0%) may further reduce complications at the cost of increased risk of hypoglycemia.     CBG: Recent Labs  Lab 03/12/19 0751 03/12/19 1135 03/12/19 1555 03/13/19 0331 03/13/19 0748  GLUCAP 251* 228* 143* 232* 229*     Critical care time: 31 minutes     Deborah Loretto, MD [place] [facility] Pager: [phone] Cell:  [phone] If no response, call [phone]

## 2019-03-14 ENCOUNTER — Inpatient Hospital Stay (HOSPITAL_COMMUNITY): Payer: BC Managed Care – PPO

## 2019-03-14 LAB — BASIC METABOLIC PANEL
Anion gap: 14 (ref 5–15)
BUN: 58 mg/dL — ABNORMAL HIGH (ref 6–20)
CO2: 24 mmol/L (ref 22–32)
Calcium: 8.6 mg/dL — ABNORMAL LOW (ref 8.9–10.3)
Chloride: 100 mmol/L (ref 98–111)
Creatinine, Ser: 0.76 mg/dL (ref 0.44–1.00)
GFR calc Af Amer: >60 mL/min (ref >60)
GFR calc non Af Amer: >60 mL/min (ref >60)
Glucose, Bld: 206 mg/dL — ABNORMAL HIGH (ref 70–99)
Potassium: 4.7 mmol/L (ref 3.5–5.1)
Sodium: 138 mmol/L (ref 135–145)

## 2019-03-14 LAB — GLUCOSE, CAPILLARY
Glucose-Capillary: 168 mg/dL — ABNORMAL HIGH (ref 70–99)
Glucose-Capillary: 174 mg/dL — ABNORMAL HIGH (ref 70–99)
Glucose-Capillary: 185 mg/dL — ABNORMAL HIGH (ref 70–99)
Glucose-Capillary: 204 mg/dL — ABNORMAL HIGH (ref 70–99)
Glucose-Capillary: 216 mg/dL — ABNORMAL HIGH (ref 70–99)
Glucose-Capillary: 221 mg/dL — ABNORMAL HIGH (ref 70–99)
Glucose-Capillary: 244 mg/dL — ABNORMAL HIGH (ref 70–99)

## 2019-03-14 LAB — POCT I-STAT 7, (LYTES, BLD GAS, ICA,H+H)
Acid-Base Excess: 2 mmol/L (ref 0.0–2.0)
Bicarbonate: 24.7 mmol/L (ref 20.0–28.0)
Calcium, Ion: 1.17 mmol/L (ref 1.15–1.40)
HCT: 42 % (ref 36.0–46.0)
Hemoglobin: 14.3 g/dL (ref 12.0–15.0)
O2 Saturation: 94 %
Patient temperature: 98.6
Potassium: 4.5 mmol/L (ref 3.5–5.1)
Sodium: 137 mmol/L (ref 135–145)
TCO2: 26 mmol/L (ref 22–32)
pCO2 arterial: 32.8 mmHg (ref 32.0–48.0)
pH, Arterial: 7.484 — ABNORMAL HIGH (ref 7.350–7.450)
pO2, Arterial: 65 mmHg — ABNORMAL LOW (ref 83.0–108.0)

## 2019-03-14 LAB — CBC
HCT: 43.4 % (ref 36.0–46.0)
Hemoglobin: 13.8 g/dL (ref 12.0–15.0)
MCH: 29.4 pg (ref 26.0–34.0)
MCHC: 31.8 g/dL (ref 30.0–36.0)
MCV: 92.5 fL (ref 80.0–100.0)
Platelets: 376 10*3/uL (ref 150–400)
RBC: 4.69 MIL/uL (ref 3.87–5.11)
RDW: 14 % (ref 11.5–15.5)
WBC: 29.3 10*3/uL — ABNORMAL HIGH (ref 4.0–10.5)
nRBC: 0 % (ref 0.0–0.2)

## 2019-03-14 LAB — PROTIME-INR
INR: 0.9 (ref 0.8–1.2)
Prothrombin Time: 12.4 seconds (ref 11.4–15.2)

## 2019-03-14 LAB — SODIUM: Sodium: 140 mmol/L (ref 135–145)

## 2019-03-14 LAB — TRIGLYCERIDES: Triglycerides: 228 mg/dL — ABNORMAL HIGH (ref <150)

## 2019-03-14 MED ORDER — ROCURONIUM BROMIDE 50 MG/5ML IV SOLN
50.0000 mg | Freq: Once | INTRAVENOUS | Status: AC
  Administered 2019-03-14: 50 mg via INTRAVENOUS

## 2019-03-14 MED ORDER — HYDRALAZINE HCL 20 MG/ML IJ SOLN
INTRAMUSCULAR | Status: AC
  Administered 2019-03-14: 10 mg via INTRAVENOUS
  Filled 2019-03-14: qty 1

## 2019-03-14 MED ORDER — FENTANYL CITRATE (PF) 100 MCG/2ML IJ SOLN: 50.0000 ug | Freq: Once | INTRAMUSCULAR | Status: DC

## 2019-03-14 MED ORDER — SODIUM CHLORIDE 0.9 % IV SOLN: INTRAVENOUS | Status: DC | PRN

## 2019-03-14 MED ORDER — LEVETIRACETAM IN NACL 1000 MG/100ML IV SOLN
1000.0000 mg | Freq: Two times a day (BID) | INTRAVENOUS | Status: DC
  Administered 2019-03-14 – 2019-03-18 (×9): 1000 mg via INTRAVENOUS
  Filled 2019-03-14 (×9): qty 100

## 2019-03-14 MED ORDER — ACETAMINOPHEN 325 MG PO TABS
650.0000 mg | ORAL_TABLET | Freq: Four times a day (QID) | ORAL | Status: DC | PRN
  Administered 2019-03-16 – 2019-03-19 (×3): 650 mg
  Filled 2019-03-14 (×3): qty 2

## 2019-03-14 MED ORDER — SODIUM CHLORIDE 0.9% FLUSH: 10.0000 mL | INTRAVENOUS | Status: DC | PRN

## 2019-03-14 MED ORDER — PROPOFOL 1000 MG/100ML IV EMUL
5.0000 ug/kg/min | INTRAVENOUS | Status: DC
  Administered 2019-03-14: 25 ug/kg/min via INTRAVENOUS
  Administered 2019-03-14: 20 ug/kg/min via INTRAVENOUS
  Administered 2019-03-15 (×2): 30 ug/kg/min via INTRAVENOUS
  Administered 2019-03-15: 28 ug/kg/min via INTRAVENOUS
  Administered 2019-03-15: 40 ug/kg/min via INTRAVENOUS
  Administered 2019-03-15 – 2019-03-16 (×2): 30 ug/kg/min via INTRAVENOUS
  Administered 2019-03-16 (×2): 40 ug/kg/min via INTRAVENOUS
  Filled 2019-03-14 (×10): qty 100

## 2019-03-14 MED ORDER — SODIUM CHLORIDE 0.9% FLUSH
10.0000 mL | Freq: Two times a day (BID) | INTRAVENOUS | Status: DC
  Administered 2019-03-14: 20 mL
  Administered 2019-03-14: 10 mL
  Administered 2019-03-15: 20 mL
  Administered 2019-03-16: 10 mL
  Administered 2019-03-16: 20 mL
  Administered 2019-03-17: 30 mL
  Administered 2019-03-18 (×2): 10 mL
  Administered 2019-03-19: 5 mL
  Administered 2019-03-19 – 2019-03-20 (×3): 10 mL

## 2019-03-14 MED ORDER — FENTANYL BOLUS VIA INFUSION
50.0000 ug | INTRAVENOUS | Status: DC | PRN
  Administered 2019-03-21: 50 ug via INTRAVENOUS
  Filled 2019-03-14: qty 50

## 2019-03-14 MED ORDER — SODIUM CHLORIDE 3 % IV SOLN
INTRAVENOUS | Status: AC
  Administered 2019-03-14: 50 mL/h via INTRAVENOUS
  Filled 2019-03-14: qty 500

## 2019-03-14 MED ORDER — HYDRALAZINE HCL 20 MG/ML IJ SOLN: 10.0000 mg | INTRAMUSCULAR | Status: DC | PRN

## 2019-03-14 MED ORDER — FENTANYL 2500MCG IN NS 250ML (10MCG/ML) PREMIX INFUSION
50.0000 ug/h | INTRAVENOUS | Status: DC
  Administered 2019-03-14: 100 ug/h via INTRAVENOUS
  Administered 2019-03-15 – 2019-03-16 (×3): 200 ug/h via INTRAVENOUS
  Administered 2019-03-16: 150 ug/h via INTRAVENOUS
  Administered 2019-03-17 – 2019-03-20 (×6): 200 ug/h via INTRAVENOUS
  Filled 2019-03-14 (×12): qty 250

## 2019-03-14 MED ORDER — NICARDIPINE HCL IN NACL 20-0.86 MG/200ML-% IV SOLN
3.0000 mg/h | INTRAVENOUS | Status: DC
  Administered 2019-03-14: 5 mg/h via INTRAVENOUS
  Administered 2019-03-16 (×2): 3 mg/h via INTRAVENOUS
  Filled 2019-03-14 (×3): qty 200

## 2019-03-14 NOTE — Op Note (Signed)
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  Asst. Surgeon: None  Anesthesia: General  Indication: 58 year old female with atypical posterior fossa hemorrhage and secondary obstructive hydrocephalus.  Patient clinically well with declining neurologic function.  Plan for emergent colostomy in hopes of salvage.  Operative note: Patient is sedated with fentanyl and given rocuronium as a paralytic.  Patient's right scalp was prepped and draped sterilely.  Incision made approximately 1cm to the coronal suture in the mid pupillary line.  Twist drill hole was made.  Dura is pierced.  Frontal ventriculostomy catheter is then placed to a depth of about 7-1/2 cm with very brisk return of clear CSF under high pressure.  The catheter was sutured in place.  It was attached to a external drainage system.  There were no apparent complications.

## 2019-03-14 NOTE — Progress Notes (Signed)
NAME:  Deborah Bond, MRN:  [id number], DOB:  05/07/1960, LOS: 11 ADMISSION DATE:  March 13, 2019, CONSULTATION DATE:  12/10 REFERRING MD:  Jarvis Newcomer, CHIEF COMPLAINT:  Dyspnea  Brief History   58 y/o female admitted on 12/8 with dyspnea, cough and hypoxemia due to COVID 19.  Moved to [facility] on 12/9.  Early in the AM on 12/10 she was moved to the ICU for worsening hypoxemia.    Past Medical History  Hyperlipidemia Thyroid disease Depression  Significant Hospital Events   12/9 admission to [facility] 12/10 ICU transfer, intubation 12/16 pressure support triales 12/17 pressure support all day, not awake enough to extubate  Consults:  PCCM  Procedures:  12/10 PCCM>   Significant Diagnostic Tests:    Micro Data:  12/8 POC SARS COV 2 > positive 12/8 blood >   Antimicrobials/COVID Rx:  12/8 ceftriaxone x1 12/8 azithromycin x1 12/8 remdesivir > 12/12 12/8 decadron >  12/9 tocilizumab x1  12/9 CCP x1  Interim history/subjective:   Still not waking up. Poor neuro exam.  Objective   Blood pressure (!) 176/64, pulse 88, temperature 99.6 F (37.6 C), temperature source Axillary, resp. rate (!) 23, height 5' (1.524 m), weight 105.6 kg, SpO2 98 %.    Vent Mode: PSV FiO2 (%):  [40 %] 40 % PEEP:  [5 cmH20] 5 cmH20 Pressure Support:  [10 cmH20] 10 cmH20   Intake/Output Summary (Last 24 hours) at 03/14/2019 0802 Last data filed at 03/14/2019 0600 Gross per 24 hour  Intake 1776.69 ml  Output 725 ml  Net 1051.69 ml   Filed Weights   03/12/19 0421 03/13/19 0417 03/14/19 0439  Weight: 105.9 kg 104.9 kg 105.6 kg    Examination:  GEN: middle aged woman on vent HEENT: oral bleeding noted, no obvious source on limited mouth and nose exam.  No bloody secretions on ETT suctioning CV: RRR, ext warm PULM: Clear, no accessory muscle use GI: Soft, hypoactive BS EXT: trace edema NEURO: R gaze preferance, eyes will not cross midline, GCS3, + cough, + gag, triggers vent, has corneals, eyes  pinpoint but reactive PSYCH: cannot assess SKIN: No rashes  No new imaging  BMP benign WBC this am markedly elevated from 4 days ago likely related to steroids  Resolved Hospital Problem list     Assessment & Plan:  ARDS due to COVID 19 pneumonia : improving oxygenation ddx includes acute pulmonary edema Pressure support ventilation all day today as long as tolerated VAP prevention WUA  Hyperglycemia-  A bit better on current regimen, continue SSI q4h detemir   Hypothyroid Synthroid  Depression SSRI  Encephalopathy Neuro exam concerning Stat CT head Continue to hold sedation  Tongue bleeding Continue to hold lovenox for now SCD   Best practice:  Diet: tube feeding, add free water Pain/Anxiety/Delirium protocol (if indicated): yes, as above VAP protocol (if indicated): yes DVT prophylaxis: on hold GI prophylaxis: famotidine Glucose control: see above Mobility: bed rest Code Status: full Family Communication: will call Disposition: remain in ICU  Labs   CBC: Recent Labs  Lab 03/08/19 0633 03/08/19 1825 03/09/19 1827 03/10/19 0545 03/14/19 0430 03/14/19 0438  WBC 8.2  --   --  10.3  --  29.3*  NEUTROABS  --   --   --  8.7*  --   --   HGB 10.8* 11.9* 12.9 12.1 14.3 13.8  HCT 36.6 35.0* 38.0 38.4 42.0 43.4  MCV 98.7  --   --  95.0  --  92.5  PLT 298  --   --  309  --  829    Basic Metabolic Panel: Recent Labs  Lab 03/08/19 0633 03/09/19 0553 03/09/19 1827 03/10/19 0545 03/13/19 0300 03/14/19 0430 03/14/19 0438  NA 146* 145 141 144 139 137 138  K 4.8 3.8 3.6 3.6 4.8 4.5 4.7  CL 111 105  --  99 103  --  100  CO2 28 30  --  36* 24  --  24  GLUCOSE 208* 243*  --  252* 247*  --  206*  BUN 67* 72*  --  89* 56*  --  58*  CREATININE 0.92 0.92  --  1.02* 0.75  --  0.76  CALCIUM 8.3* 7.9*  --  8.6* 7.9*  --  8.6*  MG 2.5* 2.4  --   --   --   --   --    GFR: Estimated Creatinine Clearance: 83.1 mL/min (by C-G formula based on SCr of 0.76  mg/dL). Recent Labs  Lab 03/08/19 0633 03/10/19 0545 03/14/19 0438  WBC 8.2 10.3 29.3*    Liver Function Tests: Recent Labs  Lab 03/07/19 1000 03/08/19 0633 03/09/19 0553 03/10/19 0545  AST 41 57* 35 26  ALT 45* 72* 61* 56*  ALKPHOS 74 72 59 65  BILITOT 0.4 0.3 0.7 0.7  PROT 5.7* 5.7* 5.4* 6.2*  ALBUMIN 2.6* 2.7* 2.6* 3.0*   No results for input(s): LIPASE, AMYLASE in the last 168 hours. No results for input(s): AMMONIA in the last 168 hours.  ABG    Component Value Date/Time   PHART 7.484 (H) 03/14/2019 0430   PCO2ART 32.8 03/14/2019 0430   PO2ART 65.0 (L) 03/14/2019 0430   HCO3 24.7 03/14/2019 0430   TCO2 26 03/14/2019 0430   ACIDBASEDEF 1.0 03/07/2019 1702   O2SAT 94.0 03/14/2019 0430     Coagulation Profile: Recent Labs  Lab 03/10/19 0545 03/11/19 0500 03/12/19 0500 03/13/19 0300 03/14/19 0438  INR 1.1 1.1 1.1 1.0 0.9    Cardiac Enzymes: No results for input(s): CKTOTAL, CKMB, CKMBINDEX, TROPONINI in the last 168 hours.  HbA1C: HB A1C (BAYER DCA - WAIVED)  Date/Time Value Ref Range Status  12/09/2018 08:41 AM 5.7 <7.0 % Final    Comment:                                          Diabetic Adult            <7.0                                       Healthy Adult        4.3 - 5.7                                                           (DCCT/NGSP) American Diabetes Association's Summary of Glycemic Recommendations for Adults with Diabetes: Hemoglobin A1c <7.0%. More stringent glycemic goals (A1c <6.0%) may further reduce complications at the cost of increased risk of hypoglycemia.     CBG: Recent Labs  Lab 03/13/19 1512 03/13/19 1944 03/14/19 0036 03/14/19 0346 03/14/19 9371  GLUCAP 187* 191* 244* 216* 174*       The patient is critically ill with multiple organ systems failure and requires high complexity decision making for assessment and support, frequent evaluation and titration of therapies, application of advanced monitoring  technologies and extensive interpretation of multiple databases. Critical Care Time devoted to patient care services described in this note independent of APP/resident time (if applicable)  is 40 minutes.   Myrla Halstedan Shaquala Broeker MD [facility] 03/14/2019 11:15 AM Personal pager: [phone] If unanswered, please page CCM On-call: #[phone]

## 2019-03-14 NOTE — Progress Notes (Signed)
RT note:T and RN transported patient to CT and Back to ICU. Vital signs stable through out.

## 2019-03-14 NOTE — Consult Note (Signed)
Reason for Consult: Hydrocephalus Referring Physician: Critical care  Deborah Bond is an 58 y.o. female.  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  Patient has been sedated and ventilated for the past week.  Over the last 24 hours she has been progressively noted to be less responsive.  Head CT was obtained today which demonstrates evidence of a posterior fossa hemorrhage likely in the subdural space on the left side with significant compression of the left cerebellar hemisphere and effacement of her fourth ventricle.  Patient has evidence of obstructive hydrocephalus.  There does not appear to be significant intraparenchymal hemorrhage.  I cannot fully assess the patency of her venous sinuses.  The left cerebellar hemisphere is very edematous and I cannot fully assess for possible infarction.  Past Medical History:  Diagnosis Date  . Depression   . Hyperlipidemia   . Thyroid disease     Past Surgical History:  Procedure Laterality Date  . ABDOMINAL HYSTERECTOMY    . BACK SURGERY    . THYROID SURGERY      Family History  Problem Relation Age of Onset  . Cancer Mother        lymphoma and lung  . Cancer Father        stomach/pancreatic     Social History:  reports that she has quit smoking. She has never used smokeless tobacco. She reports that she does not drink alcohol or use drugs.  Allergies: No Known Allergies  Medications: I have reviewed the patient's current medications.  Results for orders placed or performed during the hospital encounter of 03/02/2019 (from the past 48 hour(s))  Glucose, capillary     Status: Abnormal   Collection Time: 03/12/19  3:55 PM  Result Value Ref Range   Glucose-Capillary 143 (H) 70 - 99 mg/dL  Glucose, capillary     Status: Abnormal   Collection Time:  03/12/19  7:56 PM  Result Value Ref Range   Glucose-Capillary 182 (H) 70 - 99 mg/dL  Glucose, capillary     Status: Abnormal   Collection Time: 03/12/19 10:59 PM  Result Value Ref Range   Glucose-Capillary 217 (H) 70 - 99 mg/dL  Protime-INR     Status: None   Collection Time: 03/13/19  3:00 AM  Result Value Ref Range   Prothrombin Time 13.0 11.4 - 15.2 seconds   INR 1.0 0.8 - 1.2    Comment: (NOTE) INR goal varies based on device and disease states. Performed at [facility], [address] [place], [place] [address]   Fibrinogen     Status: None   Collection Time: 03/13/19  3:00 AM  Result Value Ref Range   Fibrinogen 243 210 - 475 mg/dL    Comment: Performed at [facility], [address] [place], [place] [address]  Basic metabolic panel in am     Status: Abnormal   Collection Time: 03/13/19  3:00 AM  Result Value Ref Range   Sodium 139 135 - 145 mmol/L   Potassium 4.8 3.5 - 5.1 mmol/L   Chloride 103 98 - 111 mmol/L   CO2 24 22 - 32 mmol/L   Glucose, Bld 247 (H) 70 - 99 mg/dL   BUN 56 (H) 6 - 20 mg/dL   Creatinine, Ser [id number] 0.44 - 1.00 mg/dL   Calcium 7.9 (L) 8.9 - 10.3 mg/dL  GFR calc non Af Amer >60 >60 mL/min   GFR calc Af Amer >60 >60 mL/min   Anion gap 12 5 - 15    Comment: Performed at [facility], [place] [address], [place], [place] [address]  Glucose, capillary     Status: Abnormal   Collection Time: 03/13/19  3:31 AM  Result Value Ref Range   Glucose-Capillary 232 (H) 70 - 99 mg/dL  Glucose, capillary     Status: Abnormal   Collection Time: 03/13/19  7:48 AM  Result Value Ref Range   Glucose-Capillary 229 (H) 70 - 99 mg/dL  Glucose, capillary     Status: Abnormal   Collection Time: 03/13/19 12:40 PM  Result Value Ref Range   Glucose-Capillary 226 (H) 70 - 99 mg/dL  Glucose, capillary     Status: Abnormal   Collection Time: 03/13/19  3:12 PM  Result Value Ref Range   Glucose-Capillary 187 (H) 70 -  99 mg/dL  Glucose, capillary     Status: Abnormal   Collection Time: 03/13/19  7:44 PM  Result Value Ref Range   Glucose-Capillary 191 (H) 70 - 99 mg/dL  Glucose, capillary     Status: Abnormal   Collection Time: 03/14/19 12:36 AM  Result Value Ref Range   Glucose-Capillary 244 (H) 70 - 99 mg/dL  Glucose, capillary     Status: Abnormal   Collection Time: 03/14/19  3:46 AM  Result Value Ref Range   Glucose-Capillary 216 (H) 70 - 99 mg/dL  I-STAT 7, (LYTES, BLD GAS, ICA, H+H)     Status: Abnormal   Collection Time: 03/14/19  4:30 AM  Result Value Ref Range   pH, Arterial 7.484 (H) 7.350 - 7.450   pCO2 arterial 32.8 32.0 - 48.0 mmHg   pO2, Arterial 65.0 (L) 83.0 - 108.0 mmHg   Bicarbonate 24.7 20.0 - 28.0 mmol/L   TCO2 26 22 - 32 mmol/L   O2 Saturation 94.0 %   Acid-Base Excess 2.0 0.0 - 2.0 mmol/L   Sodium 137 135 - 145 mmol/L   Potassium 4.5 3.5 - 5.1 mmol/L   Calcium, Ion 1.17 1.15 - 1.40 mmol/L   HCT 42.0 36.0 - 46.0 %   Hemoglobin 14.3 12.0 - 15.0 g/dL   Patient temperature 98.6 F    Collection site RADIAL, ALLEN'S TEST ACCEPTABLE    Drawn by RT    Sample type ARTERIAL   Protime-INR     Status: None   Collection Time: 03/14/19  4:38 AM  Result Value Ref Range   Prothrombin Time 12.4 11.4 - 15.2 seconds   INR 0.9 0.8 - 1.2    Comment: (NOTE) INR goal varies based on device and disease states. Performed at [facility], [place] [address] [place], [place] [address]   Basic metabolic panel in am     Status: Abnormal   Collection Time: 03/14/19  4:38 AM  Result Value Ref Range   Sodium 138 135 - 145 mmol/L   Potassium 4.7 3.5 - 5.1 mmol/L   Chloride 100 98 - 111 mmol/L   CO2 24 22 - 32 mmol/L   Glucose, Bld 206 (H) 70 - 99 mg/dL   BUN 58 (H) 6 - 20 mg/dL   Creatinine, Ser 0.76 0.44 - 1.00 mg/dL   Calcium 8.6 (L) 8.9 - 10.3 mg/dL   GFR calc non Af Amer >60 >60 mL/min   GFR calc Af Amer >60 >60 mL/min   Anion gap 14 5 - 15  Comment: Performed at  [facility], [address], [place], [place] [address]  CBC     Status: Abnormal   Collection Time: 03/14/19  4:38 AM  Result Value Ref Range   WBC 29.3 (H) 4.0 - 10.5 K/uL   RBC 4.69 3.87 - 5.11 MIL/uL   Hemoglobin 13.8 12.0 - 15.0 g/dL   HCT 71.2 45.8 - 09.9 %   MCV 92.5 80.0 - 100.0 fL   MCH 29.4 26.0 - 34.0 pg   MCHC 31.8 30.0 - 36.0 g/dL   RDW 83.3 82.5 - 05.3 %   Platelets 376 150 - 400 K/uL   nRBC 0.0 0.0 - 0.2 %    Comment: Performed at [facility], [address] [place], [place] [address]  Glucose, capillary     Status: Abnormal   Collection Time: 03/14/19  7:38 AM  Result Value Ref Range   Glucose-Capillary 174 (H) 70 - 99 mg/dL  Sodium     Status: None   Collection Time: 03/14/19 11:41 AM  Result Value Ref Range   Sodium 140 135 - 145 mmol/L    Comment: Performed at [facility], [address], [place], [place] [address]  Glucose, capillary     Status: Abnormal   Collection Time: 03/14/19 12:08 PM  Result Value Ref Range   Glucose-Capillary 221 (H) 70 - 99 mg/dL    CT HEAD WO CONTRAST  Result Date: 03/14/2019 CLINICAL DATA:  Altered mental status.  COVID-19 infection. EXAM: CT HEAD WITHOUT CONTRAST TECHNIQUE: Contiguous axial images were obtained from the base of the skull through the vertex without intravenous contrast. COMPARISON:  None. FINDINGS: Brain: Acute predominantly subdural hematoma in the left posterior fossa measuring up to 3.0 cm. Suspected small intraparenchymal hemorrhage within the left cerebellum as well. Resultant mass effect on the cerebellum with effacement of the fourth ventricle and brainstem. Patchy hypodensity in both occipital lobes. Early hydrocephalus with enlargement of the lateral ventricle temporal and occipital horns. Vascular: No hyperdense vessel or unexpected calcification. Skull: Normal. Negative for fracture or focal lesion. Sinuses/Orbits: Partial opacification of the  bilateral sphenoid sinuses and mastoid air cells. Other: Partially visualized endotracheal and enteric tubes. IMPRESSION: 1. Acute predominantly subdural hematoma in the left posterior fossa measuring up to 3.0 cm. Suspected small intraparenchymal hemorrhage within the left cerebellum as well. Effacement of the fourth ventricle and brainstem with early hydrocephalus. Emergent neurosurgery consultation recommended. 2. Patchy hypodensity in both occipital lobes, concerning for infarct. Given COVID-19 infection, constellation of findings may be related to underlying venous thrombosis. Follow-up MRI and CTV suggested after decompression. Critical Value/emergent results were called by telephone at the time of interpretation on 03/14/2019 at 11:30 am to [facility] , who verbally acknowledged these results. Electronically Signed   By: Obie Dredge M.D.   On: 03/14/2019 11:40    Review of systems not obtained due to patient factors. Blood pressure (!) 150/68, pulse (!) 136, temperature 100 F (37.8 C), temperature source Axillary, resp. rate (!) 28, height 5' (1.524 m), weight 105.6 kg, SpO2 92 %. Patient is intubated.  She opens her eyes to noxious stimuli.  She appears minimally aware.  She withdraws all 4 extremities.  She does not follow commands.  Her pupils are midposition and sluggish.  Her gaze is conjugate.  Corneal reflexes are present.  Cough and gag reflex is present.  Assessment/Plan: Left posterior fossa subdural hemorrhage of unclear etiology.  Given the patient's critical illness and Covid  status I am not concerned that she may have suffered thrombosis of a cerebral venous sinus with secondary hemorrhage.  Unfortunately patient has an indwelling spinal cord stimulator and cannot safely have an MRI scan for further evaluation.  The patient's most pressing problem at this time is obstructive hydrocephalus.  I discussed situation with patient's husband.  I recommended emergent placement of  a right frontal ventriculostomy.  We discussed the risks and benefits and he agrees to proceed.  Prior treatment options the hemorrhage is not so large at present but I would recommend operative evacuation.  Patient level follow-up head CT scan tomorrow.  If there is still concern for cerebral venous sinus thrombosis we may consider therapeutic anticoagulation.  Sherilyn Cooter A Tayten Heber 03/14/2019, 2:13 PM

## 2019-03-14 NOTE — Progress Notes (Signed)
Arterial blood gas drawn while patient on on Pressure Support of 10cm, Peep 5cm and 40% FiO2.

## 2019-03-14 NOTE — Progress Notes (Addendum)
Brief Progress Note  Pt to 4N25 via [facility]. NSGY placed EVD at bedside.  Requiring nicardipine for SBP <140.  Place Aline for continuous IBP monitoring.  Vent desynchrony /p paralytic for EVD placement. Add fentanyl infusion to current prn order.  CXR.  Ongoing monitoring.   Francine Graven, MSN, AGACNP  [facility]

## 2019-03-14 NOTE — Progress Notes (Signed)
CT head looks like posterior bleed Concern for developing hydrocephalus No lovenox since yesterday, no need to reverse Hold AC Nicardipine gtt titrated to SBP < 140 Hypertonic bolus x 1 Keppra x 1 Updated husband NSGY Dr. Annette Stable aware, wants call when patient at [facility] so he can place drain.  Erskine Emery MD PCCM

## 2019-03-14 NOTE — Procedures (Signed)
Arterial Catheter Insertion Procedure Note Deborah Bond [id number] 10/03/1960  Procedure: Insertion of Arterial Catheter  Indications: Blood pressure monitoring and Frequent blood sampling  Procedure Details Consent: Unable to obtain consent because of emergent medical necessity. Time Out: Verified patient identification, verified procedure, site/side was marked, verified correct patient position, special equipment/implants available, medications/allergies/relevent history reviewed, required imaging and test results available.  Performed  Maximum sterile technique was used including antiseptics, cap, gloves, gown, hand hygiene, mask and sheet. Skin prep: Chlorhexidine;  20 gauge catheter was inserted into left radial artery using the Seldinger technique. ULTRASOUND GUIDANCE USED: NO Evaluation Blood flow good; BP tracing good. Complications: No apparent complications.   Ander Purpura 03/14/2019

## 2019-03-15 ENCOUNTER — Encounter (HOSPITAL_COMMUNITY): Payer: Self-pay | Admitting: Family Medicine

## 2019-03-15 ENCOUNTER — Inpatient Hospital Stay (HOSPITAL_COMMUNITY): Payer: BC Managed Care – PPO

## 2019-03-15 DIAGNOSIS — G911 Obstructive hydrocephalus: Secondary | ICD-10-CM

## 2019-03-15 DIAGNOSIS — I614 Nontraumatic intracerebral hemorrhage in cerebellum: Secondary | ICD-10-CM

## 2019-03-15 DIAGNOSIS — J80 Acute respiratory distress syndrome: Secondary | ICD-10-CM

## 2019-03-15 LAB — BASIC METABOLIC PANEL
Anion gap: 9 (ref 5–15)
BUN: 65 mg/dL — ABNORMAL HIGH (ref 6–20)
CO2: 26 mmol/L (ref 22–32)
Calcium: 8.3 mg/dL — ABNORMAL LOW (ref 8.9–10.3)
Chloride: 107 mmol/L (ref 98–111)
Creatinine, Ser: 1.09 mg/dL — ABNORMAL HIGH (ref 0.44–1.00)
GFR calc Af Amer: >60 mL/min (ref >60)
GFR calc non Af Amer: 56 mL/min — ABNORMAL LOW (ref >60)
Glucose, Bld: 166 mg/dL — ABNORMAL HIGH (ref 70–99)
Potassium: 5 mmol/L (ref 3.5–5.1)
Sodium: 142 mmol/L (ref 135–145)

## 2019-03-15 LAB — GLUCOSE, CAPILLARY
Glucose-Capillary: 182 mg/dL — ABNORMAL HIGH (ref 70–99)
Glucose-Capillary: 165 mg/dL — ABNORMAL HIGH (ref 70–99)
Glucose-Capillary: 197 mg/dL — ABNORMAL HIGH (ref 70–99)
Glucose-Capillary: 169 mg/dL — ABNORMAL HIGH (ref 70–99)
Glucose-Capillary: 189 mg/dL — ABNORMAL HIGH (ref 70–99)

## 2019-03-15 LAB — CBC
HCT: 41.9 % (ref 36.0–46.0)
Hemoglobin: 13.1 g/dL (ref 12.0–15.0)
MCH: 30.3 pg (ref 26.0–34.0)
MCHC: 31.3 g/dL (ref 30.0–36.0)
MCV: 96.8 fL (ref 80.0–100.0)
Platelets: 226 10*3/uL (ref 150–400)
RBC: 4.33 MIL/uL (ref 3.87–5.11)
RDW: 14.2 % (ref 11.5–15.5)
WBC: 18 10*3/uL — ABNORMAL HIGH (ref 4.0–10.5)
nRBC: 0 % (ref 0.0–0.2)

## 2019-03-15 LAB — MAGNESIUM: Magnesium: 3.4 mg/dL — ABNORMAL HIGH (ref 1.7–2.4)

## 2019-03-15 LAB — PROTIME-INR
INR: 1 (ref 0.8–1.2)
Prothrombin Time: 13.2 seconds (ref 11.4–15.2)

## 2019-03-15 MED ORDER — INSULIN ASPART 100 UNIT/ML ~~LOC~~ SOLN
4.0000 [IU] | SUBCUTANEOUS | Status: DC
  Administered 2019-03-15 – 2019-03-16 (×9): 4 [IU] via SUBCUTANEOUS

## 2019-03-15 MED ORDER — IOHEXOL 350 MG/ML SOLN
75.0000 mL | Freq: Once | INTRAVENOUS | Status: AC | PRN
  Administered 2019-03-15: 75 mL via INTRAVENOUS

## 2019-03-15 MED ORDER — INSULIN DETEMIR 100 UNIT/ML ~~LOC~~ SOLN
10.0000 [IU] | Freq: Two times a day (BID) | SUBCUTANEOUS | Status: DC
  Administered 2019-03-15 – 2019-03-16 (×3): 10 [IU] via SUBCUTANEOUS
  Filled 2019-03-15 (×4): qty 0.1

## 2019-03-15 NOTE — Progress Notes (Signed)
Assisted tele visit to patient with family members.  Munachimso Rigdon Ann, RN  

## 2019-03-15 NOTE — Progress Notes (Signed)
NAME:  Claudeen Leason, MRN:  [id number], DOB:  [birth year], LOS: 12 ADMISSION DATE:  03/11/2019, CONSULTATION DATE:  12/10 REFERRING MD:  Jarvis Newcomer, CHIEF COMPLAINT:  Dyspnea  Brief History   58 y/o female admitted on 12/8 with dyspnea, cough and hypoxemia due to COVID 19.  Moved to [facility] on 12/9.  Early in the AM on 12/10 she was moved to the ICU for worsening hypoxemia.    Past Medical History  Hyperlipidemia Thyroid disease Depression  Significant Hospital Events   12/9 admission to [facility] 12/10 ICU transfer, intubation 12/16 pressure support triales 12/17 pressure support all day, not awake enough to extubate 12/19 transfer to [facility] 4N for left posterior fossa hemorrhage with obstructive hydrocephalus, NSGY eval and EVD   Consults:  PCCM NSGY  Procedures:  12/10 Arterial line>> 12/19 Right frontal ventriculostomy>>  Significant Diagnostic Tests:  12/19 CT Head IMPRESSION: 1. Acute predominantly subdural hematoma in the left posterior fossa measuring up to 3.0 cm. Suspected small intraparenchymal hemorrhage within the left cerebellum as well. Effacement of the fourth ventricle and brainstem with early hydrocephalus. Emergent neurosurgery consultation recommended. 2. Patchy hypodensity in both occipital lobes, concerning for infarct. Given COVID-19 infection, constellation of findings may be related to underlying venous thrombosis. Follow-up MRI and CTV suggested after decompression.  12/20 CT Head  IMPRESSION: 1. Interval placement of right frontal approach ventriculostomy with tip positioned within the right lateral ventricle near the foramen of Monro. Previously seen hydrocephalus has resolved, with interval decompression of the ventricular system. 2. No significant interval change in size and morphology of acute left posterior fossa hemorrhage with similar regional mass effect. 3. No other new acute intracranial abnormality.  Micro Data:  12/8 POC SARS COV 2 >  positive 12/8 blood >Negative  MRSA surveillance negative    Antimicrobials/COVID Rx:  12/8 ceftriaxone x1 12/8 azithromycin x1 12/8 remdesivir > 12/12 12/8 decadron >12/17  12/9 tocilizumab x1  12/9 CCP x1  Interim history/subjective:  S/P ventriculostomy for obstructive hydrocephalus secondary to posterior fossa hemorrhage. On 80% FiO2, 10 PEEP.    Objective   Blood pressure 130/79, pulse 98, temperature 99.3 F (37.4 C), temperature source Axillary, resp. rate 10, height 5' (1.524 m), weight 105.6 kg, SpO2 93 %.    Vent Mode: PRVC FiO2 (%):  [40 %-100 %] 80 % Set Rate:  [35 bmp] 35 bmp Vt Set:  [360 mL] 360 mL PEEP:  [5 cmH20-10 cmH20] 10 cmH20 Pressure Support:  [10 cmH20] 10 cmH20 Plateau Pressure:  [25 cmH20] 25 cmH20   Intake/Output Summary (Last 24 hours) at 03/15/2019 0817 Last data filed at 03/15/2019 0800 Gross per 24 hour  Intake 2088.63 ml  Output 1598 ml  Net 490.63 ml   Filed Weights   03/12/19 0421 03/13/19 0417 03/14/19 0439  Weight: 105.9 kg 104.9 kg 105.6 kg    Examination:  General: Obese, well developed critically ill appearing adult female, intubated, sedated HENT: R Frontal EVD with insertion site hematoma, dressing in place CDI. PERRL-pinpoint. sluggish. Moist mucus membranes Neck: No JVD. Trachea midline.  CV: RRR. S1S2. No MRG. +2 distal pulses Lungs: BBS coarse rhonchi throughout, tachypneic, symmetrical. Full vent support ABD: +BS x4. SNT/ND. No masses, guarding or rigidity GU: Foley EXT: 1+ edema to arms and legs  Skin: PWD. In tact. No rashes or lesions Neuro: unresponsive. Does not withdraw or blink to threat. + gag    Resolved Hospital Problem list     Assessment & Plan:  ARDS due to COVID  19 pneumonia with respiratory failure: -Continue ventilator support to prevent eminent deterioration and further organ dysfunction from hypoxemia and hypercarbia per ARDS protocol.   -Patient is at risk for sudden hypoxia, barotrauma and  hemodynamic compromise.   -Maintain SpO2 greater than or equal to 88%. -Head of bed elevated 30 degrees. -Plateau pressures less than 30 cm H20.  -Follow chest x-ray, ABG.   -SAT/SBT as tolerated  -Bronchial hygiene. -RT/bronchodilator protocol. -Remdesivir and decadron complete  -Continue Zinc, vitamin C    Hyperglycemia-  Improving but requiring relatively high doses  -Increase Scheduled novolog to 4U Q4H -Continue resistant SSI q4h -Increase detemir to 10U BID  Hypothyroid -Synthroid  Depression -SSRI  Encephalopathy 2/2 posterior fossa hemorrhage S/P R frontal ventriculostomy for obstructive hydrocephalus S/P hypertonic saline -Neuroprotective measures: Maintain euthermia, euglycemia, eunatremia, normoxia as able, pCO2 35-40 as ble, nutrition and bowel regimen, seizure precautions, head of bed elevated  -Per neurosurgery -Serial neuro exams -Per neurosurgery may consider therapeutic anticoagulation- will discuss -AED -Titrate cardene for SBP <140 prn -IBP monitoring   Tongue bleeding Continue to hold lovenox for now. See above  SCD  Morbid obesity BMI 45  Best practice:  Diet: tube feeding Pain/Anxiety/Delirium protocol (if indicated): yes, as above VAP protocol (if indicated): yes DVT prophylaxis: SCDs, chemo on hold, see above  GI prophylaxis: famotidine Glucose control: see above Mobility: bed rest Code Status: full Family Communication: will update Disposition: remain in ICU  Labs   CBC: Recent Labs  Lab 03/09/19 1827 03/10/19 0545 03/14/19 0430 03/14/19 0438 03/15/19 0611  WBC  --  10.3  --  29.3* 18.0*  NEUTROABS  --  8.7*  --   --   --   HGB 12.9 12.1 14.3 13.8 13.1  HCT 38.0 38.4 42.0 43.4 41.9  MCV  --  95.0  --  92.5 96.8  PLT  --  309  --  376 226    Basic Metabolic Panel: Recent Labs  Lab 03/09/19 0553 03/10/19 0545 03/13/19 0300 03/14/19 0430 03/14/19 0438 03/14/19 1141 03/15/19 0611  NA 145 144 139 137 138 140 142  K  3.8 3.6 4.8 4.5 4.7  --  5.0  CL 105 99 103  --  100  --  107  CO2 30 36* 24  --  24  --  26  GLUCOSE 243* 252* 247*  --  206*  --  166*  BUN 72* 89* 56*  --  58*  --  65*  CREATININE 0.92 1.02* 0.75  --  0.76  --  1.09*  CALCIUM 7.9* 8.6* 7.9*  --  8.6*  --  8.3*  MG 2.4  --   --   --   --   --  3.4*   GFR: Estimated Creatinine Clearance: 61 mL/min (A) (by C-G formula based on SCr of 1.09 mg/dL (H)). Recent Labs  Lab 03/10/19 0545 03/14/19 0438 03/15/19 0611  WBC 10.3 29.3* 18.0*    Liver Function Tests: Recent Labs  Lab 03/09/19 0553 03/10/19 0545  AST 35 26  ALT 61* 56*  ALKPHOS 59 65  BILITOT 0.7 0.7  PROT 5.4* 6.2*  ALBUMIN 2.6* 3.0*   No results for input(s): LIPASE, AMYLASE in the last 168 hours. No results for input(s): AMMONIA in the last 168 hours.  ABG    Component Value Date/Time   PHART 7.484 (H) 03/14/2019 0430   PCO2ART 32.8 03/14/2019 0430   PO2ART 65.0 (L) 03/14/2019 0430   HCO3 24.7 03/14/2019 0430  TCO2 26 03/14/2019 0430   ACIDBASEDEF 1.0 03/07/2019 1702   O2SAT 94.0 03/14/2019 0430     Coagulation Profile: Recent Labs  Lab 03/11/19 0500 03/12/19 0500 03/13/19 0300 03/14/19 0438 03/15/19 0611  INR 1.1 1.1 1.0 0.9 1.0    Cardiac Enzymes: No results for input(s): CKTOTAL, CKMB, CKMBINDEX, TROPONINI in the last 168 hours.  HbA1C: HB A1C (BAYER DCA - WAIVED)  Date/Time Value Ref Range Status  12/09/2018 08:41 AM 5.7 <7.0 % Final    Comment:                                          Diabetic Adult            <7.0                                       Healthy Adult        4.3 - 5.7                                                           (DCCT/NGSP) American Diabetes Association's Summary of Glycemic Recommendations for Adults with Diabetes: Hemoglobin A1c <7.0%. More stringent glycemic goals (A1c <6.0%) may further reduce complications at the cost of increased risk of hypoglycemia.     CBG: Recent Labs  Lab 03/14/19 0738  03/14/19 1208 03/14/19 1642 03/14/19 2040 03/14/19 2304  GLUCAP 174* 221* 168* 204* 185*      The patient is critically ill with ARDS/respiratory failure, posterior fossa hemorrhage. . She requires ICU for high complexity decision making, titration of high alert medications, ventilator management, titration of oxygen and interpretation of advanced monitoring.    I personally spent 45 minutes providing critical care services including personally reviewing test results, discussing care with nursing staff/other physicians and completing orders pertaining to this patient.  Time was exclusive to the patient and does not include time spent teaching or in procedures.  Voice recognition software was used in the production of this record.  Errors in interpretation may have been inadvertently missed during review.  Francine Graven, MSN, AGACNP  [facility]

## 2019-03-15 NOTE — Progress Notes (Signed)
RT NOTES: Transported patient to CT and back to room 4N25 without incident.

## 2019-03-15 NOTE — Progress Notes (Signed)
  NEUROSURGERY PROGRESS NOTE   History reviewed. No issues overnight after EVD placement.  EXAM:  BP 130/79   Pulse (!) 106   Temp 99.3 F (37.4 C) (Axillary)   Resp (!) 44   Ht 5' (1.524 m)   Wt 105.6 kg   SpO2 94%   BMI 45.47 kg/m   Sedated on propofol/fentanyl No eye opening Pupils pinpoint No motor responses to pain EVD in place, draining briskly at 15cmH20 above EAM  IMAGING: CTH reviewed, good decompression of ventricular system. Unchanged pos fossa hematoma with mass effect   IMPRESSION:  58 y.o. female COVID (+) with unusual pos fossa hemorrhage and associated HCP treated well with EVD drainage. Etiology of hemorrhage remains unclear, ? Dural sinus thrombosis  PLAN: - Will plan on CTV today, can't have MRI due to SCS - Cont EVD drainage at 15cm

## 2019-03-15 NOTE — Progress Notes (Signed)
Assisted tele visit to patient with husband.  Brode Sculley Ann, RN  

## 2019-03-15 NOTE — Progress Notes (Signed)
Patient transported to CT and back to 4N25 without complications.  

## 2019-03-16 ENCOUNTER — Inpatient Hospital Stay (HOSPITAL_COMMUNITY): Payer: BC Managed Care – PPO

## 2019-03-16 DIAGNOSIS — I619 Nontraumatic intracerebral hemorrhage, unspecified: Secondary | ICD-10-CM

## 2019-03-16 DIAGNOSIS — U071 COVID-19: Secondary | ICD-10-CM

## 2019-03-16 DIAGNOSIS — J189 Pneumonia, unspecified organism: Secondary | ICD-10-CM

## 2019-03-16 DIAGNOSIS — Z9911 Dependence on respirator [ventilator] status: Secondary | ICD-10-CM

## 2019-03-16 DIAGNOSIS — I959 Hypotension, unspecified: Secondary | ICD-10-CM

## 2019-03-16 LAB — CBC
HCT: 36.7 % (ref 36.0–46.0)
Hemoglobin: 11.2 g/dL — ABNORMAL LOW (ref 12.0–15.0)
MCH: 30.1 pg (ref 26.0–34.0)
MCHC: 30.5 g/dL (ref 30.0–36.0)
MCV: 98.7 fL (ref 80.0–100.0)
Platelets: 157 10*3/uL (ref 150–400)
RBC: 3.72 MIL/uL — ABNORMAL LOW (ref 3.87–5.11)
RDW: 14 % (ref 11.5–15.5)
WBC: 10.7 10*3/uL — ABNORMAL HIGH (ref 4.0–10.5)
nRBC: 0 % (ref 0.0–0.2)

## 2019-03-16 LAB — POCT I-STAT 7, (LYTES, BLD GAS, ICA,H+H)
Acid-Base Excess: 5 mmol/L — ABNORMAL HIGH (ref 0.0–2.0)
Acid-base deficit: 1 mmol/L (ref 0.0–2.0)
Acid-base deficit: 4 mmol/L — ABNORMAL HIGH (ref 0.0–2.0)
Acid-base deficit: 6 mmol/L — ABNORMAL HIGH (ref 0.0–2.0)
Bicarbonate: 23.9 mmol/L (ref 20.0–28.0)
Bicarbonate: 23.9 mmol/L (ref 20.0–28.0)
Bicarbonate: 25.8 mmol/L (ref 20.0–28.0)
Bicarbonate: 31.1 mmol/L — ABNORMAL HIGH (ref 20.0–28.0)
Calcium, Ion: 1.13 mmol/L — ABNORMAL LOW (ref 1.15–1.40)
Calcium, Ion: 1.14 mmol/L — ABNORMAL LOW (ref 1.15–1.40)
Calcium, Ion: 1.18 mmol/L (ref 1.15–1.40)
Calcium, Ion: 1.19 mmol/L (ref 1.15–1.40)
HCT: 31 % — ABNORMAL LOW (ref 36.0–46.0)
HCT: 31 % — ABNORMAL LOW (ref 36.0–46.0)
HCT: 32 % — ABNORMAL LOW (ref 36.0–46.0)
HCT: 33 % — ABNORMAL LOW (ref 36.0–46.0)
Hemoglobin: 10.5 g/dL — ABNORMAL LOW (ref 12.0–15.0)
Hemoglobin: 10.5 g/dL — ABNORMAL LOW (ref 12.0–15.0)
Hemoglobin: 10.9 g/dL — ABNORMAL LOW (ref 12.0–15.0)
Hemoglobin: 11.2 g/dL — ABNORMAL LOW (ref 12.0–15.0)
O2 Saturation: 100 %
O2 Saturation: 72 %
O2 Saturation: 80 %
O2 Saturation: 91 %
Patient temperature: 101.8
Patient temperature: 101.8
Patient temperature: 37.8
Patient temperature: 98.6
Potassium: 3.9 mmol/L (ref 3.5–5.1)
Potassium: 4.2 mmol/L (ref 3.5–5.1)
Potassium: 4.3 mmol/L (ref 3.5–5.1)
Potassium: 4.6 mmol/L (ref 3.5–5.1)
Sodium: 143 mmol/L (ref 135–145)
Sodium: 146 mmol/L — ABNORMAL HIGH (ref 135–145)
Sodium: 147 mmol/L — ABNORMAL HIGH (ref 135–145)
Sodium: 148 mmol/L — ABNORMAL HIGH (ref 135–145)
TCO2: 25 mmol/L (ref 22–32)
TCO2: 26 mmol/L (ref 22–32)
TCO2: 28 mmol/L (ref 22–32)
TCO2: 33 mmol/L — ABNORMAL HIGH (ref 22–32)
pCO2 arterial: 41.1 mmHg (ref 32.0–48.0)
pCO2 arterial: 54.6 mmHg — ABNORMAL HIGH (ref 32.0–48.0)
pCO2 arterial: 77.6 mmHg (ref 32.0–48.0)
pCO2 arterial: 79.6 mmHg (ref 32.0–48.0)
pH, Arterial: 7.097 — CL (ref 7.350–7.450)
pH, Arterial: 7.141 — CL (ref 7.350–7.450)
pH, Arterial: 7.363 (ref 7.350–7.450)
pH, Arterial: 7.377 (ref 7.350–7.450)
pO2, Arterial: 184 mmHg — ABNORMAL HIGH (ref 83.0–108.0)
pO2, Arterial: 59 mmHg — ABNORMAL LOW (ref 83.0–108.0)
pO2, Arterial: 65 mmHg — ABNORMAL LOW (ref 83.0–108.0)
pO2, Arterial: 66 mmHg — ABNORMAL LOW (ref 83.0–108.0)

## 2019-03-16 LAB — BLOOD GAS, ARTERIAL
Acid-base deficit: 4.5 mmol/L — ABNORMAL HIGH (ref 0.0–2.0)
Bicarbonate: 22.5 mmol/L (ref 20.0–28.0)
Drawn by: 560021
FIO2: 100
O2 Saturation: 97.8 %
Patient temperature: 38.8
pCO2 arterial: 65.7 mmHg (ref 32.0–48.0)
pH, Arterial: 7.173 — CL (ref 7.350–7.450)
pO2, Arterial: 136 mmHg — ABNORMAL HIGH (ref 83.0–108.0)

## 2019-03-16 LAB — BASIC METABOLIC PANEL
Anion gap: 8 (ref 5–15)
Anion gap: 12 (ref 5–15)
BUN: 76 mg/dL — ABNORMAL HIGH (ref 6–20)
BUN: 88 mg/dL — ABNORMAL HIGH (ref 6–20)
CO2: 28 mmol/L (ref 22–32)
CO2: 23 mmol/L (ref 22–32)
Calcium: 8.2 mg/dL — ABNORMAL LOW (ref 8.9–10.3)
Calcium: 7.3 mg/dL — ABNORMAL LOW (ref 8.9–10.3)
Chloride: 109 mmol/L (ref 98–111)
Chloride: 114 mmol/L — ABNORMAL HIGH (ref 98–111)
Creatinine, Ser: 0.83 mg/dL (ref 0.44–1.00)
Creatinine, Ser: 1.75 mg/dL — ABNORMAL HIGH (ref 0.44–1.00)
GFR calc Af Amer: >60 mL/min (ref >60)
GFR calc Af Amer: 36 mL/min — ABNORMAL LOW (ref >60)
GFR calc non Af Amer: >60 mL/min (ref >60)
GFR calc non Af Amer: 31 mL/min — ABNORMAL LOW (ref >60)
Glucose, Bld: 172 mg/dL — ABNORMAL HIGH (ref 70–99)
Glucose, Bld: 84 mg/dL (ref 70–99)
Potassium: 4.9 mmol/L (ref 3.5–5.1)
Potassium: 4 mmol/L (ref 3.5–5.1)
Sodium: 145 mmol/L (ref 135–145)
Sodium: 149 mmol/L — ABNORMAL HIGH (ref 135–145)

## 2019-03-16 LAB — GLUCOSE, CAPILLARY
Glucose-Capillary: 137 mg/dL — ABNORMAL HIGH (ref 70–99)
Glucose-Capillary: 152 mg/dL — ABNORMAL HIGH (ref 70–99)
Glucose-Capillary: 160 mg/dL — ABNORMAL HIGH (ref 70–99)
Glucose-Capillary: 161 mg/dL — ABNORMAL HIGH (ref 70–99)
Glucose-Capillary: 180 mg/dL — ABNORMAL HIGH (ref 70–99)
Glucose-Capillary: 26 mg/dL — CL (ref 70–99)
Glucose-Capillary: 38 mg/dL — CL (ref 70–99)
Glucose-Capillary: 43 mg/dL — CL (ref 70–99)
Glucose-Capillary: 82 mg/dL (ref 70–99)
Glucose-Capillary: 85 mg/dL (ref 70–99)
Glucose-Capillary: 88 mg/dL (ref 70–99)

## 2019-03-16 LAB — FIBRINOGEN: Fibrinogen: 364 mg/dL (ref 210–475)

## 2019-03-16 LAB — HEMOGLOBIN AND HEMATOCRIT, BLOOD
HCT: 36.2 % (ref 36.0–46.0)
Hemoglobin: 10.9 g/dL — ABNORMAL LOW (ref 12.0–15.0)

## 2019-03-16 LAB — PROTIME-INR
INR: 1 (ref 0.8–1.2)
Prothrombin Time: 13.1 seconds (ref 11.4–15.2)

## 2019-03-16 LAB — MAGNESIUM: Magnesium: 3.3 mg/dL — ABNORMAL HIGH (ref 1.7–2.4)

## 2019-03-16 LAB — LACTIC ACID, PLASMA: Lactic Acid, Venous: 3.3 mmol/L (ref 0.5–1.9)

## 2019-03-16 MED ORDER — MIDAZOLAM HCL 2 MG/2ML IJ SOLN
2.0000 mg | Freq: Once | INTRAMUSCULAR | Status: AC
  Administered 2019-03-16: 2 mg via INTRAVENOUS

## 2019-03-16 MED ORDER — NOREPINEPHRINE 4 MG/250ML-% IV SOLN
INTRAVENOUS | Status: AC
  Administered 2019-03-16: 6 ug/min via INTRAVENOUS
  Filled 2019-03-16: qty 250

## 2019-03-16 MED ORDER — HYDROCORTISONE NA SUCCINATE PF 100 MG IJ SOLR: 50.0000 mg | Freq: Four times a day (QID) | INTRAMUSCULAR | Status: DC

## 2019-03-16 MED ORDER — SODIUM BICARBONATE 8.4 % IV SOLN
INTRAVENOUS | Status: AC
  Administered 2019-03-16: 50 meq
  Filled 2019-03-16: qty 50

## 2019-03-16 MED ORDER — PIPERACILLIN-TAZOBACTAM 3.375 G IVPB
3.3750 g | Freq: Three times a day (TID) | INTRAVENOUS | Status: DC
  Administered 2019-03-16 – 2019-03-19 (×9): 3.375 g via INTRAVENOUS
  Filled 2019-03-16 (×9): qty 50

## 2019-03-16 MED ORDER — DEXTROSE 50 % IV SOLN
50.0000 mL | Freq: Once | INTRAVENOUS | Status: AC
  Administered 2019-03-16: 50 mL via INTRAVENOUS

## 2019-03-16 MED ORDER — VANCOMYCIN HCL 2000 MG/400ML IV SOLN
2000.0000 mg | INTRAVENOUS | Status: AC
  Administered 2019-03-16: 2000 mg via INTRAVENOUS
  Filled 2019-03-16: qty 400

## 2019-03-16 MED ORDER — ARTIFICIAL TEARS OPHTHALMIC OINT
1.0000 "application " | TOPICAL_OINTMENT | Freq: Three times a day (TID) | OPHTHALMIC | Status: DC
  Administered 2019-03-16 – 2019-03-21 (×11): 1 via OPHTHALMIC
  Filled 2019-03-16: qty 3.5

## 2019-03-16 MED ORDER — PHENYLEPHRINE HCL-NACL 10-0.9 MG/250ML-% IV SOLN
INTRAVENOUS | Status: AC
  Filled 2019-03-16: qty 250

## 2019-03-16 MED ORDER — SODIUM BICARBONATE 8.4 % IV SOLN: 100.0000 meq | Freq: Once | INTRAVENOUS | Status: DC

## 2019-03-16 MED ORDER — PHENYLEPHRINE HCL-NACL 10-0.9 MG/250ML-% IV SOLN
0.0000 ug/min | INTRAVENOUS | Status: DC
  Administered 2019-03-16: 75 ug/min via INTRAVENOUS
  Administered 2019-03-16: 225 ug/min via INTRAVENOUS
  Filled 2019-03-16 (×3): qty 250

## 2019-03-16 MED ORDER — NOREPINEPHRINE 4 MG/250ML-% IV SOLN
0.0000 ug/min | INTRAVENOUS | Status: DC
  Administered 2019-03-16: 50 ug/min via INTRAVENOUS
  Filled 2019-03-16: qty 250

## 2019-03-16 MED ORDER — VANCOMYCIN HCL 1750 MG/350ML IV SOLN
1750.0000 mg | INTRAVENOUS | Status: DC
  Filled 2019-03-16: qty 350

## 2019-03-16 MED ORDER — VECURONIUM BROMIDE 10 MG IV SOLR
10.0000 mg | INTRAVENOUS | Status: DC | PRN
  Administered 2019-03-16 (×2): 10 mg via INTRAVENOUS
  Filled 2019-03-16 (×2): qty 10

## 2019-03-16 MED ORDER — MIDAZOLAM HCL 2 MG/2ML IJ SOLN
INTRAMUSCULAR | Status: AC
  Filled 2019-03-16: qty 2

## 2019-03-16 MED ORDER — FUROSEMIDE 10 MG/ML IJ SOLN
40.0000 mg | Freq: Once | INTRAMUSCULAR | Status: AC
  Administered 2019-03-16: 40 mg via INTRAVENOUS
  Filled 2019-03-16: qty 4

## 2019-03-16 MED ORDER — DEXTROSE 50 % IV SOLN
INTRAVENOUS | Status: AC
  Administered 2019-03-16: 50 mL
  Filled 2019-03-16: qty 50

## 2019-03-16 MED ORDER — DEXTROSE 50 % IV SOLN
1.0000 | Freq: Once | INTRAVENOUS | Status: AC
  Administered 2019-03-16: 50 mL via INTRAVENOUS

## 2019-03-16 MED ORDER — MIDAZOLAM 50MG/50ML (1MG/ML) PREMIX INFUSION
0.0000 mg/h | INTRAVENOUS | Status: DC
  Administered 2019-03-16: 4 mg/h via INTRAVENOUS
  Administered 2019-03-16 – 2019-03-18 (×7): 8 mg/h via INTRAVENOUS
  Administered 2019-03-19: 6 mg/h via INTRAVENOUS
  Administered 2019-03-19: 19:00:00 2 mg/h via INTRAVENOUS
  Administered 2019-03-20: 04:00:00 4 mg/h via INTRAVENOUS
  Administered 2019-03-21: 07:00:00 2 mg/h via INTRAVENOUS
  Filled 2019-03-16 (×16): qty 50

## 2019-03-16 MED ORDER — VASOPRESSIN 20 UNIT/ML IV SOLN
0.0400 [IU]/min | INTRAVENOUS | Status: DC
  Administered 2019-03-16 – 2019-03-17 (×2): 0.03 [IU]/min via INTRAVENOUS
  Administered 2019-03-19 – 2019-03-20 (×2): 0.04 [IU]/min via INTRAVENOUS
  Filled 2019-03-16 (×8): qty 2

## 2019-03-16 MED ORDER — DEXTROSE 50 % IV SOLN
INTRAVENOUS | Status: AC
  Filled 2019-03-16: qty 50

## 2019-03-16 MED ORDER — NOREPINEPHRINE 16 MG/250ML-% IV SOLN
0.0000 ug/min | INTRAVENOUS | Status: DC
  Administered 2019-03-16: 10 ug/min via INTRAVENOUS
  Administered 2019-03-17 – 2019-03-18 (×10): 70 ug/min via INTRAVENOUS
  Administered 2019-03-19: 65 ug/min via INTRAVENOUS
  Administered 2019-03-19: 45 ug/min via INTRAVENOUS
  Administered 2019-03-19: 40 ug/min via INTRAVENOUS
  Administered 2019-03-19: 45 ug/min via INTRAVENOUS
  Administered 2019-03-20 (×2): 50 ug/min via INTRAVENOUS
  Administered 2019-03-20: 42 ug/min via INTRAVENOUS
  Administered 2019-03-21: 60 ug/min via INTRAVENOUS
  Administered 2019-03-21: 70 ug/min via INTRAVENOUS
  Filled 2019-03-16 (×2): qty 250
  Filled 2019-03-16: qty 500
  Filled 2019-03-16 (×16): qty 250

## 2019-03-16 MED ORDER — SODIUM CHLORIDE 0.9 % IV SOLN
250.0000 mg | Freq: Four times a day (QID) | INTRAVENOUS | Status: DC
  Filled 2019-03-16 (×2): qty 2

## 2019-03-16 MED ORDER — METOPROLOL TARTRATE 5 MG/5ML IV SOLN
5.0000 mg | INTRAVENOUS | Status: DC | PRN
  Administered 2019-03-16: 5 mg via INTRAVENOUS
  Filled 2019-03-16: qty 5

## 2019-03-16 MED ORDER — SODIUM CHLORIDE 0.9 % IV SOLN
250.0000 mg | Freq: Four times a day (QID) | INTRAVENOUS | Status: DC
  Filled 2019-03-16 (×5): qty 2

## 2019-03-16 MED ORDER — METOPROLOL TARTRATE 5 MG/5ML IV SOLN
INTRAVENOUS | Status: AC
  Filled 2019-03-16: qty 5

## 2019-03-16 MED ORDER — SODIUM CHLORIDE 0.9 % IV SOLN
250.0000 mg | Freq: Four times a day (QID) | INTRAVENOUS | Status: DC
  Administered 2019-03-16 – 2019-03-18 (×7): 250 mg via INTRAVENOUS
  Filled 2019-03-16 (×12): qty 2

## 2019-03-16 MED ORDER — STERILE WATER FOR INJECTION IV SOLN
INTRAVENOUS | Status: DC
  Filled 2019-03-16 (×4): qty 850

## 2019-03-16 NOTE — Progress Notes (Addendum)
Providing Compassionate, Quality Care - Together   Subjective: Nurse reports issues with ventilator compliance. Unable to reduce sedation at this time.  Objective: Vital signs in last 24 hours: Temp:  [98.9 F (37.2 C)-99.8 F (37.7 C)] 98.9 F (37.2 C) (12/21 0800) Pulse Rate:  [80-114] 80 (12/21 0900) Resp:  [12-44] 18 (12/21 0900) BP: (72-140)/(56-79) 115/56 (12/21 0900) SpO2:  [91 %-100 %] 99 % (12/21 0900) Arterial Line BP: (86-161)/(46-96) 91/86 (12/21 0900) FiO2 (%):  [60 %-80 %] 60 % (12/21 0801)  Intake/Output from previous day: 12/20 0701 - 12/21 0700 In: 2710.1 [I.V.:980.1; NG/GT:1530; IV Piggyback:200] Out: 1876 [Urine:1720; Drains:156] Intake/Output this shift: Total I/O In: 336.9 [I.V.:111.9; NG/GT:225] Out: 367 [Urine:350; Drains:17]  Per bedside RN: Propofol and fentanyl for sedation Pupils equal, round, sluggish bilaterally Questionable withdrawing vs. decorticate posturing to painful stimulus Ventilator on pressure control 60% FiO2, 10 PEEP, 14 PC above PEEP EVD in place, dressing is clean, dry, and intact EVD with between 5-10 mL output per hour  Lab Results: Recent Labs    03/15/19 0611 03/16/19 0400 03/16/19 0518  WBC 18.0*  --  10.7*  HGB 13.1 10.5* 11.2*  HCT 41.9 31.0* 36.7  PLT 226  --  157   BMET Recent Labs    03/15/19 0611 03/16/19 0400 03/16/19 0518  NA 142 143 145  K 5.0 4.6 4.9  CL 107  --  109  CO2 26  --  28  GLUCOSE 166*  --  172*  BUN 65*  --  76*  CREATININE 1.09*  --  0.83  CALCIUM 8.3*  --  8.2*    Studies/Results: CT HEAD WO CONTRAST  Result Date: 03/15/2019 CLINICAL DATA:  Follow-up examination for intracranial hemorrhage. EXAM: CT HEAD WITHOUT CONTRAST TECHNIQUE: Contiguous axial images were obtained from the base of the skull through the vertex without intravenous contrast. COMPARISON:  Prior CT from 03/14/2019. FINDINGS: Brain: Previously identified hemorrhage positioned at the left posterior fossa again  seen, not significantly changed in size and morphology as compared to previous. Similar regional mass effect with partial effacement of the fourth ventricle and fourth ventricular outflow tract. There has been interval placement of a right frontal approach ventriculostomy with tip positioned within the right lateral ventricle near the foramen of Monro. Small amount of hemorrhage seen along the catheter tract. Previously seen hydrocephalus has resolved, with interval decompression of the ventricular system. No other new acute intracranial hemorrhage. No acute large vessel territory infarct. Previously noted hypodensity involving the occipital regions bilaterally felt to be artifactual on prior exam. No midline shift. No extra-axial fluid collection. Vascular: No hyperdense vessel. Skull: Right frontal approach ventriculostomy. Sinuses/Orbits: Globes and orbital soft tissues within normal limits. Mucosal thickening and opacity within the posterior ethmoidal air cells and sphenoid sinuses, with fluid seen layering within the nasopharynx. Bilateral mastoid effusions. Patient is intubated. Other: None. IMPRESSION: 1. Interval placement of right frontal approach ventriculostomy with tip positioned within the right lateral ventricle near the foramen of Monro. Previously seen hydrocephalus has resolved, with interval decompression of the ventricular system. 2. No significant interval change in size and morphology of acute left posterior fossa hemorrhage with similar regional mass effect. 3. No other new acute intracranial abnormality. Electronically Signed   By: Rise Mu M.D.   On: 03/15/2019 02:51   CT HEAD WO CONTRAST  Result Date: 03/14/2019 CLINICAL DATA:  Altered mental status.  COVID-19 infection. EXAM: CT HEAD WITHOUT CONTRAST TECHNIQUE: Contiguous axial images were obtained from the  base of the skull through the vertex without intravenous contrast. COMPARISON:  None. FINDINGS: Brain: Acute  predominantly subdural hematoma in the left posterior fossa measuring up to 3.0 cm. Suspected small intraparenchymal hemorrhage within the left cerebellum as well. Resultant mass effect on the cerebellum with effacement of the fourth ventricle and brainstem. Patchy hypodensity in both occipital lobes. Early hydrocephalus with enlargement of the lateral ventricle temporal and occipital horns. Vascular: No hyperdense vessel or unexpected calcification. Skull: Normal. Negative for fracture or focal lesion. Sinuses/Orbits: Partial opacification of the bilateral sphenoid sinuses and mastoid air cells. Other: Partially visualized endotracheal and enteric tubes. IMPRESSION: 1. Acute predominantly subdural hematoma in the left posterior fossa measuring up to 3.0 cm. Suspected small intraparenchymal hemorrhage within the left cerebellum as well. Effacement of the fourth ventricle and brainstem with early hydrocephalus. Emergent neurosurgery consultation recommended. 2. Patchy hypodensity in both occipital lobes, concerning for infarct. Given COVID-19 infection, constellation of findings may be related to underlying venous thrombosis. Follow-up MRI and CTV suggested after decompression. Critical Value/emergent results were called by telephone at the time of interpretation on 03/14/2019 at 11:30 am to [facility] PartnershipproviderDANIEL SMITH , who verbally acknowledged these results. Electronically Signed   By: Obie DredgeWilliam T Derry M.D.   On: 03/14/2019 11:40   DG CHEST PORT 1 VIEW  Result Date: 03/14/2019 CLINICAL DATA:  Acute respiratory syndrome.  COVID-19. EXAM: PORTABLE CHEST 1 VIEW COMPARISON:  March 10, 2019 FINDINGS: The ETT is in good position. The NG tube terminates below today's film. No pneumothorax. Stable right PICC line. Diffuse bilateral pulmonary infiltrates are stable in the interval. IMPRESSION: 1. Support apparatus as above. 2. Diffuse bilateral pulmonary infiltrates are not significantly changed in the interval.  Electronically Signed   By: Gerome Samavid  Williams III M.D   On: 03/14/2019 15:11   CT VENOGRAM HEAD  Result Date: 03/15/2019 CLINICAL DATA:  Concern for dural venous sinus thrombosis. Left-sided posterior fossa hemorrhage. EXAM: CT VENOGRAM HEAD TECHNIQUE: Multidetector CT imaging of the head was performed using the standard protocol during bolus administration of intravenous contrast. Multiplanar CT image reconstructions and MIPs were obtained to evaluate the vascular anatomy. CONTRAST:  75mL OMNIPAQUE IOHEXOL 350 MG/ML SOLN COMPARISON:  Head CT 03/15/2019 FINDINGS: The superior sagittal sinus, internal cerebral veins, vein of Galen, straight sinus, right transverse sinus, right sigmoid sinus, and right jugular bulb are patent without evidence of thrombus or significant stenosis. 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 However, where adequately visualized the left transverse and sigmoid sinuses as well as left jugular bulb appear grossly patent without a discrete filling defect identified to clearly indicate a thrombus. 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 Electronically Signed   By: Sebastian AcheAllen  Grady M.D.   On: 03/15/2019 14:19    Assessment/Plan: Patient suffered an acute predominantly subdural hematoma in the left posterior fossa on 03/14/2019, while [facility]. There was also a suspected small intraparenchymal hemorrhage within the left cerebellum. There was effacement of the fourth ventricle and brainstem with early hydrocephalus and a right frontal EVD was placed 03/14/2019 by Dr. Jordan LikesPool. CTV performed 03/15/2019 demonstrates left transverse and left sigmoid sinuses without convincing venous sinus thrombosis.   LOS: 13 days   -Ventilator support per CCM -Continue EVD at 15  vm H2O for now. No sign of venous sinus thrombus per CTV   Val EagleMeghan Bergman,  DNP, AGNP-C Nurse Practitioner  [facility] [place] [address], [place], [place] [address] P: [phone]    F: [phone]  03/16/2019, 9:49 AM   I agree with the above assessment and plan.  Currently I do not feel that the patient's medical condition is such that she would tolerate posterior fossa surgery well.  The subdural hematoma is significant but not severe enough to cause marked brainstem compression.  Plan to continue treatment with external ventricular drainage.

## 2019-03-16 NOTE — Progress Notes (Signed)
RT NOTES: Assisted nursing staff with proning patient.

## 2019-03-16 NOTE — Progress Notes (Signed)
NAME:  Deborah Bond, MRN:  [id number], DOB:  [birth year], LOS: 51 ADMISSION DATE:  03/22/2019, CONSULTATION DATE:  12/10 REFERRING MD:  Bonner Puna, CHIEF COMPLAINT:  Dyspnea  Brief History   58 y/o female admitted on 12/8 with dyspnea, cough and hypoxemia due to COVID 19.  Moved to [facility] on 12/9.  Early in the AM on 12/10 she was moved to the ICU for worsening hypoxemia.    Past Medical History  Hyperlipidemia Thyroid disease Depression  Significant Hospital Events   12/9 admission to [facility] 12/10 ICU transfer, intubation 12/16 pressure support triales 12/17 pressure support all day, not awake enough to extubate 12/19 transfer to [facility]N for left posterior fossa hemorrhage with obstructive hydrocephalus, NSGY eval and EVD   Consults:  PCCM NSGY  Procedures:  12/10 Arterial line>> 12/19 Right frontal ventriculostomy>>  Significant Diagnostic Tests:  12/19 CT Head > Acute predominantly subdural hematoma in the left posterior fossa measuring up to 3.0 cm. Suspected small intraparenchymal hemorrhage within the left cerebellum as well. Effacement of the fourth ventricle and brainstem with early hydrocephalus. Patchy hypodensity in both occipital lobes, concerning for infarct. Given COVID-19 infection, constellation of findings may be related to underlying venous thrombosis. 12/20 CT Head > Interval placement of right frontal approach ventriculostomy with tip positioned within the right lateral ventricle near the foramen of Monro. Previously seen hydrocephalus has resolved, with interval decompression of the ventricular system. No significant interval change in size and morphology of acute left posterior fossa hemorrhage with similar regional mass effect.  No other new acute intracranial abnormality. 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  Micro Data:  12/8 POC SARS COV 2 > positive 12/8 blood >Negative  MRSA  surveillance negative   Antimicrobials/COVID Rx:  12/8 ceftriaxone x1 12/8 azithromycin x1 12/8 remdesivir > 12/12 12/8 decadron >12/17  12/9 tocilizumab x1 12/9 CCP x1  Interim history/subjective:  EVD with minimal output. No weaning yet.  Objective   Blood pressure (!) 115/56, pulse 80, temperature 98.9 F (37.2 C), temperature source Oral, resp. rate 18, height 5' (1.524 m), weight 105.6 kg, SpO2 99 %.    Vent Mode: PCV FiO2 (%):  [60 %-80 %] 60 % Set Rate:  [25 bmp-35 bmp] 25 bmp Vt Set:  [360 mL] 360 mL PEEP:  [10 cmH20] 10 cmH20 Plateau Pressure:  [21 cmH20] 21 cmH20   Intake/Output Summary (Last 24 hours) at 03/16/2019 0935 Last data filed at 03/16/2019 0900 Gross per 24 hour  Intake 2338.96 ml  Output 2063 ml  Net 275.96 ml   Filed Weights   03/12/19 0421 03/13/19 0417 03/14/19 0439  Weight: 105.9 kg 104.9 kg 105.6 kg    Examination (performed from doorway with assistance of RN): General: Obese female, resting in bed, in NAD. Neuro: Sedated.  Not following commands. HEENT: [place]/AT. R EVD in place.  Sclerae anicteric. ETT in place. Cardiovascular: RRR, no M/R/G.  Lungs: Respirations even and unlabored.  CTA bilaterally, No W/R/R. Abdomen: BS x 4, soft, NT/ND.  Musculoskeletal: No gross deformities, no edema. Skin: Intact, warm, no rashes.  Assessment & Plan:   ARDS due to COVID 19 pneumonia with respiratory failure:  Improving.  P/F currently 306 on PC 14/10 with 0.6 FiO2.  S/p course of remdesivir and decadron. -Continue full ventilator support. -Accept SpO2 > 88%. -Bronchial hygiene. -RT/bronchodilator protocol. -Continue Zinc, vitamin C .  Hyperglycemia -  Improving gradually. -Continue scheduled novolog to 4U Q4H -  Continue resistant SSI q4h -Increase detemir to 10U BID  Hypothyroidism -Synthroid  Depression -SSRI  Encephalopathy 2/2 posterior fossa hemorrhage  - S/P R frontal ventriculostomy for obstructive hydrocephalus as well as  hypertonic saline. Concern for venous sinus thrombosis - CT venogram negative. -Neuroprotective measures: Maintain euthermia, euglycemia, eunatremia, normoxia as able, pCO2 35-40 as able, nutrition and bowel regimen, seizure precautions, head of bed elevated -EVD per neurosurgery -Serial neuro exams -AED -Titrate cardene for SBP <140 PRN   Best practice:  Diet: tube feeding Pain/Anxiety/Delirium protocol (if indicated): Propofol gtt / Fentanyl gtt.  RASS goal -1. VAP protocol (if indicated): yes DVT prophylaxis: SCDs GI prophylaxis: famotidine Glucose control: see above Mobility: bed rest Code Status: full Family Communication: will update Disposition: remain in ICU   CC time: 35 min.   Rutherford Guys, [place] [facility] 03/16/2019, 10:12 AM

## 2019-03-16 NOTE — Progress Notes (Addendum)
PCCM Interval Progress Note  Asked to see pt for hypoxia, tachycardia, tachypnea. FiO2 increased from 50% to 100% and sats improved. Sedation increased; however, dropped, pressures with MAPs in 40s.  CXR and ABG ordered.  CXR with multifocal infiltrates.  Likely HCAP though some concern for [facility] given recent bloody secretions. ABG 7.37 / 41 / 66 (vent settings PC 14/10, 100%, rate 28).  Will start vanc / zosyn and obtain sputum culture. Start pulse dose steroids x 3 days. Not ideal to bronch at this time. Switch from propofol to versed infusion (and continue fentanyl). May need paralytic. Start proning given P/F 66. Start levophed to maintain MAP > 65.  Additional crit care time 35 min.   Montey Hora, [place] [facility] 03/16/2019, 4:29 PM   Having fever, increase O2 requirements, bloody secretions from ETT.  CXR with worsening b/l ASD.  Concern for vasculitis as cause of neuro findings.  PaO2/FiO2 ratio 67.  Will add ABx, start pulsed steroids, adjust sedation, add prn paralytic and proceed with prone positioning.  Critical care time by me 34 minutes.  Chesley Mires, MD [facility] Pulmonary/Critical Care 03/16/2019, 4:55 PM

## 2019-03-16 NOTE — Progress Notes (Signed)
Assisted tele visit to patient with family members.  Gussie Towson Anderson, RN   

## 2019-03-16 NOTE — Progress Notes (Addendum)
[facility] Progress Note Patient Name: Deborah Bond DOB: November 26, 1960 MRN: [id number]   Date of Service  03/16/2019  HPI/Events of Note  ABG on 100%/PRVC 32/TV 360/P 16 = 7.097/79.6/59.0. Lactic Acid = 3.3.  eICU Interventions  Will order: 1. NaHCO3 200 meq IV now.  2. Increase NaHCO3 IV infusion to 125 mL/hour.  3. 0.9 NaCl 1 liter IV over 1 hour now.  4. Repeat ABG at 2 AM.     Intervention Category Major Interventions: Acid-Base disturbance - evaluation and management;Respiratory failure - evaluation and management  Lysle Dingwall 03/16/2019, 11:57 PM

## 2019-03-16 NOTE — Progress Notes (Addendum)
[facility] Progress Note Patient Name: Deborah Bond DOB: 1960-10-03 MRN: [id number]   Date of Service  03/16/2019  HPI/Events of Note  Multiple issues: 1. Hypoglycemia - Tube feeds off to prone patient and 2. Hypotension.   eICU Interventions  Will order: 1. Increase ceiling on Norepinephrine IV infusion to 70 mcg/min. 2. Vasopressin IV infusion at shock dose.  3. NaHCO3 100 meq IV now. 4. Increase PRVC rate to 32. 5. Repeat ABG at 10:30 PM 6. NaHCO3 IV infusion at 75 mL/hour.      Intervention Category Major Interventions: Other:;Hypotension - evaluation and management  Lysle Dingwall 03/16/2019, 8:56 PM

## 2019-03-16 NOTE — Consult Note (Addendum)
Neurology Consultation  Reason for Consult: encephalopathy/ [place] Referring Physician: Dr. Halford Chessman  CC: SOB, Cough COVID PNA  History is obtained from: medical record  HPI: Deborah Bond is a 58 y.o. female with past medical history of HTN, hypothyroidism and depression who was admitted on 12/8 for worsening SOB, hypoxia  and cough from COVID 19 infection dx on 12/5.  She was being treated with ABX and steroids.  She was admitted to [facility] on 12/9 and then transferred to ICU for worsening hypoxemia requiring intubation.  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   CT head revealed SDH left posterior fossa and small ICH in the cerebellum with effacement of the 4th ventricle and hydrocephalus.  Also with hypodensity in bilateral occipital lobes.  Neurology consulted for further recommendations.    Spoke with patients nurse, Lauree Chandler, RN no new neuro events noted.  Nurse does report issues with ventilator compliance and is unable to wean sedation for neuro exam.  LKW: unknown  tpa given?: no, hemorrhage Premorbid modified Rankin scale (mRS):  0-Completely asymptomatic and back to baseline post-stroke  ICH Score: 4   ROS:  Unable to obtain due to altered mental status. Patient is intubated and sedated   Past Medical History:  Diagnosis Date  . Depression   . Hyperlipidemia   . Thyroid disease      Essential (primary) hypertension   Family History  Problem Relation Age of Onset  . Cancer Mother        lymphoma and lung  . Cancer Father        stomach/pancreatic      Social History:   reports that she has quit smoking. She has never used smokeless tobacco. She reports that she does not drink alcohol or use drugs.  Medications  Current Facility-Administered Medications:  .  Place/Maintain arterial line, , , Until Discontinued **AND** 0.9 %  sodium chloride infusion, , Intra-arterial, PRN, Alfonzo Feller, NP .  acetaminophen (TYLENOL) tablet 650 mg, 650 mg, Per Tube, Q6H PRN, Alfonzo Feller, NP .  chlorhexidine gluconate (MEDLINE KIT) (PERIDEX) 0.12 % solution 15 mL, 15 mL, Mouth Rinse, BID, Simonne Maffucci B, MD, 15 mL at 03/16/19 0823 .  Chlorhexidine Gluconate Cloth 2 % PADS 6 each, 6 each, Topical, Daily, Patrecia Pour, MD, 6 each at 03/16/19 1054 .  famotidine (PEPCID) 40 MG/5ML suspension 20 mg, 20 mg, Per Tube, BID, Patrecia Pour, MD, 20 mg at 03/16/19 0350 .  feeding supplement (PRO-STAT SUGAR FREE 64) liquid 60 mL, 60 mL, Per Tube, BID, Simonne Maffucci B, MD, 60 mL at 03/16/19 0923 .  feeding supplement (VITAL 1.5 CAL) liquid 1,000 mL, 1,000 mL, Per Tube, Continuous, Simonne Maffucci B, MD, Last Rate: 45 mL/hr at 03/15/19 2000, Rate Verify at 03/15/19 2000 .  fentaNYL (SUBLIMAZE) bolus via infusion 50 mcg, 50 mcg, Intravenous, Q15 min PRN, Alfonzo Feller, NP .  fentaNYL (SUBLIMAZE) injection 50-100 mcg, 50-100 mcg, Intravenous, Q2H PRN, Anders Simmonds, MD, 100 mcg at 03/14/19 1337 .  fentaNYL 2585mg in NS 2535m(1028mml) infusion-PREMIX, 50-200 mcg/hr, Intravenous, Continuous, WriAlfonzo FellerP, Last Rate: 15 mL/hr at 03/16/19 1300, 150 mcg/hr at 03/16/19 1300 .  furosemide (LASIX) injection 40 mg, 40 mg, Intravenous, Once, Sood, Vineet, MD .  haloperidol lactate (HALDOL) injection 1-4 mg, 1-4 mg, Intravenous, Q3H PRN, McQSimonne Maffucci MD, 2 mg at 03/13/19 2231 .  hydrALAZINE (  APRESOLINE) injection 10 mg, 10 mg, Intravenous, Q1H PRN, Candee Furbish, MD, 10 mg at 03/14/19 1115 .  insulin aspart (novoLOG) injection 0-20 Units, 0-20 Units, Subcutaneous, Q4H, Simonne Maffucci B, MD, 4 Units at 03/16/19 1103 .  insulin aspart (novoLOG) injection 4 Units, 4 Units, Subcutaneous, Q4H, Alfonzo Feller, NP, 4 Units at 03/16/19 1102 .  insulin detemir (LEVEMIR) injection 10 Units, 10 Units, Subcutaneous, BID, Alfonzo Feller, NP, 10 Units at 03/16/19 [phone] .  levETIRAcetam  (KEPPRA) IVPB 1000 mg/100 mL premix, 1,000 mg, Intravenous, Q12H, Candee Furbish, MD, Stopped at 03/16/19 [phone] .  levothyroxine (SYNTHROID) tablet 100 mcg, 100 mcg, Per Tube, Q0600, Patrecia Pour, MD, 100 mcg at 03/16/19 0645 .  lip balm (CARMEX) ointment, , Topical, PRN, Vance Gather B, MD .  MEDLINE mouth rinse, 15 mL, Mouth Rinse, 10 times per day, Simonne Maffucci B, MD, 15 mL at 03/16/19 1103 .  nicardipine (CARDENE) 79m in 0.86% saline 2053mIV infusion (0.1 mg/ml), 3-15 mg/hr, Intravenous, Continuous, SmCandee FurbishMD, Last Rate: 30 mL/hr at 03/16/19 1320, 3 mg/hr at 03/16/19 1320 .  ondansetron (ZOFRAN) tablet 4 mg, 4 mg, Oral, Q6H PRN **OR** ondansetron (ZOFRAN) injection 4 mg, 4 mg, Intravenous, Q6H PRN, GaGala Romney, MD, 4 mg at 03/04/19 1343 .  polyethylene glycol (MIRALAX / GLYCOLAX) packet 17 g, 17 g, Per Tube, BID, McJuanito DoomMD, Stopped at 03/16/19 [phone]  propofol (DIPRIVAN) 1000 MG/100ML infusion, 5-80 mcg/kg/min, Intravenous, Titrated, AlRigoberto NoelMD, Last Rate: 25.3 mL/hr at 03/16/19 1300, 40 mcg/kg/min at 03/16/19 1300 .  sodium chloride flush (NS) 0.9 % injection 10-40 mL, 10-40 mL, Intracatheter, Q12H, AlRigoberto NoelMD, 10 mL at 03/16/19 0924 .  sodium chloride flush (NS) 0.9 % injection 10-40 mL, 10-40 mL, Intracatheter, PRN, AlRigoberto NoelMD .  vitamin C (ASCORBIC ACID) tablet 500 mg, 500 mg, Per Tube, Daily, GrPatrecia PourMD, 500 mg at 03/16/19 0923 .  zinc sulfate capsule 220 mg, 220 mg, Per Tube, Daily, GrPatrecia PourMD, 220 mg at 03/16/19 [id number] Exam: Current vital signs: BP 129/61   Pulse 85   Temp 98.9 F (37.2 C) (Oral)   Resp 18   Ht 5' (1.524 m)   Wt 105.6 kg   SpO2 95%   BMI 45.47 kg/m  Vital signs in last 24 hours: Temp:  [98.9 F (37.2 C)-99.8 F (37.7 C)] 98.9 F (37.2 C) (12/21 0800) Pulse Rate:  [75-109] 85 (12/21 1300) Resp:  [11-31] 18 (12/21 1300) BP: (72-140)/(51-68) 129/61 (12/21 1300) SpO2:  [91 %-100 %] 95 %  (12/21 1300) Arterial Line BP: (86-161)/(46-88) 147/51 (12/21 1300) FiO2 (%):  [60 %-80 %] 60 % (12/21 1112)  GENERAL: patient is intubated and sedated on propofol and fentanyl HENT: - Normocephalic and atraumatic, dry mm, ET tube in place. R EVD  Eyes: normal conjunctiva  LUNGS - Rhonchi auscultation bilaterally with no wheezes, lots of thick brown secretions CV - S1S2 RRR, no m/r/g, equal pulses bilaterally. ABDOMEN - Soft, nontender, nondistended with normoactive BS Ext: warm, well perfused, intact peripheral pulses, +1edema PSYCH: sedated   NEURO:  Mental Status: patient is sedated and intubated on propofol Language:nonverbal, not following commands Cranial Nerves: PERRL 40m73mluggish. EOMI, visual fields no blink to threat, weak ccorneal right, left corneal absent, no facial asymmetry, facial sensation intact, hearing intact, unable to assess-tongue/uvula/soft palate midline, strong cough/gag, No evidence of tongue atrophy or fibrillations Motor: no response  to painful stimuli Tone: is normal and bulk is normal Sensation- no response to noxious stimuli Coordination: unable to assess Gait- deferred   Labs I have reviewed labs in epic and the results pertinent to this consultation are:   CBC    Component Value Date/Time   WBC 10.7 (H) 03/16/2019 0518   RBC 3.72 (L) 03/16/2019 0518   HGB 11.2 (L) 03/16/2019 0518   HGB 13.8 12/09/2018 0917   HCT 36.7 03/16/2019 0518   HCT 42.1 12/09/2018 0917   PLT 157 03/16/2019 0518   PLT 340 12/09/2018 0917   MCV 98.7 03/16/2019 0518   MCV 90 12/09/2018 0917   MCH 30.1 03/16/2019 0518   MCHC 30.5 03/16/2019 0518   RDW 14.0 03/16/2019 0518   RDW 13.3 12/09/2018 0917   LYMPHSABS 0.6 (L) 03/10/2019 0545   LYMPHSABS 2.2 12/09/2018 0917   MONOABS 0.5 03/10/2019 0545   EOSABS 0.0 03/10/2019 0545   EOSABS 0.2 12/09/2018 0917   BASOSABS 0.0 03/10/2019 0545   BASOSABS 0.1 12/09/2018 0917    CMP     Component Value Date/Time   NA 145  03/16/2019 0518   NA 142 12/09/2018 0917   K 4.9 03/16/2019 0518   CL 109 03/16/2019 0518   CO2 28 03/16/2019 0518   GLUCOSE 172 (H) 03/16/2019 0518   BUN 76 (H) 03/16/2019 0518   BUN 19 12/09/2018 0917   CREATININE 0.83 03/16/2019 0518   CALCIUM 8.2 (L) 03/16/2019 0518   PROT 6.2 (L) 03/10/2019 0545   PROT 6.6 12/09/2018 0917   ALBUMIN 3.0 (L) 03/10/2019 0545   ALBUMIN 4.1 12/09/2018 0917   AST 26 03/10/2019 0545   ALT 56 (H) 03/10/2019 0545   ALKPHOS 65 03/10/2019 0545   BILITOT 0.7 03/10/2019 0545   BILITOT 0.3 12/09/2018 0917   GFRNONAA >60 03/16/2019 0518   GFRAA >60 03/16/2019 0518    Lipid Panel     Component Value Date/Time   CHOL 179 12/09/2018 0917   TRIG 228 (H) 03/14/2019 1838   HDL 52 12/09/2018 0917   CHOLHDL 3.4 12/09/2018 0917   LDLCALC 103 (H) 12/09/2018 0917     Imaging I have reviewed the images obtained:  CT-scan of the brain   --12/19  Acute predominantly subdural hematoma in the left posterior fossa measuring up to 3.0 cm. Suspected small intraparenchymal hemorrhage within the left cerebellum as well. Effacement of the fourth ventricle and brainstem with early hydrocephalus.  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 female with past medical history of HTN, hypothyroidism and depression who was admitted on 12/8 for worsening SOB, hypoxia  and cough from COVID 19 infection dx on 12/5.  She was being treated with ABX and steroids.  She was admitted to [facility] on 12/9 and then transferred to ICU for worsening hypoxemia requiring intubation.  ON 12/19 she was transferred to [facility] 4N ICU for left posterior fossa hemorrhage with obstructive  hydrocephalus requiring EVD.  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  Possible etiologies include embolic cerebellar stroke with hemorrhagic conversion, hypertensive hemorrhage, hemorrhagic posterior reversible encephalopathy syndrome(PRES), or less likely covid vasculitis.  There is no evidence of venous sinus thrombosis on CTV.  Currently, due to dyssynchrony with the vent and difficulty oxygenating her, she has had increasing sedation, but she does appear to withdraw minimally to noxious stimulation on my exam.  CT angiogram of the head could help look for evidence of vasculitis, continue strict blood pressure control.  This patient is critically ill and at significant risk of neurological worsening, death and care requires constant monitoring of vital signs, hemodynamics,respiratory and cardiac monitoring, neurological assessment, discussion with family, other specialists and medical decision making of high complexity. I spent 45 minutes of neurocritical care time  in the care of  this patient. This was time spent independent of any time provided by nurse practitioner or PA.  MRoland Rack MD [facility] [phone] If 7pm- 7am, please page neurology on call as listed in [facility] 03/16/2019  4:21 PM

## 2019-03-16 NOTE — Significant Event (Signed)
PCCM Interval Note  Called to bedside to eval patient r/t ongoing hypotension and hypoglycemia.   Better P/F ratio since being proned but worsening resp acidosis/ shock. Ongoing fevers  Husband, who is COVID positive as well, is currently at bedside, been allowed for brief visit.   On levophed 70 mcg/min and vasopressin, fentanyl gtt 200 mcg/min and versed 8 mg/hr.  Appropriate abx coverage added earlier and pulse steroids.   Blood pressure (!) 91/57, pulse (!) 133, temperature (!) 101.8 F (38.8 C), temperature source Axillary, resp. rate (!) 33, height 5' (1.524 m), weight 105.6 kg, SpO2 96 %.  Vent Mode: PRVC FiO2 (%):  [60 %-100 %] 100 % Set Rate:  [25 bmp-32 bmp] 32 bmp Vt Set:  [360 mL] 360 mL PEEP:  [10 cmH20-16 cmH20] 16 cmH20 Plateau Pressure:  [21 cmH20] 21 cmH20 - this is her 8cc/kg, rate recently increased by EMD - patient currently very dyssynchronous with MV, MVe 9  P:  Getting prn vecuronium now and will continue prn for vent synchrony Pending ABG  Ongoing RASS goal -5, fentanyl/ versed gtts Check CXR to r/o ptx given high MV settings, although oxygenation is improving, less likely PE/ PTX Bicarb push now, awaiting bicarb gtt per EMD Adding neosynephrine for MAP goal >65 Will check hemoglobin r/o ABLA S/p D50 with improvement in CBG.  Restarting trickle tf for now, levemir and TF coverage d/c'd May need to add D10 gtt if ongoing On pulse steroids which will address any AI Continuing abx (vanc/ zosyn) and following cultures    I have discussed case with Dr. Jonnie Finner, plan as above but little else to offer.  Very concerned she will not survive the night despite maximum medical therapies.  Bedside discussion held with husband given her continued decompensation and these concerns were relayed.  He obviously is distraught as he wonders why he did not get as sick with COVID like she did.  After discussions, he is in agreement that CPR/ defib would be no added benefit for her  if she does not respond to our interventions.  We will continue all other aggressive care for now.  Orders changed to reflect NO CPR/ defib.   Additional CCT 35 mins    Kennieth Rad, MSN, AGACNP-BC [facility] 03/16/2019, 10:46 PM

## 2019-03-17 ENCOUNTER — Inpatient Hospital Stay (HOSPITAL_COMMUNITY): Payer: BC Managed Care – PPO

## 2019-03-17 DIAGNOSIS — I959 Hypotension, unspecified: Secondary | ICD-10-CM

## 2019-03-17 DIAGNOSIS — J069 Acute upper respiratory infection, unspecified: Secondary | ICD-10-CM

## 2019-03-17 DIAGNOSIS — G9340 Encephalopathy, unspecified: Secondary | ICD-10-CM

## 2019-03-17 LAB — CBC
HCT: 36.5 % (ref 36.0–46.0)
Hemoglobin: 10.4 g/dL — ABNORMAL LOW (ref 12.0–15.0)
MCH: 29.8 pg (ref 26.0–34.0)
MCHC: 28.5 g/dL — ABNORMAL LOW (ref 30.0–36.0)
MCV: 104.6 fL — ABNORMAL HIGH (ref 80.0–100.0)
Platelets: 169 10*3/uL (ref 150–400)
RBC: 3.49 MIL/uL — ABNORMAL LOW (ref 3.87–5.11)
RDW: 14.8 % (ref 11.5–15.5)
WBC: 2.6 10*3/uL — ABNORMAL LOW (ref 4.0–10.5)
nRBC: 0 % (ref 0.0–0.2)

## 2019-03-17 LAB — GLUCOSE, CAPILLARY
Glucose-Capillary: 57 mg/dL — ABNORMAL LOW (ref 70–99)
Glucose-Capillary: 123 mg/dL — ABNORMAL HIGH (ref 70–99)
Glucose-Capillary: 99 mg/dL (ref 70–99)
Glucose-Capillary: 115 mg/dL — ABNORMAL HIGH (ref 70–99)
Glucose-Capillary: 126 mg/dL — ABNORMAL HIGH (ref 70–99)
Glucose-Capillary: 121 mg/dL — ABNORMAL HIGH (ref 70–99)

## 2019-03-17 LAB — POCT I-STAT 7, (LYTES, BLD GAS, ICA,H+H)
Acid-Base Excess: 1 mmol/L (ref 0.0–2.0)
Acid-Base Excess: 4 mmol/L — ABNORMAL HIGH (ref 0.0–2.0)
Acid-Base Excess: 2 mmol/L (ref 0.0–2.0)
Acid-base deficit: 3 mmol/L — ABNORMAL HIGH (ref 0.0–2.0)
Bicarbonate: 27.9 mmol/L (ref 20.0–28.0)
Bicarbonate: 31.5 mmol/L — ABNORMAL HIGH (ref 20.0–28.0)
Bicarbonate: 31.5 mmol/L — ABNORMAL HIGH (ref 20.0–28.0)
Bicarbonate: 28.8 mmol/L — ABNORMAL HIGH (ref 20.0–28.0)
Calcium, Ion: 1 mmol/L — ABNORMAL LOW (ref 1.15–1.40)
Calcium, Ion: 0.95 mmol/L — ABNORMAL LOW (ref 1.15–1.40)
Calcium, Ion: 0.88 mmol/L — CL (ref 1.15–1.40)
Calcium, Ion: 0.83 mmol/L — CL (ref 1.15–1.40)
HCT: 46 % (ref 36.0–46.0)
HCT: 31 % — ABNORMAL LOW (ref 36.0–46.0)
HCT: 29 % — ABNORMAL LOW (ref 36.0–46.0)
HCT: 27 % — ABNORMAL LOW (ref 36.0–46.0)
Hemoglobin: 15.6 g/dL — ABNORMAL HIGH (ref 12.0–15.0)
Hemoglobin: 10.5 g/dL — ABNORMAL LOW (ref 12.0–15.0)
Hemoglobin: 9.9 g/dL — ABNORMAL LOW (ref 12.0–15.0)
Hemoglobin: 9.2 g/dL — ABNORMAL LOW (ref 12.0–15.0)
O2 Saturation: 72 %
O2 Saturation: 66 %
O2 Saturation: 73 %
O2 Saturation: 66 %
Patient temperature: 98.3
Patient temperature: 99
Patient temperature: 98.9
Patient temperature: 98.8
Potassium: 3.9 mmol/L (ref 3.5–5.1)
Potassium: 4.1 mmol/L (ref 3.5–5.1)
Potassium: 4 mmol/L (ref 3.5–5.1)
Potassium: 3.3 mmol/L — ABNORMAL LOW (ref 3.5–5.1)
Sodium: 152 mmol/L — ABNORMAL HIGH (ref 135–145)
Sodium: 152 mmol/L — ABNORMAL HIGH (ref 135–145)
Sodium: 153 mmol/L — ABNORMAL HIGH (ref 135–145)
Sodium: 155 mmol/L — ABNORMAL HIGH (ref 135–145)
TCO2: 30 mmol/L (ref 22–32)
TCO2: 34 mmol/L — ABNORMAL HIGH (ref 22–32)
TCO2: 33 mmol/L — ABNORMAL HIGH (ref 22–32)
TCO2: 30 mmol/L (ref 22–32)
pCO2 arterial: 78.1 mmHg (ref 32.0–48.0)
pCO2 arterial: 94.3 mmHg (ref 32.0–48.0)
pCO2 arterial: 66.7 mmHg (ref 32.0–48.0)
pCO2 arterial: 54.3 mmHg — ABNORMAL HIGH (ref 32.0–48.0)
pH, Arterial: 7.16 — CL (ref 7.350–7.450)
pH, Arterial: 7.133 — CL (ref 7.350–7.450)
pH, Arterial: 7.283 — ABNORMAL LOW (ref 7.350–7.450)
pH, Arterial: 7.333 — ABNORMAL LOW (ref 7.350–7.450)
pO2, Arterial: 49 mmHg — ABNORMAL LOW (ref 83.0–108.0)
pO2, Arterial: 48 mmHg — ABNORMAL LOW (ref 83.0–108.0)
pO2, Arterial: 45 mmHg — ABNORMAL LOW (ref 83.0–108.0)
pO2, Arterial: 38 mmHg — CL (ref 83.0–108.0)

## 2019-03-17 LAB — BASIC METABOLIC PANEL
Anion gap: 18 — ABNORMAL HIGH (ref 5–15)
BUN: 89 mg/dL — ABNORMAL HIGH (ref 6–20)
CO2: 29 mmol/L (ref 22–32)
Calcium: 6.5 mg/dL — ABNORMAL LOW (ref 8.9–10.3)
Chloride: 111 mmol/L (ref 98–111)
Creatinine, Ser: 2.14 mg/dL — ABNORMAL HIGH (ref 0.44–1.00)
GFR calc Af Amer: 28 mL/min — ABNORMAL LOW (ref >60)
GFR calc non Af Amer: 25 mL/min — ABNORMAL LOW (ref >60)
Glucose, Bld: 104 mg/dL — ABNORMAL HIGH (ref 70–99)
Potassium: 4.4 mmol/L (ref 3.5–5.1)
Sodium: 158 mmol/L — ABNORMAL HIGH (ref 135–145)

## 2019-03-17 LAB — PROTIME-INR
INR: 1.2 (ref 0.8–1.2)
Prothrombin Time: 15.6 seconds — ABNORMAL HIGH (ref 11.4–15.2)

## 2019-03-17 LAB — MAGNESIUM: Magnesium: 2.6 mg/dL — ABNORMAL HIGH (ref 1.7–2.4)

## 2019-03-17 LAB — LACTIC ACID, PLASMA: Lactic Acid, Venous: 6.5 mmol/L (ref 0.5–1.9)

## 2019-03-17 MED ORDER — SODIUM BICARBONATE 8.4 % IV SOLN
100.0000 meq | Freq: Once | INTRAVENOUS | Status: AC
  Administered 2019-03-17: 04:00:00 100 meq via INTRAVENOUS
  Filled 2019-03-17: qty 100

## 2019-03-17 MED ORDER — SODIUM CHLORIDE 0.9 % IV SOLN
1.0000 g | Freq: Once | INTRAVENOUS | Status: AC
  Administered 2019-03-17: 17:00:00 1 g via INTRAVENOUS
  Filled 2019-03-17: qty 10

## 2019-03-17 MED ORDER — CALCIUM GLUCONATE-NACL 1-0.675 GM/50ML-% IV SOLN
1.0000 g | Freq: Once | INTRAVENOUS | Status: AC
  Administered 2019-03-17: 08:00:00 1000 mg via INTRAVENOUS
  Filled 2019-03-17: qty 50

## 2019-03-17 MED ORDER — SODIUM BICARBONATE 8.4 % IV SOLN
100.0000 meq | Freq: Once | INTRAVENOUS | Status: AC
  Administered 2019-03-17: 100 meq via INTRAVENOUS

## 2019-03-17 MED ORDER — SODIUM CHLORIDE 0.9 % IV BOLUS
1000.0000 mL | Freq: Once | INTRAVENOUS | Status: AC
  Administered 2019-03-17: 1000 mL via INTRAVENOUS

## 2019-03-17 MED ORDER — VANCOMYCIN HCL IN DEXTROSE 1-5 GM/200ML-% IV SOLN
1000.0000 mg | INTRAVENOUS | Status: DC
  Administered 2019-03-18: 1000 mg via INTRAVENOUS
  Filled 2019-03-17: qty 200

## 2019-03-17 MED ORDER — DEXTROSE 50 % IV SOLN
INTRAVENOUS | Status: AC
  Filled 2019-03-17: qty 50

## 2019-03-17 MED ORDER — DEXTROSE 50 % IV SOLN
INTRAVENOUS | Status: AC
  Administered 2019-03-17: 50 mL
  Filled 2019-03-17: qty 50

## 2019-03-17 MED ORDER — SODIUM BICARBONATE 8.4 % IV SOLN
INTRAVENOUS | Status: AC
  Filled 2019-03-17: qty 100

## 2019-03-17 MED ORDER — DEXTROSE 10 % IV SOLN: INTRAVENOUS | Status: DC

## 2019-03-17 MED ORDER — DEXTROSE 50 % IV SOLN: 1.0000 | Freq: Once | INTRAVENOUS | Status: AC

## 2019-03-17 MED ORDER — SODIUM BICARBONATE 8.4 % IV SOLN
200.0000 meq | Freq: Once | INTRAVENOUS | Status: AC
  Administered 2019-03-17: 200 meq via INTRAVENOUS
  Filled 2019-03-17: qty 200

## 2019-03-17 MED ORDER — FUROSEMIDE 10 MG/ML IJ SOLN
INTRAMUSCULAR | Status: AC
  Filled 2019-03-17: qty 8

## 2019-03-17 MED ORDER — FUROSEMIDE 10 MG/ML IJ SOLN
80.0000 mg | Freq: Once | INTRAMUSCULAR | Status: AC
  Administered 2019-03-17: 08:00:00 80 mg via INTRAVENOUS

## 2019-03-17 MED ORDER — PHENYLEPHRINE CONCENTRATED 100MG/250ML (0.4 MG/ML) INFUSION SIMPLE
0.0000 ug/min | INTRAVENOUS | Status: DC
  Administered 2019-03-17: 260 ug/min via INTRAVENOUS
  Administered 2019-03-17 – 2019-03-18 (×6): 400 ug/min via INTRAVENOUS
  Filled 2019-03-17 (×11): qty 250

## 2019-03-17 MED ORDER — SODIUM BICARBONATE 8.4 % IV SOLN
100.0000 meq | Freq: Once | INTRAVENOUS | Status: AC
  Administered 2019-03-17: 06:00:00 100 meq via INTRAVENOUS

## 2019-03-17 NOTE — Progress Notes (Signed)
Verbal order from Rahul, PA to increase PEEP to 18. RT notified. Lianne Bushy RN BSN.

## 2019-03-17 NOTE — Progress Notes (Signed)
Nutrition Follow-up  DOCUMENTATION CODES:   Morbid obesity  INTERVENTION:   Vital 1.5 @ 45 ml/hr via OG tube 60 ml Prostat BID  Provides: 2020 kcal, 132 grams protein, and 825 ml free water.    NUTRITION DIAGNOSIS:   Increased nutrient needs related to acute illness(COVID 19) as evidenced by estimated needs.  Ongoing.   GOAL:   Patient will meet greater than or equal to 90% of their needs  Progressing.   MONITOR:   TF tolerance, I & O's, Labs, Weight trends, Vent status  REASON FOR ASSESSMENT:   Consult, Ventilator Enteral/tube feeding initiation and management  ASSESSMENT:   58 y.o. female with medical history significant of hypertension, hypothyroidism, depression, tenosynovitis and restless leg syndrome dx with COVID-19 three days ago presented to ED on 12/9 with progressing SOB, found to be hypoxic in the ED and placed on 6L of oxygen, quickly requiring NRB. Intubated on 12/10  Pt discussed during ICU rounds and with RN.  Per RN pt now with septic shock and maxed on 3 pressors. Family discussing goals of care.     Patient is currently intubated on ventilator support MV: 14.7 L/min Temp (24hrs), Avg:99.5 F (37.5 C), Min:98.3 F (36.8 C), Max:101.8 F (38.8 C)  Medications reviewed  Levo @ 70 Neo @ 400  Vaso @ .03  Labs reviewed: Na 153 ICP: 106 ml  Deep pitting edema   Diet Order:   Diet Order    None      EDUCATION NEEDS:   Not appropriate for education at this time  Skin:  Skin Assessment: Skin Integrity Issues: Skin Integrity Issues:: Stage II Stage II: L jaw  Last BM:  12/20 via rectal tube  Height:   Ht Readings from Last 1 Encounters:  03/17/19 5\' 3"  (1.6 m)    Weight:   Wt Readings from Last 1 Encounters:  03/14/19 105.6 kg    Ideal Body Weight:  45.5 kg  BMI:  Body mass index is 41.24 kg/m.  Estimated Nutritional Needs:   Kcal:  8101-7510  Protein:  120-140 grams  Fluid:  >/= 2 L  Maylon Peppers RD, LDN,  CNSC [phone] Pager [phone] After Hours Pager

## 2019-03-17 NOTE — Progress Notes (Addendum)
STROKE TEAM PROGRESS NOTE   HISTORY OF PRESENT ILLNESS (per record) This is a 58yrold admitted on 12/8 with increased sob, hypoxia d/t COVID19 (dx on 12/5). She was intubated on 12/9 and has [facility] which showed Lt SDH with left posterior fossa hemorrhage and 4th ventricle effacement with hydrocephalus. Neurosurgery was consulted and an emergent EVD was placed. Neurology was consulted for further recommendations on 12/21.   INTERVAL HISTORY Her RN are at the bedside. She has had overall worsening of medical condition and coma, GCS... She is now on multiple maxed out pressors and abx. Recommend Palliative Care consult at this time.  I personally reviewed history of present illness, electronic medical records and imaging films in PACS.  She presented with Covid infection with worsening respiratory failure and was enrolled in clinical trial of Lovenox 100 mg plus aspirin or standard of care dose but study medication was held due to nasal bleeding and 24 hours later on CT scan was found to show posterior fossa subdural hemorrhage with brainstem compression and hydrocephalus for which he underwent ventriculostomy but neurological exam as well as pulmonary status remains poor with patient with sepsis and shock unlikely to survive  OBJECTIVE Vitals:   03/17/19 0740 03/17/19 0759 03/17/19 0800 03/17/19 0820  BP:   (!) 90/48 (!) 86/43  Pulse:   (!) 118 (!) 120  Resp:   (!) 35 (!) 0  Temp:      TempSrc:      SpO2:  (!) 80% (!) 78% (!) 76%  Weight:      Height: 5' 3"  (1.6 m)       CBC:  Recent Labs  Lab 03/16/19 0518 03/17/19 0500 03/17/19 0514  WBC 10.7* 2.6*  --   HGB 11.2* 10.4* 10.5*  HCT 36.7 36.5 31.0*  MCV 98.7 104.6*  --   PLT 157 169  --     Basic Metabolic Panel:  Recent Labs  Lab 03/16/19 0518 03/16/19 2147 03/17/19 0500 03/17/19 0514  NA 145 149* 158* 152*  K 4.9 4.0 4.4 4.1  CL 109 114* 111  --   CO2 28 23 29   --    GLUCOSE 172* 84 104*  --   BUN 76* 88* 89*  --   CREATININE 0.83 1.75* 2.14*  --   CALCIUM 8.2* 7.3* 6.5*  --   MG 3.3*  --  2.6*  --     Lipid Panel:     Component Value Date/Time   CHOL 179 12/09/2018 0917   TRIG 228 (H) 03/14/2019 1838   HDL 52 12/09/2018 0917   CHOLHDL 3.4 12/09/2018 0917   LDLCALC 103 (H) 12/09/2018 0917   HgbA1c:  Lab Results  Component Value Date   HGBA1C 5.7 12/09/2018   Urine Drug Screen: No results found for: LABOPIA, COCAINSCRNUR, LABBENZ, AMPHETMU, THCU, LABBARB  Alcohol Level No results found for: [facility]   DG Chest Port 1 View  Result Date: 03/16/2019 CLINICAL DATA:  58year old female with respiratory distress. Positive COVID-19. EXAM: PORTABLE CHEST 1 VIEW COMPARISON:  Earlier radiograph dated 03/16/2019. FINDINGS: Endotracheal tube above the carina, right-sided PICC in similar position, and enteric tube extending below the diaphragm with tip beyond the inferior margin of the image. Overall worsening of the bilateral airspace opacities compared to the earlier radiograph. No pneumothorax. No acute osseous pathology. IMPRESSION: 1. Overall worsening of the bilateral airspace opacities compared to the earlier radiograph. 2.  Stable positioning of the support lines and tubes. Electronically Signed   By: Anner Crete M.D.   On: 03/16/2019 23:57   DG Chest Port 1 View  Result Date: 03/16/2019 CLINICAL DATA:  58 year old female with history of respiratory failure. EXAM: PORTABLE CHEST 1 VIEW COMPARISON:  Chest x-ray 03/14/2019. FINDINGS: An endotracheal tube is in place with tip 2.5 cm above the carina. There is a right upper extremity PICC with tip terminating in the right atrium. A nasogastric tube is seen extending into the stomach, however, the tip of the nasogastric tube extends below the lower margin of the image. Spinal cord stimulator projecting over the lower thoracic and lumbar regions. Patchy multifocal ill-defined ground-glass and  consolidative opacities throughout the lungs bilaterally, most severe throughout the mid to lower lungs where there is also diffuse interstitial prominence. Overall, aeration has worsened, with multiple air bronchograms noted on today's examination. No definite pleural effusions. No pneumothorax. Cardiac silhouette is largely obscured. Aortic atherosclerosis. IMPRESSION: 1. Support apparatus, as above. 2. Worsening aeration in the lungs, compatible with severe progressive multilobar bilateral pneumonia. 3. Aortic atherosclerosis. Electronically Signed   By: Vinnie Langton M.D.   On: 03/16/2019 16:42   CT VENOGRAM HEAD  Result Date: 03/15/2019 CLINICAL DATA:  Concern for dural venous sinus thrombosis. Left-sided posterior fossa hemorrhage. EXAM: CT VENOGRAM HEAD TECHNIQUE: Multidetector CT imaging of the head was performed using the standard protocol during bolus administration of intravenous contrast. Multiplanar CT image reconstructions and MIPs were obtained to evaluate the vascular anatomy. CONTRAST:  51m OMNIPAQUE IOHEXOL 350 MG/ML SOLN COMPARISON:  Head CT 03/15/2019 FINDINGS: The superior sagittal sinus, internal cerebral veins, vein of Galen, straight sinus, right transverse sinus, right sigmoid sinus, and right jugular bulb are patent without evidence of thrombus or significant stenosis. 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 However, where adequately visualized the left transverse and sigmoid sinuses as well as left jugular bulb appear grossly patent without a discrete filling defect identified to clearly indicate a thrombus. 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 Electronically Signed   By: ALogan BoresM.D.   On: 03/15/2019 14:19   VAS [place]OWER EXTREMITY VENOUS (DVT)  Result Date: 03/16/2019  Lower  Venous Study Indications: Covid-19 positive, COVID PACT trial research study.  Comparison Study: No priors. Performing Technologist: ROda CoganRDMS, RVT  Examination Guidelines: A complete evaluation includes B-mode imaging, spectral Doppler, color Doppler, and power Doppler as needed of all accessible portions of each vessel. Bilateral testing is considered an integral part of a complete examination. Limited examinations for reoccurring indications may be performed as noted.  +---------+---------------+---------+-----------+----------+--------------+ RIGHT    CompressibilityPhasicitySpontaneityPropertiesThrombus Aging +---------+---------------+---------+-----------+----------+--------------+ CFV      Full           Yes      Yes                                 +---------+---------------+---------+-----------+----------+--------------+ SFJ      Full                                                        +---------+---------------+---------+-----------+----------+--------------+ FV Prox  Full                                                        +---------+---------------+---------+-----------+----------+--------------+ FV Mid   Full                                                        +---------+---------------+---------+-----------+----------+--------------+ FV DistalFull                                                        +---------+---------------+---------+-----------+----------+--------------+ PFV      Full                                                        +---------+---------------+---------+-----------+----------+--------------+ POP      Full           Yes      Yes                                 +---------+---------------+---------+-----------+----------+--------------+ PTV      Full                                                        +---------+---------------+---------+-----------+----------+--------------+ PERO     Full                                                         +---------+---------------+---------+-----------+----------+--------------+   +---------+---------------+---------+-----------+----------+--------------+ LEFT     CompressibilityPhasicitySpontaneityPropertiesThrombus Aging +---------+---------------+---------+-----------+----------+--------------+ CFV      Full           Yes      Yes                                 +---------+---------------+---------+-----------+----------+--------------+ SFJ      Full                                                        +---------+---------------+---------+-----------+----------+--------------+ FV Prox  Full                                                        +---------+---------------+---------+-----------+----------+--------------+  FV Mid   Full                                                        +---------+---------------+---------+-----------+----------+--------------+ FV DistalFull                                                        +---------+---------------+---------+-----------+----------+--------------+ PFV      Full                                                        +---------+---------------+---------+-----------+----------+--------------+ POP      Full           Yes      Yes                                 +---------+---------------+---------+-----------+----------+--------------+ PTV      Full                                                        +---------+---------------+---------+-----------+----------+--------------+ PERO     Full                                                        +---------+---------------+---------+-----------+----------+--------------+     Summary: Right: There is no evidence of deep vein thrombosis in the lower extremity. Left: There is no evidence of deep vein thrombosis in the lower extremity.  *See table(s) above for measurements and  observations. Electronically signed by Curt Jews MD on 03/16/2019 at 5:39:26 PM.    Final    ECG - SR rate. (See cardiology reading for complete details)  PHYSICAL EXAM Blood pressure (!) 86/43, pulse (!) 120, temperature 99 F (37.2 C), temperature source Axillary, resp. rate (!) 0, height 5' 3"  (1.6 m), weight 105.6 kg, SpO2 (!) 76 %. Obese middle-aged Caucasian lady who is sedated intubated in respiratory distress ventilatory support for respiratory failure.  And on pressors for hemodynamic support. Afebrile. Head is nontraumatic. Neck is supple without bruit.    Cardiac exam no murmur or gallop. Lungs are clear to auscultation. Distal pulses are well felt. Neurological Exam : limited by sedation: Patient is intubated.  Comatose and unresponsive.  Eyes are closed.  No response to sternal rub or nailbed pressure.  Eyes in primary position pupils 3 mm not reactive.  There is conjunctival swelling in both eyes.  Corneal reflexes are absent.  Doll's eye reflexes are absent.  Minimum cough and gag.  Not breathing above ventilator settings.  No spontaneous extremity motor movements.  No response to nailbed pressure in either extremities.  Plantars  not elicitable.  HOME MEDICATIONS:  Medications Prior to Admission  Medication Sig Dispense Refill  . amoxicillin-clavulanate (AUGMENTIN) 875-125 MG tablet Take 1 tablet by mouth 2 (two) times daily. Take all of this medication 20 tablet 0  . aspirin EC 81 MG tablet Take 81 mg by mouth daily.    Marland Kitchen atorvastatin (LIPITOR) 20 MG tablet TAKE ONE (1) TABLET EACH DAY (Patient taking differently: Take 20 mg by mouth daily. ) 90 tablet 3  . Calcium Carbonate-Vitamin D (CALCIUM 600+D) 600-200 MG-UNIT TABS Take by mouth.    . diclofenac sodium (VOLTAREN) 1 % GEL Apply 2 g topically 4 (four) times daily. 100 g 3  . escitalopram (LEXAPRO) 20 MG tablet Take 1 tablet (20 mg total) by mouth daily. 90 tablet 3  . fluticasone (FLONASE) 50 MCG/ACT nasal spray Place 1 spray  into both nostrils daily.     Marland Kitchen ibuprofen (ADVIL) 800 MG tablet TAKE ONE TABLET 3 TIMES A DAY AS NEEDED. (Patient taking differently: Take 800 mg by mouth 3 (three) times daily as needed for fever, headache or mild pain. ) 90 tablet 5  . levothyroxine (SYNTHROID) 100 MCG tablet TAKE ONE (1) TABLET EACH DAY (Patient taking differently: Take 100 mcg by mouth daily before breakfast. ) 90 tablet 3  . Multiple Vitamins-Minerals (HAIR SKIN AND NAILS FORMULA) TABS Take 1 tablet by mouth daily.    . ondansetron (ZOFRAN) 4 MG tablet Take 4 mg by mouth every 8 (eight) hours as needed for nausea or vomiting (DISSOLVE IN THE MOUTH).     . predniSONE (DELTASONE) 10 MG tablet Take 5 daily for 2 days followed by 4,3,2 and 1 for 2 days each. 30 tablet 0  . UNABLE TO FIND Med Name: [facility] Complete        HOSPITAL MEDICATIONS:  . artificial tears  1 application Both Eyes N4B  . chlorhexidine gluconate (MEDLINE KIT)  15 mL Mouth Rinse BID  . Chlorhexidine Gluconate Cloth  6 each Topical Daily  . dextrose      . famotidine  20 mg Per Tube BID  . feeding supplement (PRO-STAT SUGAR FREE 64)  60 mL Per Tube BID  . furosemide      . insulin aspart  0-20 Units Subcutaneous Q4H  . levothyroxine  100 mcg Per Tube Q0600  . mouth rinse  15 mL Mouth Rinse 10 times per day  . polyethylene glycol  17 g Per Tube BID  . sodium chloride flush  10-40 mL Intracatheter Q12H  . vitamin C  500 mg Per Tube Daily  . zinc sulfate  220 mg Per Tube Daily    ALLERGIES No Known Allergies  ASSESSMENT/PLAN Ms. Daiya Tamer is a 58 y.o. female admitted with ARDS d/t COVID pneumonia. On 12/19 a large posterior fossa subdural hemorrhage was found with hydrocephalus. Emergent EVD placed by neurosurgery at that time. She has since further declined with septic shock and is on max dose pressors.   SDH and large posterior fossa hemorrhage with significant brain compression and significant cerebral edema.  Etiology of subdural  hemorrhage likely related to anticoagulation effect of Lovenox and CT scan findings also suggest posterior reversible encephalopathy syndrome though MRI would be more diagnostic.  Resultant  Comatose poor neurological exam which is limited by sedation and poor medical condition as well  Code Stroke CT Head -    ASPECTS- n/a  CT head - acute SDH in left posterior fossa with IPH. Effacement of 4th ventricle and brainstem  w/hypdrocephalus. Patchy hypodensity in both occipital lobes.   MRV- did not show underlying venous thrombosis.  MRI head- n/a  MRA head -n/a  CTA H&N - not done  CT Perfusion- n/a  Carotid Doppler -  2D Echo - 50% EF. Mild LVH.  Sars Corona Virus 2  POSITIVE  LDL - 103    Component Value Date/Time   LDLCALC 103 (H) 12/09/2018 0917     HgbA1c - 5.7  UDS not done  VTE prophylaxis - SCDs d/t bleed Diet  Diet Order    None       ASA prior to admission, now on none  Ongoing aggressive stroke risk factor management  Therapy recommendations:  pending  Disposition:  Pending  Hypertension  Home BP meds: none   Current BP meds: Pressors  Unstable HYPOTENSION at this time . Long-term BP goal normotensive  Hyperlipidemia  Home Lipid lowering medication: Lipitor 32m  LDL 102, goal < 70  Current lipid lowering medication: Lipitor 20 mg daily   Continue statin at discharge  Diabetes  Home diabetic meds: none   Current diabetic meds: SSI   HgbA1c 6, goal < 7.0 Recent Labs    03/17/19 0212 03/17/19 0401 03/17/19 0756  GLUCAP 57* 123* 99     Other Stroke Risk Factors  none   Other Active Problems  COVID + Acute hypoxic respiratory arrest  Septic shock  ARDS d/t Covid pna  Hospital day # [id number]Metzger-Cihelka, ARNP-C, ANVP-BC Pager: [phone] I have personally obtained history,examined this patient, reviewed notes, independently viewed imaging studies, participated in medical decision making and plan of  care.ROS completed by me personally and pertinent positives fully documented  I have made any additions or clarifications directly to the above note. Agree with note above.  Patient's neurological and general medical condition appears to be very poor and declining and is probably not compatible with life.  A large posterior fossa subdural hemorrhage is likely a result of effect of high-dose Lovenox as well as possible posterior reversible encephalopathy syndrome given CT scan changes of white matter edema in the parieto-occipital lobes.  Recommend strict control of blood pressure with systolic goal below 1557  Patient's husband is thinking about withdrawal of care given her poor general prognosis with may be appropriate in this situation.  Long discussion with Dr. AElsworth Sohocritical care medicine and Dr. MKendrick Ranch neurosurgery. This patient is critically ill and at significant risk of neurological worsening, death and care requires constant monitoring of vital signs, hemodynamics,respiratory and cardiac monitoring, extensive review of multiple databases, frequent neurological assessment, discussion with family, other specialists and medical decision making of high complexity.I have made any additions or clarifications directly to the above note.This critical care time does not reflect procedure time, or teaching time or supervisory time of PA/NP/Med Resident etc but could involve care discussion time.  I spent 40 minutes of neurocritical care time  in the care of  this patient.      PAntony Contras MD Medical Director [facility] Pager: [phone]/22/2020 2:30 PM  To contact Stroke Continuity provider, please refer to [email or web address] After hours, contact General Neurology

## 2019-03-17 NOTE — Progress Notes (Signed)
[facility] Progress Note Patient Name: Deborah Bond DOB: [birth year] MRN: [id number]   Date of Service  03/17/2019  HPI/Events of Note  Hypoglycemia - Blood glucose = 56.   eICU Interventions  Will order: 1. D10W to run IV at 30 mL/hour.      Intervention Category Major Interventions: Other:  Dearia Wilmouth Cornelia Copa 03/17/2019, 2:27 AM

## 2019-03-17 NOTE — Progress Notes (Signed)
Assisted tele visit to patient with family member.  Pierre Cumpton R, RN  

## 2019-03-17 NOTE — Progress Notes (Signed)
NAME:  Deborah Bond, MRN:  [id number], DOB:  1961-02-05, LOS: 14 ADMISSION DATE:  03/02/2019, CONSULTATION DATE:  12/10 REFERRING MD:  Jarvis Newcomer, CHIEF COMPLAINT:  Dyspnea  Brief History   58 y/o female admitted on 12/8 with dyspnea, cough and hypoxemia due to COVID 19.  Moved to [facility] on 12/9.  Early in the AM on 12/10 she was moved to the ICU for worsening hypoxemia.    Past Medical History  Hyperlipidemia Thyroid disease Depression  Significant Hospital Events   12/9 admission to [facility] 12/10 ICU transfer, intubation 12/16 pressure support triales 12/17 pressure support all day, not awake enough to extubate 12/19 transfer to [facility] 4N for left posterior fossa hemorrhage with obstructive hydrocephalus, NSGY eval and EVD  12/21 hypoxia, tachycardia, tachypnea.  PF 66 > proned.  Pulse dose steroids added for possible DAH.  Worsening shock overnight despite max pressors, worsening oxygenation and ventilation despite proning.  Consults:  PCCM NSGY  Procedures:  12/10 Arterial line>> 12/19 Right frontal ventriculostomy>>  Significant Diagnostic Tests:  12/19 CT Head > Acute predominantly subdural hematoma in the left posterior fossa measuring up to 3.0 cm. Suspected small intraparenchymal hemorrhage within the left cerebellum as well. Effacement of the fourth ventricle and brainstem with early hydrocephalus. Patchy hypodensity in both occipital lobes, concerning for infarct. Given COVID-19 infection, constellation of findings may be related to underlying venous thrombosis. 12/20 CT Head > Interval placement of right frontal approach ventriculostomy with tip positioned within the right lateral ventricle near the foramen of Monro. Previously seen hydrocephalus has resolved, with interval decompression of the ventricular system. No significant interval change in size and morphology of acute left posterior fossa hemorrhage with similar regional mass effect.  No other new acute intracranial  abnormality. 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 CXR 12/21 > worsening bilateral airspace opacities  Micro Data:  12/8 POC SARS COV 2 > positive 12/8 blood >Negative  MRSA surveillance negative   Antimicrobials/COVID Rx:  12/8 ceftriaxone x1 12/8 azithromycin x1 12/8 remdesivir > 12/12 12/8 decadron >12/17  12/9 tocilizumab x1 12/9 CCP x1  Interim history/subjective:  Refractory shock.  Worsening oxygenation and ventilation.  PF now 44 (was 66 when proning initiated). Last ABG 7.13 / 94 / 48.  Made limited code by husband overnight (no CPR / defib).  Objective   Blood pressure (!) 94/50, pulse (!) 116, temperature 99 F (37.2 C), temperature source Axillary, resp. rate (!) 32, height 5' (1.524 m), weight 105.6 kg, SpO2 (!) 81 %. CVP:  [9 mmHg-15 mmHg] 9 mmHg  Vent Mode: PRVC FiO2 (%):  [60 %-100 %] 100 % Set Rate:  [25 bmp-32 bmp] 32 bmp Vt Set:  [360 mL] 360 mL PEEP:  [10 cmH20-16 cmH20] 16 cmH20 Plateau Pressure:  [31 cmH20] 31 cmH20   Intake/Output Summary (Last 24 hours) at 03/17/2019 0730 Last data filed at 03/17/2019 0600 Gross per 24 hour  Intake 6861.65 ml  Output 1168 ml  Net 5693.65 ml   Filed Weights   03/12/19 0421 03/13/19 0417 03/14/19 0439  Weight: 105.9 kg 104.9 kg 105.6 kg    Examination: General: Obese female, proned, critically ill. Neuro: Sedated.  Not following commands. HEENT: [place]/AT. R EVD in place.  Sclerae anicteric. ETT in place. Cardiovascular: Tachy, regular, no M/R/G.  Lungs: Respirations even and unlabored.  Coarse bilaterally. Abdomen: Obese.  BS x 4, soft, NT/ND.  Musculoskeletal: No gross deformities, no edema. Skin: Intact, warm,  no rashes.  Assessment & Plan:   ARDS due to COVID 19 pneumonia with respiratory failure:  Significantly worsened overnight.  P/F currently 48 PRVC (100%, PEEP 16, R32, Vt 360). S/p course of remdesivir and decadron.   Unfortunately has worsened with proning. -Continue full ventilator support, flip back supine. -Already at 360cc Vt and rate 32.  Will increase rate to 35 and Vt to 420 (re-measured pt and measuring at 5'3, putting her at 420cc as her 8cc). -Repeat ABG now and in 1 hour. -80mg  lasix x 1and assess response, would ideally redose if responds well / tolerates -10mg  Vecuronium PRN vent dysynchrony. -Continue bicarb for now given profound acidosis without much room for vent adjustments.  Will revisit later and see if can stop as acidosis is primarily a respiratory issue at this point. -Accept SpO2 > 88%. -Bronchial hygiene. -RT/bronchodilator protocol. -Continue Zinc, vitamin C .  Possible DAH - bloody secretions noted in ETT with extensive bilateral airspace disease. - Continue pulse dose steroids through 12/23.  Possible HCAP. - Continue vanc / zosyn. - Follow cultures.  Refractory shock. - Continue levo, vaso, neo for goal MAP > 65.  Hypoglycemia. - Continue D10.  Hypothyroidism -Synthroid  Depression -SSRI  Encephalopathy 2/2 posterior fossa hemorrhage  - S/P R frontal ventriculostomy for obstructive hydrocephalus as well as hypertonic saline. Concern for venous sinus thrombosis - CT venogram negative. -Neuroprotective measures: Maintain euthermia, euglycemia, eunatremia, normoxia as able, pCO2 35-40 as able, nutrition and bowel regimen, seizure precautions, head of bed elevated -EVD per neurosurgery -Serial neuro exams -AED   Best practice:  Diet: tube feeding on hold. Pain/Anxiety/Delirium protocol (if indicated): Fentanyl gtt / Midazolam gtt.  RASS goal -4. VAP protocol (if indicated): yes DVT prophylaxis: SCDs GI prophylaxis: famotidine Glucose control: see above Mobility: bed rest Code Status: Limited - no CPR / defib. Family Communication: Updated afternoon 12/21 and overnight.  Will update again this AM. Disposition: remain in ICU   CC time: 60 min.   Montey Hora, [facility] 03/17/2019, 7:30 AM

## 2019-03-17 NOTE — Progress Notes (Signed)
Patient with overall worsening consistent with septic shock.  Patient now on multiple pressors and broad-spectrum antibiotics.  At this point I do not have anything significant to offer.  We will continue with external ventricular drainage.

## 2019-03-17 NOTE — Progress Notes (Signed)
[facility] Progress Note Patient Name: Deborah Bond DOB: 08-28-1960 MRN: [id number]   Date of Service  03/17/2019  HPI/Events of Note  ABG on 100%/PRVC 32/TV 360/P 16 = 7.133/94.3/48.0   eICU Interventions  Will order: 1. NaHCO3 100 meq IV now. 2. Repeat ABG at 7:30 AM.     Intervention Category Major Interventions: Acid-Base disturbance - evaluation and management;Respiratory failure - evaluation and management  Pistol Kessenich Cornelia Copa 03/17/2019, 5:24 AM

## 2019-03-17 NOTE — Progress Notes (Signed)
Pharmacy Antibiotic Note  Deborah Bond is a 58 y.o. female admitted on 03-27-19 with covid. Now with PNA on the vent - growing proteus and staph aureus (suscept pending)  Renal fx has acutely worsened.  Plan: Adjust vanc to 1 g q36 hr - new auc 507 Zosyn 3.375 gm iv q8 F/U cx for de-escalation  Height: 5\' 3"  (160 cm) Weight: 232 lb 12.9 oz (105.6 kg) IBW/kg (Calculated) : 52.4  Temp (24hrs), Avg:100.4 F (38 C), Min:98.3 F (36.8 C), Max:102.9 F (39.4 C)  Recent Labs  Lab 03/14/19 0438 03/15/19 0611 03/16/19 0518 03/16/19 2147 03/16/19 2230 03/17/19 0500 03/17/19 0607  WBC 29.3* 18.0* 10.7*  --   --  2.6*  --   CREATININE 0.76 1.09* 0.83 1.75*  --  2.14*  --   LATICACIDVEN  --   --   --   --  3.3*  --  6.5*    Estimated Creatinine Clearance: 32.9 mL/min (A) (by C-G formula based on SCr of 2.14 mg/dL (H)).    No Known Allergies  Barth Kirks, PharmD, BCPS, BCCCP Clinical Pharmacist [phone]  Please check AMION for all [place] numbers  03/17/2019 12:46 PM

## 2019-03-18 DIAGNOSIS — R6521 Severe sepsis with septic shock: Secondary | ICD-10-CM

## 2019-03-18 DIAGNOSIS — I618 Other nontraumatic intracerebral hemorrhage: Secondary | ICD-10-CM

## 2019-03-18 DIAGNOSIS — G919 Hydrocephalus, unspecified: Secondary | ICD-10-CM

## 2019-03-18 DIAGNOSIS — A419 Sepsis, unspecified organism: Secondary | ICD-10-CM

## 2019-03-18 DIAGNOSIS — J1520 Pneumonia due to staphylococcus, unspecified: Secondary | ICD-10-CM

## 2019-03-18 LAB — PROTIME-INR
INR: 1.5 — ABNORMAL HIGH (ref 0.8–1.2)
Prothrombin Time: 18.4 seconds — ABNORMAL HIGH (ref 11.4–15.2)

## 2019-03-18 LAB — POCT I-STAT 7, (LYTES, BLD GAS, ICA,H+H)
Acid-Base Excess: 4 mmol/L — ABNORMAL HIGH (ref 0.0–2.0)
Acid-Base Excess: 1 mmol/L (ref 0.0–2.0)
Acid-Base Excess: 2 mmol/L (ref 0.0–2.0)
Bicarbonate: 29.6 mmol/L — ABNORMAL HIGH (ref 20.0–28.0)
Bicarbonate: 28.1 mmol/L — ABNORMAL HIGH (ref 20.0–28.0)
Bicarbonate: 28.5 mmol/L — ABNORMAL HIGH (ref 20.0–28.0)
Calcium, Ion: 0.75 mmol/L — CL (ref 1.15–1.40)
Calcium, Ion: 0.94 mmol/L — ABNORMAL LOW (ref 1.15–1.40)
Calcium, Ion: 0.88 mmol/L — CL (ref 1.15–1.40)
HCT: 28 % — ABNORMAL LOW (ref 36.0–46.0)
HCT: 29 % — ABNORMAL LOW (ref 36.0–46.0)
HCT: 26 % — ABNORMAL LOW (ref 36.0–46.0)
Hemoglobin: 9.5 g/dL — ABNORMAL LOW (ref 12.0–15.0)
Hemoglobin: 9.9 g/dL — ABNORMAL LOW (ref 12.0–15.0)
Hemoglobin: 8.8 g/dL — ABNORMAL LOW (ref 12.0–15.0)
O2 Saturation: 92 %
O2 Saturation: 93 %
O2 Saturation: 96 %
Patient temperature: 101.8
Potassium: 4.6 mmol/L (ref 3.5–5.1)
Potassium: 4.6 mmol/L (ref 3.5–5.1)
Potassium: 4.8 mmol/L (ref 3.5–5.1)
Sodium: 148 mmol/L — ABNORMAL HIGH (ref 135–145)
Sodium: 147 mmol/L — ABNORMAL HIGH (ref 135–145)
Sodium: 147 mmol/L — ABNORMAL HIGH (ref 135–145)
TCO2: 31 mmol/L (ref 22–32)
TCO2: 30 mmol/L (ref 22–32)
TCO2: 30 mmol/L (ref 22–32)
pCO2 arterial: 49.4 mmHg — ABNORMAL HIGH (ref 32.0–48.0)
pCO2 arterial: 56 mmHg — ABNORMAL HIGH (ref 32.0–48.0)
pCO2 arterial: 57.3 mmHg — ABNORMAL HIGH (ref 32.0–48.0)
pH, Arterial: 7.385 (ref 7.350–7.450)
pH, Arterial: 7.308 — ABNORMAL LOW (ref 7.350–7.450)
pH, Arterial: 7.313 — ABNORMAL LOW (ref 7.350–7.450)
pO2, Arterial: 65 mmHg — ABNORMAL LOW (ref 83.0–108.0)
pO2, Arterial: 76 mmHg — ABNORMAL LOW (ref 83.0–108.0)
pO2, Arterial: 102 mmHg (ref 83.0–108.0)

## 2019-03-18 LAB — BASIC METABOLIC PANEL
Anion gap: 22 — ABNORMAL HIGH (ref 5–15)
Anion gap: 18 — ABNORMAL HIGH (ref 5–15)
BUN: 125 mg/dL — ABNORMAL HIGH (ref 6–20)
BUN: 134 mg/dL — ABNORMAL HIGH (ref 6–20)
CO2: 27 mmol/L (ref 22–32)
CO2: 26 mmol/L (ref 22–32)
Calcium: 5.9 mg/dL — CL (ref 8.9–10.3)
Calcium: 6.2 mg/dL — CL (ref 8.9–10.3)
Chloride: 103 mmol/L (ref 98–111)
Chloride: 106 mmol/L (ref 98–111)
Creatinine, Ser: 3.19 mg/dL — ABNORMAL HIGH (ref 0.44–1.00)
Creatinine, Ser: 3.58 mg/dL — ABNORMAL HIGH (ref 0.44–1.00)
GFR calc Af Amer: 18 mL/min — ABNORMAL LOW (ref >60)
GFR calc Af Amer: 15 mL/min — ABNORMAL LOW (ref >60)
GFR calc non Af Amer: 15 mL/min — ABNORMAL LOW (ref >60)
GFR calc non Af Amer: 13 mL/min — ABNORMAL LOW (ref >60)
Glucose, Bld: 210 mg/dL — ABNORMAL HIGH (ref 70–99)
Glucose, Bld: 127 mg/dL — ABNORMAL HIGH (ref 70–99)
Potassium: 4.6 mmol/L (ref 3.5–5.1)
Potassium: 5 mmol/L (ref 3.5–5.1)
Sodium: 152 mmol/L — ABNORMAL HIGH (ref 135–145)
Sodium: 150 mmol/L — ABNORMAL HIGH (ref 135–145)

## 2019-03-18 LAB — GLUCOSE, CAPILLARY
Glucose-Capillary: 105 mg/dL — ABNORMAL HIGH (ref 70–99)
Glucose-Capillary: 116 mg/dL — ABNORMAL HIGH (ref 70–99)
Glucose-Capillary: 143 mg/dL — ABNORMAL HIGH (ref 70–99)
Glucose-Capillary: 149 mg/dL — ABNORMAL HIGH (ref 70–99)
Glucose-Capillary: 170 mg/dL — ABNORMAL HIGH (ref 70–99)
Glucose-Capillary: 245 mg/dL — ABNORMAL HIGH (ref 70–99)
Glucose-Capillary: 252 mg/dL — ABNORMAL HIGH (ref 70–99)
Glucose-Capillary: 292 mg/dL — ABNORMAL HIGH (ref 70–99)
Glucose-Capillary: <10 mg/dL — CL (ref 70–99)

## 2019-03-18 LAB — CBC
HCT: 31.9 % — ABNORMAL LOW (ref 36.0–46.0)
Hemoglobin: 9.8 g/dL — ABNORMAL LOW (ref 12.0–15.0)
MCH: 30.3 pg (ref 26.0–34.0)
MCHC: 30.7 g/dL (ref 30.0–36.0)
MCV: 98.8 fL (ref 80.0–100.0)
Platelets: 126 10*3/uL — ABNORMAL LOW (ref 150–400)
RBC: 3.23 MIL/uL — ABNORMAL LOW (ref 3.87–5.11)
RDW: 15.9 % — ABNORMAL HIGH (ref 11.5–15.5)
WBC: 12.6 10*3/uL — ABNORMAL HIGH (ref 4.0–10.5)
nRBC: 0 % (ref 0.0–0.2)

## 2019-03-18 LAB — MAGNESIUM: Magnesium: 2.5 mg/dL — ABNORMAL HIGH (ref 1.7–2.4)

## 2019-03-18 LAB — PHOSPHORUS: Phosphorus: 8.1 mg/dL — ABNORMAL HIGH (ref 2.5–4.6)

## 2019-03-18 MED ORDER — SODIUM CHLORIDE 0.9 % IV SOLN
2.0000 g | Freq: Once | INTRAVENOUS | Status: AC
  Administered 2019-03-18: 2 g via INTRAVENOUS
  Filled 2019-03-18: qty 20

## 2019-03-18 MED ORDER — HYDROCORTISONE NA SUCCINATE PF 100 MG IJ SOLR
50.0000 mg | Freq: Four times a day (QID) | INTRAMUSCULAR | Status: DC
  Administered 2019-03-18 – 2019-03-21 (×11): 50 mg via INTRAVENOUS
  Filled 2019-03-18 (×11): qty 2

## 2019-03-18 MED ORDER — CALCIUM GLUCONATE-NACL 2-0.675 GM/100ML-% IV SOLN
2.0000 g | Freq: Once | INTRAVENOUS | Status: AC
  Administered 2019-03-18: 2000 mg via INTRAVENOUS
  Filled 2019-03-18: qty 100

## 2019-03-18 MED ORDER — LEVETIRACETAM IN NACL 500 MG/100ML IV SOLN
500.0000 mg | Freq: Two times a day (BID) | INTRAVENOUS | Status: DC
  Administered 2019-03-18 – 2019-03-19 (×4): 500 mg via INTRAVENOUS
  Filled 2019-03-18 (×3): qty 100

## 2019-03-18 MED ORDER — FAMOTIDINE 40 MG/5ML PO SUSR
20.0000 mg | Freq: Every day | ORAL | Status: DC
  Administered 2019-03-19 – 2019-03-20 (×2): 20 mg
  Filled 2019-03-18 (×2): qty 2.5

## 2019-03-18 MED ORDER — LACTATED RINGERS IV BOLUS
500.0000 mL | Freq: Once | INTRAVENOUS | Status: AC
  Administered 2019-03-18: 500 mL via INTRAVENOUS

## 2019-03-18 NOTE — Progress Notes (Signed)
NAME:  Deborah Bond, MRN:  [id number], DOB:  02-12-1961, LOS: 10 ADMISSION DATE:  03/17/2019, CONSULTATION DATE:  12/10 REFERRING MD:  Bonner Puna, CHIEF COMPLAINT:  Dyspnea  Brief History   58 y/o female admitted on 12/8 with dyspnea, cough and hypoxemia due to COVID 19.  Moved to [facility] on 12/9.  Early in the AM on 12/10 she was moved to the ICU for worsening hypoxemia.    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  PF 66 > proned.  Pulse dose steroids added for possible DAH.  Worsening shock overnight despite max pressors, worsening oxygenation and ventilation despite proning. 12/22 Increased PEEP and TV to 8cc/kg for oxygenation 12/23 Prone for P:F 65 Consults:  PCCM NSGY  Procedures:  12/10 Arterial line>> 12/19 Right frontal ventriculostomy>>  Significant Diagnostic Tests:  12/19 CT Head > Acute predominantly subdural hematoma in the left posterior fossa measuring up to 3.0 cm. Suspected small intraparenchymal hemorrhage within the left cerebellum as well. Effacement of the fourth ventricle and brainstem with early hydrocephalus. Patchy hypodensity in both occipital lobes, concerning for infarct. Given COVID-19 infection, constellation of findings may be related to underlying venous thrombosis. 12/20 CT Head > Interval placement of right frontal approach ventriculostomy with tip positioned within the right lateral ventricle near the foramen of Monro. Previously seen hydrocephalus has resolved, with interval decompression of the ventricular system. No significant interval change in size and morphology of acute left posterior fossa  hemorrhage with similar regional mass effect.  No other new acute intracranial abnormality. 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 CXR 12/21 > worsening bilateral airspace opacities  Micro Data:  12/8 POC SARS COV 2 > positive 12/8 blood >Negative  MRSA surveillance negative  12/21 Trach Asp - Abundant S. Aureus and rare proteus mirabilia 12/23 BCx - pending  Antimicrobials/COVID Rx:  12/8 ceftriaxone x1 12/8 azithromycin x1 12/8 remdesivir > 12/12 12/8 decadron >12/17  12/9 tocilizumab x1 12/9 CCP x1 12/21 Vanc > 12/21 Zosyn > Interim history/subjective:  Critically ill this morning on vent settings with FIO2 100% PEEP 20 TV - 8cc/kg and on multiple vasopressors  Objective   Blood pressure (!) 103/55, pulse (!) 120, temperature 98.9 F (37.2 C), temperature source Axillary, resp. rate (!) 35, height 5\' 3"  (1.6 m), weight 105.6 kg, SpO2 90 %. CVP:  [19 mmHg-22 mmHg] 20 mmHg  Vent Mode: PRVC FiO2 (%):  [100 %] 100 % Set Rate:  [35 bmp] 35 bmp Vt Set:  [420 mL] 420 mL PEEP:  [18 cmH20-20 cmH20] 18 cmH20 Plateau Pressure:  [36 cmH20-39 cmH20] 39 cmH20   Intake/Output Summary (Last 24 hours) at 03/18/2019 1008 Last data filed at 03/18/2019 0800 Gross per 24 hour  Intake 7085.18 ml  Output 232 ml  Net 6853.18 ml   Filed Weights   03/12/19 0421 03/13/19 0417 03/14/19 0439  Weight: 105.9 kg 104.9 kg 105.6 kg   Physical Exam: General: Critically ill-appearing, sedated HENT: R EVD in place, [place], AT, ETT in place Eyes: No corneal reflex, no scleral icterus Respiratory: Vented breath sounds Cardiovascular: Tachycardic, normal rhythm on telemetry GI: BS+, soft, nontender Extremities:Non-pitting edema in LUE,-tenderness  GU: Foley in place  Assessment & Plan:   Severe ARDS due to COVID 19 pneumonia with respiratory failure S/p course of remdesivir and decadron and tocilizumab  -Reviewed ABG. Plan to  attempt proning again -Full vent support. FIO2 100% TV 8cc/kg RR 35.  -Reduced PEEP from 18 to 16 for elevated Pplat 40s -Goal saturations 88-95% or PO2 55-80 -PRN ABG -Hold on diuresis for now -Continue Zinc, vitamin C . -PAD protocol for RASS goal -4 and -5: Fentanyl gtt  Septic shock secondary to COVID pneumonia with co-comitant bacterial pneumonia -On levophed, neo and vasopressin for MAP goal >65 -Continue broad-spectrum antibiotics -Obtain blood cultures -Start stress dose steroids after pulse dose (given for possible DAH)  Possible DAH Bloody secretions noted in ETT with extensive bilateral airspace disease. -Continue pulse dose steroids through 12/23.  S.aureus and proteus mirabilis HCAP - Continue vanc / zosyn. - Follow-up final cultures  Acute renal failure - Monitor UOP/Cr - Hold on diuresis - DC bicarb gtt for improved acidosis - Primary team will continue discussions of GOC. Family is considering dialysis  Hypoglycemia. - Continue D10.  Hypocalcemia - 2g calcium gluconate once  Hypothyroidism -Synthroid  Depression -SSRI  Encephalopathy 2/2 posterior fossa hemorrhage s/p R frontal ventriculostomy for obstructive hydrocephalus  -Neuroprotective measures: Maintain euthermia, euglycemia, eunatremia, normoxia as able, pCO2 35-40 as able, nutrition and bowel regimen, seizure precautions, head of bed elevated -Neurology following. Appreciate input. Difficult to assess and prognosticate neuro status due to critical illness. Recommend repeat CT head when patient stable -EVD per neurosurgery. Appreciate input. Due to her critical illness, no additional intervention recommended -Serial neuro exams -AED: Scheduled Keppra   Best practice:  Diet: Hold TF Pain/Anxiety/Delirium protocol (if indicated): Fentanyl gtt / Midazolam gtt.  RASS goal -4. VAP protocol (if indicated): yes DVT prophylaxis: SCDs GI prophylaxis: famotidine Glucose control: see above Mobility:  bed rest Code Status: Limited - no CPR / defib. Family discussing dialysis Family Communication: Updated husband and son 12/23 Disposition: remain in ICU  The patient is critically ill with multiple organ systems failure and requires high complexity decision making for assessment and support, frequent evaluation and titration of therapies, application of advanced monitoring technologies and extensive interpretation of multiple databases.   Critical Care Time devoted to patient care services described in this note is 60 Minutes. This time reflects time of care of this signee Dr. Mechele Collin.   Mechele Collin, M.D. [facility] Pulmonary/Critical Care Medicine 03/18/2019 10:09 AM   Please see Amion for pager number to reach on-call Pulmonary and Critical Care Team.

## 2019-03-18 NOTE — Progress Notes (Signed)
STROKE TEAM PROGRESS NOTE      INTERVAL HISTORY Deborah Bond  And Dr Raynelle Fanning are at the bedside.  Deborah condition remains poor.  She is still on 100% FiO2 and maximum ventilatory support as well as antibiotics and pressors.  Neurological exam is limited due to sedation but she remains poorly responsive to noxious stimuli without any purposeful movements.  EVD draining minimally with pop-off at 15 cm She remains mildly hypernatremic with worsening renal function OBJECTIVE Vitals:   03/18/19 1200 03/18/19 1300 03/18/19 1400 03/18/19 1502  BP:      Pulse: (!) 126 (!) 113 (!) 112   Resp: (!) 35 19 (!) 35   Temp:      TempSrc:      SpO2: (!) 88% 95% 91% 92%  Weight:      Height:        CBC:  Recent Labs  Lab 03/17/19 0500 03/18/19 0616 03/18/19 0936  WBC 2.6* 12.6*  --   HGB 10.4* 9.8* 9.5*  HCT 36.5 31.9* 28.0*  MCV 104.6* 98.8  --   PLT 169 126*  --     Basic Metabolic Panel:  Recent Labs  Lab 03/17/19 0500 03/18/19 0616 03/18/19 0936  NA 158* 152* 148*  K 4.4 4.6 4.6  CL 111 103  --   CO2 29 27  --   GLUCOSE 104* 210*  --   BUN 89* 125*  --   CREATININE 2.14* 3.19*  --   CALCIUM 6.5* 5.9*  --   MG 2.6* 2.5*  --   PHOS  --  8.1*  --     Lipid Panel:     Component Value Date/Time   CHOL 179 12/09/2018 0917   TRIG 228 (H) 03/14/2019 1838   HDL 52 12/09/2018 0917   CHOLHDL 3.4 12/09/2018 0917   LDLCALC 103 (H) 12/09/2018 0917   HgbA1c:  Lab Results  Component Value Date   HGBA1C 5.7 12/09/2018   Urine Drug Screen: No results found for: LABOPIA, COCAINSCRNUR, LABBENZ, AMPHETMU, THCU, LABBARB  Alcohol Level No results found for: [facility]  IMAGING   DG Chest Port 1 View  Result Date: 03/17/2019 CLINICAL DATA:  ET tube, COVID positive.  Respiratory failure. EXAM: PORTABLE CHEST 1 VIEW COMPARISON:  03/16/2019 FINDINGS: Endotracheal tube, right PICC line and NG tube remain in place, unchanged. Severe diffuse bilateral airspace disease again noted, minimally improved  in the right upper lobe, otherwise no change. No visible effusions or pneumothorax. No acute bony abnormality. IMPRESSION: Severe diffuse bilateral airspace disease with minimal improvement in the right upper lobe. Otherwise no change. Electronically Signed   By: Rolm Baptise M.D.   On: 03/17/2019 09:02   DG Chest Port 1 View  Result Date: 03/16/2019 CLINICAL DATA:  58 year old female with respiratory distress. Positive COVID-19. EXAM: PORTABLE CHEST 1 VIEW COMPARISON:  Earlier radiograph dated 03/16/2019. FINDINGS: Endotracheal tube above the carina, right-sided PICC in similar position, and enteric tube extending below the diaphragm with tip beyond the inferior margin of the image. Overall worsening of the bilateral airspace opacities compared to the earlier radiograph. No pneumothorax. No acute osseous pathology. IMPRESSION: 1. Overall worsening of the bilateral airspace opacities compared to the earlier radiograph. 2. Stable positioning of the support lines and tubes. Electronically Signed   By: Deborah Bond M.D.   On: 03/16/2019 23:57   DG Chest Port 1 View  Result Date: 03/16/2019 CLINICAL DATA:  58 year old female with history of respiratory failure. EXAM: PORTABLE CHEST 1  VIEW COMPARISON:  Chest x-ray 03/14/2019. FINDINGS: An endotracheal tube is in place with tip 2.5 cm above the carina. There is a right upper extremity PICC with tip terminating in the right atrium. A nasogastric tube is seen extending into the stomach, however, the tip of the nasogastric tube extends below the lower margin of the image. Spinal cord stimulator projecting over the lower thoracic and lumbar regions. Patchy multifocal ill-defined ground-glass and consolidative opacities throughout the lungs bilaterally, most severe throughout the mid to lower lungs where there is also diffuse interstitial prominence. Overall, aeration has worsened, with multiple air bronchograms noted on today's examination. No definite pleural  effusions. No pneumothorax. Cardiac silhouette is largely obscured. Aortic atherosclerosis. IMPRESSION: 1. Support apparatus, as above. 2. Worsening aeration in the lungs, compatible with severe progressive multilobar bilateral pneumonia. 3. Aortic atherosclerosis. Electronically Signed   By: Deborah Bond M.D.   On: 03/16/2019 16:42   ECG - SR rate. (See cardiology reading for complete details)  PHYSICAL EXAM Blood pressure 108/64, pulse (!) 112, temperature 98.9 F (37.2 C), temperature source Axillary, resp. rate (!) 35, height _0  (1.6 m), weight 105.6 kg, SpO2 92 %. Obese middle-aged Caucasian lady who is sedated intubated in respiratory distress ventilatory support for respiratory failure.  And on pressors for hemodynamic support. Afebrile. Head is nontraumatic. Neck is supple without bruit.    Cardiac exam no murmur or gallop. Lungs are clear to auscultation. Distal pulses are well felt. Neurological Exam : limited by sedation: Patient is intubated.  Comatose and unresponsive.  Eyes are closed.  No response to sternal rub or nailbed pressure.  Eyes in primary position pupils 3 mm not reactive.  There is conjunctival swelling in both eyes.  Corneal reflexes are absent.  Doll's eye reflexes are absent.  Very weak cough and gag.  Not breathing above ventilator settings.  No spontaneous extremity motor movements.  No response to nailbed pressure in either extremities.  Plantars not elicitable.  HOME MEDICATIONS:  Medications Prior to Admission  Medication Sig Dispense Refill  . amoxicillin-clavulanate (AUGMENTIN) 875-125 MG tablet Take 1 tablet by mouth 2 (two) times daily. Take all of this medication 20 tablet 0  . aspirin EC 81 MG tablet Take 81 mg by mouth daily.    Marland Kitchen atorvastatin (LIPITOR) 20 MG tablet TAKE ONE (1) TABLET EACH DAY (Patient taking differently: Take 20 mg by mouth daily. ) 90 tablet 3  . Calcium Carbonate-Vitamin D (CALCIUM 600+D) 600-200 MG-UNIT TABS Take by mouth.    .  diclofenac sodium (VOLTAREN) 1 % GEL Apply 2 g topically 4 (four) times daily. 100 g 3  . escitalopram (LEXAPRO) 20 MG tablet Take 1 tablet (20 mg total) by mouth daily. 90 tablet 3  . fluticasone (FLONASE) 50 MCG/ACT nasal spray Place 1 spray into both nostrils daily.     Marland Kitchen ibuprofen (ADVIL) 800 MG tablet TAKE ONE TABLET 3 TIMES A DAY AS NEEDED. (Patient taking differently: Take 800 mg by mouth 3 (three) times daily as needed for fever, headache or mild pain. ) 90 tablet 5  . levothyroxine (SYNTHROID) 100 MCG tablet TAKE ONE (1) TABLET EACH DAY (Patient taking differently: Take 100 mcg by mouth daily before breakfast. ) 90 tablet 3  . Multiple Vitamins-Minerals (HAIR SKIN AND NAILS FORMULA) TABS Take 1 tablet by mouth daily.    . ondansetron (ZOFRAN) 4 MG tablet Take 4 mg by mouth every 8 (eight) hours as needed for nausea or vomiting (DISSOLVE IN THE MOUTH).     Marland Kitchen  predniSONE (DELTASONE) 10 MG tablet Take 5 daily for 2 days followed by 4,3,2 and 1 for 2 days each. 30 tablet 0  . UNABLE TO FIND Med Name: [facility] Complete        HOSPITAL MEDICATIONS:  . artificial tears  1 application Both Eyes S0Y  . chlorhexidine gluconate (MEDLINE KIT)  15 mL Mouth Rinse BID  . Chlorhexidine Gluconate Cloth  6 each Topical Daily  . [START ON 03/19/2019] famotidine  20 mg Per Tube Daily  . feeding supplement (PRO-STAT SUGAR FREE 64)  60 mL Per Tube BID  . hydrocortisone sod succinate (SOLU-CORTEF) inj  50 mg Intravenous Q6H  . insulin aspart  0-20 Units Subcutaneous Q4H  . levothyroxine  100 mcg Per Tube Q0600  . mouth rinse  15 mL Mouth Rinse 10 times per day  . polyethylene glycol  17 g Per Tube BID  . sodium chloride flush  10-40 mL Intracatheter Q12H  . vitamin C  500 mg Per Tube Daily  . zinc sulfate  220 mg Per Tube Daily    ALLERGIES No Known Allergies  ASSESSMENT/PLAN Ms. Deborah Bond is a 57 y.o. female admitted with ARDS d/t COVID pneumonia. On 12/19 a large posterior fossa subdural  hemorrhage was found with hydrocephalus. Emergent EVD placed by neurosurgery at that time. She has since further declined with septic shock and is on max dose pressors.   SDH and large posterior fossa hemorrhage with significant brain compression and significant cerebral edema.  Etiology of subdural hemorrhage likely related to anticoagulation effect of Lovenox and CT scan findings also suggest posterior reversible encephalopathy syndrome though MRI would be more diagnostic.  Resultant  Comatose poor neurological exam which is limited by sedation and poor medical condition as well  Code Stroke CT Head -    ASPECTS- n/a  CT head - acute SDH in left posterior fossa with IPH. Effacement of 4th ventricle and brainstem w/hypdrocephalus. Patchy hypodensity in both occipital lobes.   MRV- did not show underlying venous thrombosis.  MRI head- n/a  MRA head -n/a  CTA H&N - not done  CT Perfusion- n/a  Carotid Doppler -  2D Echo - 50% EF. Mild LVH.  Sars Corona Virus 2  POSITIVE  LDL - 103    Component Value Date/Time   LDLCALC 103 (H) 12/09/2018 0917    HgbA1c - 5.7  UDS not done  VTE prophylaxis - SCDs d/t bleed Diet  Diet Order    None      ASA prior to admission, now on none  Ongoing aggressive stroke risk factor management  Therapy recommendations:  pending  Disposition:  Pending  Hypertension  Home BP meds: none   Current BP meds: Pressors  Unstable HYPOTENSION at this time . Long-term BP goal normotensive  Hyperlipidemia  Home Lipid lowering medication: Lipitor 95m  LDL 102, goal < 70  Current lipid lowering medication: Lipitor 20 mg daily   Continue statin at discharge  Diabetes  Home diabetic meds: none   Current diabetic meds: SSI   HgbA1c 6, goal < 7.0 Recent Labs    03/17/19 2127 03/18/19 0134 03/18/19 0427  GLUCAP 121* 149* 170*    Other Stroke Risk Factors  none   Other Active Problems  COVID + Acute hypoxic respiratory  arrest  Septic shock  ARDS d/t Covid pna  Hospital day # [id number]Metzger-Cihelka, ARNP-C, ANVP-BC Pager: [phone]  Patient's neurological and general medical condition appears to be very poor  and declining and is probably not compatible with life.  A large posterior fossa subdural hemorrhage is likely a result of effect of high-dose Lovenox as well as possible posterior reversible encephalopathy syndrome given CT scan changes of white matter edema in the parieto-occipital lobes.  Recommend strict control of blood pressure with systolic goal below 069.  Patient's husband is thinking about withdrawal of care given Deborah poor general prognosis with may be appropriate in this situation.  Long discussion with Dr. Loanne Drilling critical care medicine and answered questions. This patient is critically ill and at significant risk of neurological worsening, death and care requires constant monitoring of vital signs, hemodynamics,respiratory and cardiac monitoring, extensive review of multiple databases, frequent neurological assessment, discussion with family, other specialists and medical decision making of high complexity.I have made any additions or clarifications directly to the above note.This critical care time does not reflect procedure time, or teaching time or supervisory time of PA/NP/Med Resident etc but could involve care discussion time.  I spent 30 minutes of neurocritical care time  in the care of  this patient.      Antony Contras, MD Medical Director [facility] Pager: [phone] 03/18/2019 3:34 PM  To contact Stroke Continuity provider, please refer to [email or web address] After hours, contact General Neurology

## 2019-03-18 NOTE — Progress Notes (Signed)
Patient still requiring maximal pressors for hemodynamic support.  Respiratory failure continues to be severe on 100% O2 with high PEEP.  Ventriculostomy continues to function.  Renal failure worsening.  Patient with worsening overall condition and ongoing multiorgan failure.  Continue ventriculostomy for now.  No other interventions appropriate from our standpoint.

## 2019-03-18 NOTE — Procedures (Signed)
Arterial Catheter Insertion Procedure Note Deborah Bond [id number] [birth year]  Procedure: Insertion of Arterial Catheter  Indications: Blood pressure monitoring and Frequent blood sampling  Procedure Details Consent: Unable to obtain consent because of emergent medical necessity. Time Out: Verified patient identification, verified procedure, site/side was marked, verified correct patient position, special equipment/implants available, medications/allergies/relevent history reviewed, required imaging and test results available.  Performed  Maximum sterile technique was used including antiseptics, cap, gloves, gown, hand hygiene, mask and sheet. Skin prep: Chlorhexidine; local anesthetic administered 20 gauge catheter was inserted into right radial artery using the Seldinger technique. ULTRASOUND GUIDANCE USED: NO Evaluation Blood flow good; BP tracing good. Complications: No apparent complications.   Deborah Bond 03/18/2019

## 2019-03-18 NOTE — Progress Notes (Signed)
[facility] [facility] Progress Note Patient Name: Kohana Amble DOB: 10/11/1960 MRN: [id number]   Date of Service  03/18/2019  HPI/Events of Note  Ca ++  0.88  eICU Interventions  Calcium gluconate 2 gm iv x 1        Ugo Thoma U Maybree Riling 03/18/2019, 10:11 PM

## 2019-03-18 NOTE — Progress Notes (Signed)
CRITICAL VALUE ALERT  Critical Value:  CALCIUM6.2  Date & Time Notied:  03/18/2019 2145  Provider Notified: Warren Lacy rn   Orders Received/Actions taken: AWAITING NEW ORDERS

## 2019-03-19 ENCOUNTER — Inpatient Hospital Stay (HOSPITAL_COMMUNITY): Payer: BC Managed Care – PPO

## 2019-03-19 DIAGNOSIS — J189 Pneumonia, unspecified organism: Secondary | ICD-10-CM

## 2019-03-19 DIAGNOSIS — A4101 Sepsis due to Methicillin susceptible Staphylococcus aureus: Secondary | ICD-10-CM

## 2019-03-19 LAB — POCT I-STAT 7, (LYTES, BLD GAS, ICA,H+H)
Acid-Base Excess: 3 mmol/L — ABNORMAL HIGH (ref 0.0–2.0)
Acid-Base Excess: 2 mmol/L (ref 0.0–2.0)
Acid-Base Excess: 1 mmol/L (ref 0.0–2.0)
Acid-base deficit: 2 mmol/L (ref 0.0–2.0)
Bicarbonate: 29 mmol/L — ABNORMAL HIGH (ref 20.0–28.0)
Bicarbonate: 28.3 mmol/L — ABNORMAL HIGH (ref 20.0–28.0)
Bicarbonate: 29.8 mmol/L — ABNORMAL HIGH (ref 20.0–28.0)
Bicarbonate: 28 mmol/L (ref 20.0–28.0)
Bicarbonate: 27.5 mmol/L (ref 20.0–28.0)
Calcium, Ion: 0.84 mmol/L — CL (ref 1.15–1.40)
Calcium, Ion: 0.85 mmol/L — CL (ref 1.15–1.40)
Calcium, Ion: 0.83 mmol/L — CL (ref 1.15–1.40)
Calcium, Ion: 0.87 mmol/L — CL (ref 1.15–1.40)
Calcium, Ion: 0.87 mmol/L — CL (ref 1.15–1.40)
HCT: 25 % — ABNORMAL LOW (ref 36.0–46.0)
HCT: 25 % — ABNORMAL LOW (ref 36.0–46.0)
HCT: 24 % — ABNORMAL LOW (ref 36.0–46.0)
HCT: 24 % — ABNORMAL LOW (ref 36.0–46.0)
HCT: 27 % — ABNORMAL LOW (ref 36.0–46.0)
Hemoglobin: 8.5 g/dL — ABNORMAL LOW (ref 12.0–15.0)
Hemoglobin: 8.5 g/dL — ABNORMAL LOW (ref 12.0–15.0)
Hemoglobin: 8.2 g/dL — ABNORMAL LOW (ref 12.0–15.0)
Hemoglobin: 8.2 g/dL — ABNORMAL LOW (ref 12.0–15.0)
Hemoglobin: 9.2 g/dL — ABNORMAL LOW (ref 12.0–15.0)
O2 Saturation: 79 %
O2 Saturation: 86 %
O2 Saturation: 87 %
O2 Saturation: 81 %
O2 Saturation: 77 %
Patient temperature: 98.8
Patient temperature: 98.8
Patient temperature: 100.4
Patient temperature: 98.6
Potassium: 5.4 mmol/L — ABNORMAL HIGH (ref 3.5–5.1)
Potassium: 5.1 mmol/L (ref 3.5–5.1)
Potassium: 5.5 mmol/L — ABNORMAL HIGH (ref 3.5–5.1)
Potassium: 5.6 mmol/L — ABNORMAL HIGH (ref 3.5–5.1)
Potassium: 5.1 mmol/L (ref 3.5–5.1)
Sodium: 145 mmol/L (ref 135–145)
Sodium: 145 mmol/L (ref 135–145)
Sodium: 145 mmol/L (ref 135–145)
Sodium: 143 mmol/L (ref 135–145)
Sodium: 143 mmol/L (ref 135–145)
TCO2: 30 mmol/L (ref 22–32)
TCO2: 30 mmol/L (ref 22–32)
TCO2: 32 mmol/L (ref 22–32)
TCO2: 30 mmol/L (ref 22–32)
TCO2: 30 mmol/L (ref 22–32)
pCO2 arterial: 50.1 mmHg — ABNORMAL HIGH (ref 32.0–48.0)
pCO2 arterial: 51.7 mmHg — ABNORMAL HIGH (ref 32.0–48.0)
pCO2 arterial: 71.4 mmHg (ref 32.0–48.0)
pCO2 arterial: 69.1 mmHg (ref 32.0–48.0)
pCO2 arterial: 76.2 mmHg (ref 32.0–48.0)
pH, Arterial: 7.371 (ref 7.350–7.450)
pH, Arterial: 7.347 — ABNORMAL LOW (ref 7.350–7.450)
pH, Arterial: 7.228 — ABNORMAL LOW (ref 7.350–7.450)
pH, Arterial: 7.22 — ABNORMAL LOW (ref 7.350–7.450)
pH, Arterial: 7.166 — CL (ref 7.350–7.450)
pO2, Arterial: 46 mmHg — ABNORMAL LOW (ref 83.0–108.0)
pO2, Arterial: 56 mmHg — ABNORMAL LOW (ref 83.0–108.0)
pO2, Arterial: 66 mmHg — ABNORMAL LOW (ref 83.0–108.0)
pO2, Arterial: 59 mmHg — ABNORMAL LOW (ref 83.0–108.0)
pO2, Arterial: 54 mmHg — ABNORMAL LOW (ref 83.0–108.0)

## 2019-03-19 LAB — RENAL FUNCTION PANEL
Albumin: 1.3 g/dL — ABNORMAL LOW (ref 3.5–5.0)
Anion gap: 19 — ABNORMAL HIGH (ref 5–15)
BUN: 156 mg/dL — ABNORMAL HIGH (ref 6–20)
CO2: 23 mmol/L (ref 22–32)
Calcium: 6.3 mg/dL — CL (ref 8.9–10.3)
Chloride: 105 mmol/L (ref 98–111)
Creatinine, Ser: 4.35 mg/dL — ABNORMAL HIGH (ref 0.44–1.00)
GFR calc Af Amer: 12 mL/min — ABNORMAL LOW (ref >60)
GFR calc non Af Amer: 10 mL/min — ABNORMAL LOW (ref >60)
Glucose, Bld: 136 mg/dL — ABNORMAL HIGH (ref 70–99)
Phosphorus: 9.8 mg/dL — ABNORMAL HIGH (ref 2.5–4.6)
Potassium: 5.9 mmol/L — ABNORMAL HIGH (ref 3.5–5.1)
Sodium: 147 mmol/L — ABNORMAL HIGH (ref 135–145)

## 2019-03-19 LAB — BASIC METABOLIC PANEL
Anion gap: 21 — ABNORMAL HIGH (ref 5–15)
Anion gap: 19 — ABNORMAL HIGH (ref 5–15)
BUN: 143 mg/dL — ABNORMAL HIGH (ref 6–20)
BUN: 151 mg/dL — ABNORMAL HIGH (ref 6–20)
CO2: 25 mmol/L (ref 22–32)
CO2: 26 mmol/L (ref 22–32)
Calcium: 6.3 mg/dL — CL (ref 8.9–10.3)
Calcium: 6.5 mg/dL — ABNORMAL LOW (ref 8.9–10.3)
Chloride: 103 mmol/L (ref 98–111)
Chloride: 104 mmol/L (ref 98–111)
Creatinine, Ser: 3.87 mg/dL — ABNORMAL HIGH (ref 0.44–1.00)
Creatinine, Ser: 4.27 mg/dL — ABNORMAL HIGH (ref 0.44–1.00)
GFR calc Af Amer: 14 mL/min — ABNORMAL LOW (ref >60)
GFR calc Af Amer: 12 mL/min — ABNORMAL LOW (ref >60)
GFR calc non Af Amer: 12 mL/min — ABNORMAL LOW (ref >60)
GFR calc non Af Amer: 11 mL/min — ABNORMAL LOW (ref >60)
Glucose, Bld: 145 mg/dL — ABNORMAL HIGH (ref 70–99)
Glucose, Bld: 168 mg/dL — ABNORMAL HIGH (ref 70–99)
Potassium: 5.2 mmol/L — ABNORMAL HIGH (ref 3.5–5.1)
Potassium: 5.9 mmol/L — ABNORMAL HIGH (ref 3.5–5.1)
Sodium: 149 mmol/L — ABNORMAL HIGH (ref 135–145)
Sodium: 149 mmol/L — ABNORMAL HIGH (ref 135–145)

## 2019-03-19 LAB — POCT I-STAT EG7
Bicarbonate: 29.3 mmol/L — ABNORMAL HIGH (ref 20.0–28.0)
Calcium, Ion: 0.87 mmol/L — CL (ref 1.15–1.40)
HCT: 27 % — ABNORMAL LOW (ref 36.0–46.0)
Hemoglobin: 9.2 g/dL — ABNORMAL LOW (ref 12.0–15.0)
O2 Saturation: 87 %
Patient temperature: 100.7
Potassium: 5.6 mmol/L — ABNORMAL HIGH (ref 3.5–5.1)
Sodium: 145 mmol/L (ref 135–145)
TCO2: 32 mmol/L (ref 22–32)
pCO2, Ven: 85.7 mmHg (ref 44.0–60.0)
pH, Ven: 7.149 — CL (ref 7.250–7.430)
pO2, Ven: 75 mmHg — ABNORMAL HIGH (ref 32.0–45.0)

## 2019-03-19 LAB — CBC
HCT: 23.6 % — ABNORMAL LOW (ref 36.0–46.0)
Hemoglobin: 7.3 g/dL — ABNORMAL LOW (ref 12.0–15.0)
MCH: 30.5 pg (ref 26.0–34.0)
MCHC: 30.9 g/dL (ref 30.0–36.0)
MCV: 98.7 fL (ref 80.0–100.0)
Platelets: 67 10*3/uL — ABNORMAL LOW (ref 150–400)
RBC: 2.39 MIL/uL — ABNORMAL LOW (ref 3.87–5.11)
RDW: 16.2 % — ABNORMAL HIGH (ref 11.5–15.5)
WBC: 16.5 10*3/uL — ABNORMAL HIGH (ref 4.0–10.5)
nRBC: 0 % (ref 0.0–0.2)

## 2019-03-19 LAB — LACTIC ACID, PLASMA
Lactic Acid, Venous: 1.8 mmol/L (ref 0.5–1.9)
Lactic Acid, Venous: 2.1 mmol/L (ref 0.5–1.9)

## 2019-03-19 LAB — GLUCOSE, CAPILLARY
Glucose-Capillary: 132 mg/dL — ABNORMAL HIGH (ref 70–99)
Glucose-Capillary: 135 mg/dL — ABNORMAL HIGH (ref 70–99)
Glucose-Capillary: 152 mg/dL — ABNORMAL HIGH (ref 70–99)
Glucose-Capillary: 125 mg/dL — ABNORMAL HIGH (ref 70–99)
Glucose-Capillary: 119 mg/dL — ABNORMAL HIGH (ref 70–99)

## 2019-03-19 LAB — PROTIME-INR
INR: 1.3 — ABNORMAL HIGH (ref 0.8–1.2)
Prothrombin Time: 15.9 seconds — ABNORMAL HIGH (ref 11.4–15.2)

## 2019-03-19 LAB — PHOSPHORUS: Phosphorus: 9.5 mg/dL — ABNORMAL HIGH (ref 2.5–4.6)

## 2019-03-19 LAB — FIBRINOGEN: Fibrinogen: 650 mg/dL — ABNORMAL HIGH (ref 210–475)

## 2019-03-19 LAB — MAGNESIUM: Magnesium: 2.6 mg/dL — ABNORMAL HIGH (ref 1.7–2.4)

## 2019-03-19 LAB — VANCOMYCIN, RANDOM: Vancomycin Rm: 33

## 2019-03-19 MED ORDER — PRISMASOL BGK 0/2.5 32-2.5 MEQ/L IV SOLN
INTRAVENOUS | Status: DC
  Filled 2019-03-19 (×4): qty 5000

## 2019-03-19 MED ORDER — PIPERACILLIN-TAZOBACTAM 3.375 G IVPB
3.3750 g | Freq: Two times a day (BID) | INTRAVENOUS | Status: DC
  Administered 2019-03-19: 3.375 g via INTRAVENOUS
  Filled 2019-03-19: qty 50

## 2019-03-19 MED ORDER — CALCIUM GLUCONATE-NACL 2-0.675 GM/100ML-% IV SOLN
2.0000 g | Freq: Once | INTRAVENOUS | Status: AC
  Administered 2019-03-19: 2000 mg via INTRAVENOUS
  Filled 2019-03-19: qty 100

## 2019-03-19 MED ORDER — PRISMASOL BGK 0/2.5 32-2.5 MEQ/L IV SOLN
INTRAVENOUS | Status: DC
  Filled 2019-03-19 (×6): qty 5000

## 2019-03-19 MED ORDER — CISATRACURIUM BESYLATE (PF) 10 MG/5ML IV SOLN
10.0000 mg | Freq: Once | INTRAVENOUS | Status: AC
  Filled 2019-03-19: qty 5

## 2019-03-19 MED ORDER — VANCOMYCIN VARIABLE DOSE PER UNSTABLE RENAL FUNCTION (PHARMACIST DOSING): Status: DC

## 2019-03-19 MED ORDER — SODIUM CHLORIDE 0.9 % FOR CRRT
INTRAVENOUS_CENTRAL | Status: DC | PRN
  Filled 2019-03-19: qty 1000

## 2019-03-19 MED ORDER — AMIODARONE HCL IN DEXTROSE 360-4.14 MG/200ML-% IV SOLN
60.0000 mg/h | INTRAVENOUS | Status: AC
  Administered 2019-03-19: 21:00:00 60 mg/h via INTRAVENOUS

## 2019-03-19 MED ORDER — ARTIFICIAL TEARS OPHTHALMIC OINT: 1.0000 "application " | TOPICAL_OINTMENT | Freq: Three times a day (TID) | OPHTHALMIC | Status: DC

## 2019-03-19 MED ORDER — FUROSEMIDE 10 MG/ML IJ SOLN
40.0000 mg | Freq: Once | INTRAMUSCULAR | Status: AC
  Administered 2019-03-19: 07:00:00 40 mg via INTRAVENOUS
  Filled 2019-03-19: qty 4

## 2019-03-19 MED ORDER — CISATRACURIUM BESYLATE (PF) 10 MG/5ML IV SOLN
10.0000 mg | Freq: Once | INTRAVENOUS | Status: AC
  Administered 2019-03-19: 10 mg via INTRAVENOUS

## 2019-03-19 MED ORDER — VECURONIUM BROMIDE 10 MG IV SOLR
INTRAVENOUS | Status: AC
  Administered 2019-03-19: 10 mg
  Filled 2019-03-19: qty 10

## 2019-03-19 MED ORDER — CISATRACURIUM BOLUS VIA INFUSION
10.0000 mg | Freq: Once | INTRAVENOUS | Status: DC
  Filled 2019-03-19: qty 10

## 2019-03-19 MED ORDER — VECURONIUM BROMIDE 10 MG IV SOLR
0.0000 ug/kg/min | INTRAVENOUS | Status: DC
  Administered 2019-03-19: 1 ug/kg/min via INTRAVENOUS
  Administered 2019-03-20 (×2): 1.2 ug/kg/min via INTRAVENOUS
  Filled 2019-03-19 (×5): qty 100

## 2019-03-19 MED ORDER — PRISMASOL BGK 0/2.5 32-2.5 MEQ/L IV SOLN
INTRAVENOUS | Status: DC
  Filled 2019-03-19 (×24): qty 5000

## 2019-03-19 MED ORDER — VECURONIUM BOLUS VIA INFUSION
10.0000 mg | Freq: Once | INTRAVENOUS | Status: DC
  Filled 2019-03-19: qty 10

## 2019-03-19 MED ORDER — AMIODARONE IV BOLUS ONLY 150 MG/100ML
150.0000 mg | Freq: Once | INTRAVENOUS | Status: AC
  Administered 2019-03-19: 20:00:00 150 mg via INTRAVENOUS
  Filled 2019-03-19: qty 100

## 2019-03-19 MED ORDER — PIPERACILLIN-TAZOBACTAM 3.375 G IVPB 30 MIN
3.3750 g | Freq: Four times a day (QID) | INTRAVENOUS | Status: DC
  Administered 2019-03-19 – 2019-03-21 (×7): 3.375 g via INTRAVENOUS
  Filled 2019-03-19 (×17): qty 50

## 2019-03-19 MED ORDER — AMIODARONE HCL IN DEXTROSE 360-4.14 MG/200ML-% IV SOLN
30.0000 mg/h | INTRAVENOUS | Status: DC
  Administered 2019-03-20: 60 mg/h via INTRAVENOUS
  Administered 2019-03-20 (×2): 30 mg/h via INTRAVENOUS
  Filled 2019-03-19 (×4): qty 200

## 2019-03-19 MED ORDER — LACTATED RINGERS IV BOLUS
500.0000 mL | Freq: Once | INTRAVENOUS | Status: AC
  Administered 2019-03-19: 500 mL via INTRAVENOUS

## 2019-03-19 MED ORDER — MIDAZOLAM HCL 2 MG/2ML IJ SOLN
3.0000 mg | Freq: Once | INTRAMUSCULAR | Status: AC
  Administered 2019-03-19: 3 mg via INTRAVENOUS

## 2019-03-19 NOTE — Progress Notes (Signed)
RT note: RT re taped patients et tube and placed dressing for pronning. RT and RN pronned patient, MD at bedside. RT increased peep to 20 per MD request for recruitment.

## 2019-03-19 NOTE — Progress Notes (Signed)
While turning patient from prone position to supine- PICC dressing came off with stat lock. Redressed while RT bagged (patient desat post turn). Elink MD ordered abg. Based on abg results, ordered to re prone patient. PICC dressing again peeled up. STAT order for PICC assessment order placed.

## 2019-03-19 NOTE — Progress Notes (Signed)
NAME:  Deborah Bond, MRN:  [id number], DOB:  October 20, 1960, LOS: 71 ADMISSION DATE:  04/01/19, CONSULTATION DATE:  12/10 REFERRING MD:  Bonner Puna, CHIEF COMPLAINT:  Dyspnea  Brief History   58 y/o female admitted on 12/8 with dyspnea, cough and hypoxemia due to COVID 19.  Moved to [facility] on 12/9.  Early in the AM on 12/10 she was moved to the ICU for worsening hypoxemia.    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  PF 66 > proned.  Pulse dose steroids added for possible DAH.  Worsening shock overnight despite max pressors, worsening oxygenation and ventilation despite proning. 12/22 Increased PEEP and TV to 8cc/kg for oxygenation 12/23 Proned for P:F 65 and improved pO2 and saturations 12/24 Early this morning returned supine however desaturated to SpO2 50s. Returned to proned position  Consults:  PCCM NSGY  Procedures:  12/10 Arterial line>> 12/19 Right frontal ventriculostomy>>  Significant Diagnostic Tests:  12/19 CT Head > Acute predominantly subdural hematoma in the left posterior fossa measuring up to 3.0 cm. Suspected small intraparenchymal hemorrhage within the left cerebellum as well. Effacement of the fourth ventricle and brainstem with early hydrocephalus. Patchy hypodensity in both occipital lobes, concerning for infarct. Given COVID-19 infection, constellation of findings may be related to underlying venous thrombosis. 12/20 CT Head > Interval placement of right frontal approach ventriculostomy with tip positioned within the right lateral ventricle near the foramen of Monro. Previously seen hydrocephalus has resolved, with  interval decompression of the ventricular system. No significant interval change in size and morphology of acute left posterior fossa hemorrhage with similar regional mass effect.  No other new acute intracranial abnormality. 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 CXR 12/21 > worsening bilateral airspace opacities  Micro Data:  12/8 POC SARS COV 2 > positive 12/8 blood >Negative  MRSA surveillance negative  12/21 Trach Asp - Abundant S. Aureus and rare proteus mirabilia 12/23 BCx - pending  Antimicrobials/COVID Rx:  12/8 ceftriaxone x1 12/8 azithromycin x1 12/8 remdesivir > 12/12 12/8 decadron >12/17  12/9 tocilizumab x1 12/9 CCP x1 12/21 Vanc > 12/21 Zosyn > Interim history/subjective:  Able to wean off neo gtt. Remains on levophed and vasopressin.  Early this morning returned supine however desaturated to SpO2 50s. Returned to proned position  Objective   Blood pressure (!) 92/26, pulse (!) 110, temperature 98.8 F (37.1 C), resp. rate (!) 35, height 5\' 3"  (1.6 m), weight 105.6 kg, SpO2 (!) 84 %.    Vent Mode: PRVC FiO2 (%):  [90 %-100 %] 90 % Set Rate:  [35 bmp] 35 bmp Vt Set:  [420 mL] 420 mL PEEP:  [16 cmH20-20 cmH20] 16 cmH20 Plateau Pressure:  [35 cmH20-39 cmH20] 35 cmH20   Intake/Output Summary (Last 24 hours) at 03/19/2019 0722 Last data filed at 03/19/2019 0600 Gross per 24 hour  Intake 3689.86 ml  Output 1239 ml  Net 2450.86 ml   Filed Weights   03/12/19 0421 03/13/19 0417 03/14/19 0439  Weight: 105.9 kg 104.9 kg 105.6 kg   Physical Exam: General: Critically-ill appearing, sedated HENT: R EVD in place, [place], AT, ETT in place Eyes: No scleral  icterus Respiratory: Vented breath sounds. Cardiovascular: Tachycardic, normal rhythm on telemetry GI: BS+, soft, nontender Extremities:-Edema,-tenderness Neuro: Sedated GU: Foley in place  Assessment & Plan:   Severe ARDS due to COVID  19 pneumonia with respiratory failure S/p course of remdesivir and decadron and tocilizumab  -ABG as needed -Full vent support. FIO2 100% RR 35. Reduce TV 6cc for elevated Pplat and allow PEEP to be increased to 18.  -Goal saturations 88-95% or PO2 55-80 -PRN ABG -Hold on diuresis for now -Continue Zinc, vitamin C  -PAD protocol for RASS goal -4 and -5: Fentanyl gtt. DC versed gtt  Septic shock secondary to COVID pneumonia with co-comitant bacterial pneumonia -On levophed, neo and vasopressin for MAP goal >65 -Continue broad-spectrum antibiotics -Follow-up blood cultures -On stress dose steroids  Possible DAH Bloody secretions noted in ETT with extensive bilateral airspace disease. No further evidence in the last 48 hours -S/p pulse dose steroids x 2days  S.aureus and proteus mirabilis HCAP - Continue vanc / zosyn, renally dosed - Follow-up final cultures  Acute renal failure, anuric - Consult Nephrology - Monitor UOP/Cr - Hold on diuresis  Hypoglycemia. - Continue D10.  Hypocalcemia - Repeat calcium gluconate today  Hypernatremia - Trend BMP - On D10 gtt  Hypothyroidism -Synthroid  Depression -SSRI  Acute encephalopathy 2/2 posterior fossa hemorrhage with obstructive hydrocephalus -S/p R frontal ventriculostomy for obstructive hydrocephalus. Critical illness and metabolic encephalopathy also considered in setting of electrolyte abnormalities including uremia. -Neuroprotective measures: Maintain euthermia, euglycemia, eunatremia, normoxia as able, pCO2 35-40 as able, nutrition and bowel regimen, seizure precautions, head of bed elevated -Neurology following. Appreciate input. Difficult to assess and prognosticate neuro status due to critical illness. Recommend repeat CT head when patient stable -EVD per neurosurgery. Appreciate input. Due to her critical illness, no additional intervention recommended -Serial neuro exams -AED: Scheduled Keppra  Best practice:    Diet: Hold TF, D10 Pain/Anxiety/Delirium protocol (if indicated): Fentanyl gtt / Midazolam gtt.  RASS goal -4. VAP protocol (if indicated): yes DVT prophylaxis: SCDs GI prophylaxis: famotidine Glucose control: see above Mobility: bed rest Code Status: Limited - no CPR / defib. However wishes to continue vent, vasopressor support and dialysis if needed Family Communication: Updated husband and son 12/24 Disposition: remain in ICU  The patient is critically ill with multiple organ systems failure and requires high complexity decision making for assessment and support, frequent evaluation and titration of therapies, application of advanced monitoring technologies and extensive interpretation of multiple databases.   Critical Care Time devoted to patient care services described in this note is 70 Minutes.  Mechele Collin, M.D. [facility] Pulmonary/Critical Care Medicine 03/19/2019 7:22 AM   Please see Amion for pager number to reach on-call Pulmonary and Critical Care Team.

## 2019-03-19 NOTE — Progress Notes (Signed)
   03/19/19 1923  Vitals  Pulse Rate (!) 155  ECG Heart Rate (!) 157  Cardiac Rhythm Atrial fibrillation (RVR)  New onset of dysrhythmia? Yes  Resp (!) 36  Oxygen Therapy  SpO2 (!) 81 %  Art Line  Arterial Line BP 120/58  Arterial Line MAP (mmHg) 78 mmHg  MEWS Score  MEWS RR 3  MEWS Pulse 3  MEWS Systolic 0  MEWS LOC 3  MEWS Temp 0  MEWS Score 9  MEWS Score Color Red  Provider Notification  Provider Name/Title Dr. Loanne Drilling  Date Provider Notified 03/19/19  Time Provider Notified [phone]  Notification Type Call  Notification Reason Change in status (Afib RVR)  Response See new orders (amiodorone bolus only.)  Date of Provider Response 03/19/19  Time of Provider Response [phone]

## 2019-03-19 NOTE — Progress Notes (Signed)
Pharmacy Antibiotic Note  Nonnie Pickney is a 58 y.o. female admitted on 02/27/2019 with covid. Now with PNA on the vent - growing proteus and staph aureus (suscept pending)  Scr continues to worsen at 3.87 (CrCl 18 ml/min). Tmax 101.5. Vancomycin random came back supratherpeutic at 33 - last dose on 12/23 at 0623.  Plan: Discontinue vancomycin dosing and dose by level - would consider VR on 12/26 Adjust Zosyn 3.375 gm iv q12 F/U cx for de-escalation  Height: 5\' 3"  (160 cm) Weight: 232 lb 12.9 oz (105.6 kg) IBW/kg (Calculated) : 52.4  Temp (24hrs), Avg:99.7 F (37.6 C), Min:98.3 F (36.8 C), Max:101.8 F (38.8 C)  Recent Labs  Lab 03/15/19 0611 03/16/19 0518 03/16/19 2147 03/16/19 2230 03/17/19 0500 03/17/19 0607 03/18/19 0616 03/18/19 2107 03/19/19 0430 03/19/19 0605 03/19/19 1000  WBC 18.0* 10.7*  --   --  2.6*  --  12.6*  --  16.5*  --   --   CREATININE 1.09* 0.83 1.75*  --  2.14*  --  3.19* 3.58*  --  3.87*  --   LATICACIDVEN  --   --   --  3.3*  --  6.5*  --   --   --   --   --   VANCORANDOM  --   --   --   --   --   --   --   --   --   --  33    Estimated Creatinine Clearance: 18.2 mL/min (A) (by C-G formula based on SCr of 3.87 mg/dL (H)).    No Known Allergies  Antonietta Jewel, PharmD, BCCCP Clinical Pharmacist  Phone: [phone]  Please check AMION for all [place] phone numbers After 10:00 PM, call [place] 03/19/2019 11:45 AM

## 2019-03-19 NOTE — Progress Notes (Signed)
[place] Progress Note Patient Name: Deborah Bond DOB: 1960-07-19 MRN: [id number]   Date of Service  03/19/2019  HPI/Events of Note  HR 160s (Afib), already received amiodarone 150mg  IV bolus but not yet ordered for amio infusion.  Afib rate control likely worsened by severe hypoxemia (SpO2 83-85%) in this patient who is already proned on VC 400x36, PEEP 18, FiO2 1.0 and receiving CRRT (UF @ 50c/hr).  eICU Interventions  Ordered amiodarone infusion.  Will have RT increase PEEP to 20-22 cm H2O as per ARDSNet protocol. Continue to pull fluid.      Intervention Category Major Interventions: Arrhythmia - evaluation and management  Charlott Rakes 03/19/2019, 8:33 PM

## 2019-03-19 NOTE — Procedures (Signed)
Hemodialysis Catheter Insertion Procedure Note Joliyah Lippens [id number] 05/30/1960  Procedure: Insertion of Hemodialysis Catheter Indications: Dialysis Access   Procedure Details Consent: Risks of procedure as well as the alternatives and risks of each were explained to the (patient/caregiver).  Consent for procedure obtained. Time Out: Verified patient identification, verified procedure, site/side was marked, verified correct patient position, special equipment/implants available, medications/allergies/relevent history reviewed, required imaging and test results available.  Performed  Maximum sterile technique was used including antiseptics, cap, gloves, gown, hand hygiene, mask and sheet. Skin prep: Chlorhexidine; local anesthetic administered Triple lumen hemodialysis catheter was inserted into right internal jugular vein using the Seldinger technique.  Evaluation Blood flow good Complications: No apparent complications Patient did tolerate procedure well. Chest X-ray ordered to verify placement.  CXR: pending.  U/S was used in placement  [employer] 03/19/2019

## 2019-03-19 NOTE — Progress Notes (Signed)
[place] Progress Note Patient Name: Deborah Bond DOB: Aug 09, 1960 MRN: [id number]   Date of Service  03/19/2019  HPI/Events of Note  Diffuse bilateral infiltrates with difficulty oxygenating, PT has also been oliguric, cumulative fluid balance is + 2.5 liters.  eICU Interventions  Lasix 40 mg iv x 1        Dimitrious Micciche U [name] 03/19/2019, 6:34 AM

## 2019-03-19 NOTE — Progress Notes (Signed)
Pharmacy Antibiotic Note  Deborah Bond is a 58 y.o. female admitted on 03/02/2019 with covid. Now with PNA on the vent - growing proteus and staph aureus (suscept pending)  Scr continues to worsen at 3.87 (CrCl 18 ml/min). Tmax 101.5. Vancomycin random came back supratherpeutic at 33 - last dose on 12/23 at 0623. Starting CRRT later today.   Plan: Check AM vancomycin level then will plan to start 1g IV Q24H if level is not still supratherapeutic Change zosyn to 3.375gm IV Q6H (30 min inf) F/u renal plans, C&S, clinical status and levels as needed  Height: 5\' 3"  (160 cm) Weight: 232 lb 12.9 oz (105.6 kg) IBW/kg (Calculated) : 52.4  Temp (24hrs), Avg:100 F (37.8 C), Min:98.3 F (36.8 C), Max:101.8 F (38.8 C)  Recent Labs  Lab 03/15/19 0611 03/16/19 0518 03/16/19 2230 03/17/19 0500 03/17/19 0607 03/18/19 0616 03/18/19 2107 03/19/19 0430 03/19/19 0605 03/19/19 1000 03/19/19 1142 03/19/19 1200  WBC 18.0* 10.7*  --  2.6*  --  12.6*  --  16.5*  --   --   --   --   CREATININE 1.09* 0.83  --  2.14*  --  3.19* 3.58*  --  3.87*  --   --  4.27*  LATICACIDVEN  --   --  3.3*  --  6.5*  --   --   --   --   --  1.8  --   VANCORANDOM  --   --   --   --   --   --   --   --   --  33  --   --     Estimated Creatinine Clearance: 16.5 mL/min (A) (by C-G formula based on SCr of 4.27 mg/dL (H)).    No Known Allergies   [name], PharmD, BCPS Clinical Pharmacist Please see AMION for all pharmacy numbers 03/19/2019 1:35 PM

## 2019-03-19 NOTE — Consult Note (Signed)
[facility] Renal Consultation Note  Requesting MD: Loanne Drilling Indication for Consultation: AKI with COVID  HPI:  Deborah Bond is a 58 y.o. female with past medical history significant for obesity, hyperlipidemia who was diagnosed with Covid on 12/8.  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  She continues to have significant difficulty with respiratory failure requiring maximum ventilatory support and proning.  Of late, she suffered hypotension that is been pressor requiring, possible sepsis with fever.  In association with that she is developed acute kidney injury.  She is now anuric with BUN of 143 and requiring 100% FiO2 on the vent with an acidosis.  I have discussed with CCM.  If aggressive care continues to be desired then she will need CRRT.  Family understands poor prognosis but want to give her every chance  Creatinine, Ser  Date/Time Value Ref Range Status  03/19/2019 12:00 PM 4.27 (H) 0.44 - 1.00 mg/dL Final  03/19/2019 06:05 AM 3.87 (H) 0.44 - 1.00 mg/dL Final  03/18/2019 09:07 PM 3.58 (H) 0.44 - 1.00 mg/dL Final  03/18/2019 06:16 AM 3.19 (H) 0.44 - 1.00 mg/dL Final    Comment:    DELTA CHECK NOTED FLUIDS   03/17/2019 05:00 AM 2.14 (H) 0.44 - 1.00 mg/dL Final  03/16/2019 09:47 PM 1.75 (H) 0.44 - 1.00 mg/dL Final  03/16/2019 05:18 AM 0.83 0.44 - 1.00 mg/dL Final  03/15/2019 06:11 AM 1.09 (H) 0.44 - 1.00 mg/dL Final  03/14/2019 04:38 AM 0.76 0.44 - 1.00 mg/dL Final  03/13/2019 03:00 AM 0.75 0.44 - 1.00 mg/dL Final  03/10/2019 05:45 AM 1.02 (H) 0.44 - 1.00 mg/dL Final  03/09/2019 05:53 AM 0.92 0.44 - 1.00 mg/dL Final  03/08/2019 06:33 AM 0.92 0.44 - 1.00 mg/dL Final  03/07/2019 10:00 AM 1.05 (H) 0.44 - 1.00 mg/dL Final  03/06/2019 05:00 AM 1.38 (H) 0.44 - 1.00 mg/dL Final  03/05/2019 03:05 AM 0.78 0.44 - 1.00 mg/dL Final  03/04/2019 07:31 AM 0.96 0.44 - 1.00  mg/dL Final  03/23/2019 09:17 PM 1.14 (H) 0.44 - 1.00 mg/dL Final  12/09/2018 09:17 AM 0.93 0.57 - 1.00 mg/dL Final  08/20/2018 08:24 AM 0.93 0.57 - 1.00 mg/dL Final  09/18/2017 03:08 PM 0.80 0.57 - 1.00 mg/dL Final  12/05/2016 12:53 PM 0.83 0.57 - 1.00 mg/dL Final     PMHx:   Past Medical History:  Diagnosis Date  . Depression   . Hyperlipidemia   . Thyroid disease     Past Surgical History:  Procedure Laterality Date  . ABDOMINAL HYSTERECTOMY    . BACK SURGERY    . THYROID SURGERY      Family Hx:  Family History  Problem Relation Age of Onset  . Cancer Mother        lymphoma and lung  . Cancer Father        stomach/pancreatic     Social History:  reports that she has quit smoking. She has never used smokeless tobacco. She reports that she does not drink alcohol or use drugs.  Allergies: No Known Allergies  Medications: Prior to Admission medications   Medication Sig Start Date End Date Taking? Authorizing Provider  amoxicillin-clavulanate (AUGMENTIN) 875-125 MG tablet Take 1 tablet by mouth 2 (two) times daily. Take all of this medication 02/27/19  Yes Claretta Fraise, MD  aspirin EC 81 MG tablet Take 81 mg by mouth daily.   Yes [provider]  atorvastatin (LIPITOR) 20  MG tablet TAKE ONE (1) TABLET EACH DAY Patient taking differently: Take 20 mg by mouth daily.  12/09/18  Yes Remus Loffler, PA-C  Calcium Carbonate-Vitamin D (CALCIUM 600+D) 600-200 MG-UNIT TABS Take by mouth.   Yes [provider]  diclofenac sodium (VOLTAREN) 1 % GEL Apply 2 g topically 4 (four) times daily. 12/09/18  Yes Remus Loffler, PA-C  escitalopram (LEXAPRO) 20 MG tablet Take 1 tablet (20 mg total) by mouth daily. 08/20/18  Yes Remus Loffler, PA-C  fluticasone (FLONASE) 50 MCG/ACT nasal spray Place 1 spray into both nostrils daily.  03/01/19  Yes [provider]  ibuprofen (ADVIL) 800 MG tablet TAKE ONE TABLET 3 TIMES A DAY AS NEEDED. Patient taking differently:  Take 800 mg by mouth 3 (three) times daily as needed for fever, headache or mild pain.  08/20/18  Yes Remus Loffler, PA-C  levothyroxine (SYNTHROID) 100 MCG tablet TAKE ONE (1) TABLET EACH DAY Patient taking differently: Take 100 mcg by mouth daily before breakfast.  12/09/18  Yes Remus Loffler, PA-C  Multiple Vitamins-Minerals (HAIR SKIN AND NAILS FORMULA) TABS Take 1 tablet by mouth daily.   Yes [provider]  ondansetron (ZOFRAN) 4 MG tablet Take 4 mg by mouth every 8 (eight) hours as needed for nausea or vomiting (DISSOLVE IN THE MOUTH).  03/01/19  Yes [provider]  predniSONE (DELTASONE) 10 MG tablet Take 5 daily for 2 days followed by 4,3,2 and 1 for 2 days each. 02/27/19  Yes Mechele Claude, MD  UNABLE TO FIND Med Name: Greens Complete   Yes [provider]    I have reviewed the patient's current medications.  Labs:  Results for orders placed or performed during the hospital encounter of 03/23/2019 (from the past 48 hour(s))  Glucose, capillary     Status: Abnormal   Collection Time: 03/17/19  3:34 PM  Result Value Ref Range   Glucose-Capillary 126 (H) 70 - 99 mg/dL  Glucose, capillary     Status: Abnormal   Collection Time: 03/17/19  9:27 PM  Result Value Ref Range   Glucose-Capillary 121 (H) 70 - 99 mg/dL  Glucose, capillary     Status: Abnormal   Collection Time: 03/18/19  1:34 AM  Result Value Ref Range   Glucose-Capillary 149 (H) 70 - 99 mg/dL  Glucose, capillary     Status: Abnormal   Collection Time: 03/18/19  4:27 AM  Result Value Ref Range   Glucose-Capillary 170 (H) 70 - 99 mg/dL  Protime-INR     Status: Abnormal   Collection Time: 03/18/19  6:16 AM  Result Value Ref Range   Prothrombin Time 18.4 (H) 11.4 - 15.2 seconds   INR 1.5 (H) 0.8 - 1.2    Comment: (NOTE) INR goal varies based on device and disease states. Performed at [facility], [address] [place], [place] [address]   BMET AM     Status: Abnormal   Collection  Time: 03/18/19  6:16 AM  Result Value Ref Range   Sodium 152 (H) 135 - 145 mmol/L   Potassium 4.6 3.5 - 5.1 mmol/L   Chloride 103 98 - 111 mmol/L   CO2 27 22 - 32 mmol/L   Glucose, Bld 210 (H) 70 - 99 mg/dL   BUN 914 (H) 6 - 20 mg/dL   Creatinine, Ser 7.82 (H) 0.44 - 1.00 mg/dL    Comment: DELTA CHECK NOTED FLUIDS    Calcium 5.9 (LL) 8.9 - 10.3  mg/dL    Comment: CRITICAL RESULT CALLED TO, READ BACK BY AND VERIFIED WITH: L HALL RN [phone] [id number] BY A BENNETT    GFR calc non Af Amer 15 (L) >60 mL/min   GFR calc Af Amer 18 (L) >60 mL/min   Anion gap 22 (H) 5 - 15    Comment: Performed at [facility], [address], [place], [place] [address]  CBC AM     Status: Abnormal   Collection Time: 03/18/19  6:16 AM  Result Value Ref Range   WBC 12.6 (H) 4.0 - 10.5 K/uL   RBC 3.23 (L) 3.87 - 5.11 MIL/uL   Hemoglobin 9.8 (L) 12.0 - 15.0 g/dL   HCT 08.6 (L) 57.8 - 46.9 %   MCV 98.8 80.0 - 100.0 fL   MCH 30.3 26.0 - 34.0 pg   MCHC 30.7 30.0 - 36.0 g/dL   RDW 62.9 (H) 52.8 - 41.3 %   Platelets 126 (L) 150 - 400 K/uL   nRBC 0.0 0.0 - 0.2 %    Comment: Performed at [facility], [address] [place], [place] [address]  MAG AM     Status: Abnormal   Collection Time: 03/18/19  6:16 AM  Result Value Ref Range   Magnesium 2.5 (H) 1.7 - 2.4 mg/dL    Comment: Performed at [facility], [address] [place], [place] [address]  Phosphorus AM     Status: Abnormal   Collection Time: 03/18/19  6:16 AM  Result Value Ref Range   Phosphorus 8.1 (H) 2.5 - 4.6 mg/dL    Comment: Performed at [facility], [address] [place], [place] [address]  I-STAT 7, (LYTES, BLD GAS, ICA, H+H)     Status: Abnormal   Collection Time: 03/18/19  9:36 AM  Result Value Ref Range   pH, Arterial 7.385 7.350 - 7.450   pCO2 arterial 49.4 (H) 32.0 - 48.0 mmHg   pO2, Arterial 65.0 (L) 83.0 - 108.0 mmHg   Bicarbonate 29.6 (H) 20.0 - 28.0 mmol/L   TCO2 31 22 - 32 mmol/L   O2 Saturation 92.0  %   Acid-Base Excess 4.0 (H) 0.0 - 2.0 mmol/L   Sodium 148 (H) 135 - 145 mmol/L   Potassium 4.6 3.5 - 5.1 mmol/L   Calcium, Ion 0.75 (LL) 1.15 - 1.40 mmol/L   HCT 28.0 (L) 36.0 - 46.0 %   Hemoglobin 9.5 (L) 12.0 - 15.0 g/dL   Patient temperature HIDE    Collection site ARTERIAL LINE    Drawn by RT    Sample type ARTERIAL    Comment NOTIFIED PHYSICIAN   Glucose, capillary     Status: Abnormal   Collection Time: 03/18/19  9:54 AM  Result Value Ref Range   Glucose-Capillary 245 (H) 70 - 99 mg/dL  Culture, blood (routine x 2)     Status: None (Preliminary result)   Collection Time: 03/18/19 11:00 AM   Specimen: BLOOD LEFT HAND  Result Value Ref Range   Specimen Description BLOOD LEFT HAND    Special Requests      AEROBIC BOTTLE ONLY Blood Culture results may not be optimal due to an inadequate volume of blood received in culture bottles   Culture      NO GROWTH < 12 HOURS Performed at [facility], [address], [place], [place] [address]    Report Status PENDING   Glucose, capillary     Status: Abnormal  Collection Time: 03/18/19 11:34 AM  Result Value Ref Range   Glucose-Capillary 252 (H) 70 - 99 mg/dL  I-STAT 7, (LYTES, BLD GAS, ICA, H+H)     Status: Abnormal   Collection Time: 03/18/19  4:02 PM  Result Value Ref Range   pH, Arterial 7.308 (L) 7.350 - 7.450   pCO2 arterial 56.0 (H) 32.0 - 48.0 mmHg   pO2, Arterial 76.0 (L) 83.0 - 108.0 mmHg   Bicarbonate 28.1 (H) 20.0 - 28.0 mmol/L   TCO2 30 22 - 32 mmol/L   O2 Saturation 93.0 %   Acid-Base Excess 1.0 0.0 - 2.0 mmol/L   Sodium 147 (H) 135 - 145 mmol/L   Potassium 4.6 3.5 - 5.1 mmol/L   Calcium, Ion 0.94 (L) 1.15 - 1.40 mmol/L   HCT 29.0 (L) 36.0 - 46.0 %   Hemoglobin 9.9 (L) 12.0 - 15.0 g/dL   Patient temperature HIDE    Collection site RADIAL, ALLEN'S TEST ACCEPTABLE    Drawn by RT    Sample type ARTERIAL   Glucose, capillary     Status: Abnormal   Collection Time: 03/18/19  5:30 PM  Result Value Ref  Range   Glucose-Capillary 143 (H) 70 - 99 mg/dL  Culture, blood (routine x 2)     Status: None (Preliminary result)   Collection Time: 03/18/19  6:11 PM   Specimen: BLOOD  Result Value Ref Range   Specimen Description BLOOD LEFT ANTECUBITAL    Special Requests      AEROBIC BOTTLE ONLY Blood Culture results may not be optimal due to an inadequate volume of blood received in culture bottles   Culture      NO GROWTH < 12 HOURS Performed at [facility], [address], [place], [place] [address]    Report Status PENDING   Glucose, capillary     Status: Abnormal   Collection Time: 03/18/19  7:55 PM  Result Value Ref Range   Glucose-Capillary 292 (H) 70 - 99 mg/dL  Glucose, capillary     Status: Abnormal   Collection Time: 03/18/19  7:57 PM  Result Value Ref Range   Glucose-Capillary 116 (H) 70 - 99 mg/dL  I-STAT 7, (LYTES, BLD GAS, ICA, H+H)     Status: Abnormal   Collection Time: 03/18/19  8:20 PM  Result Value Ref Range   pH, Arterial 7.313 (L) 7.350 - 7.450   pCO2 arterial 57.3 (H) 32.0 - 48.0 mmHg   pO2, Arterial 102.0 83.0 - 108.0 mmHg   Bicarbonate 28.5 (H) 20.0 - 28.0 mmol/L   TCO2 30 22 - 32 mmol/L   O2 Saturation 96.0 %   Acid-Base Excess 2.0 0.0 - 2.0 mmol/L   Sodium 147 (H) 135 - 145 mmol/L   Potassium 4.8 3.5 - 5.1 mmol/L   Calcium, Ion 0.88 (LL) 1.15 - 1.40 mmol/L   HCT 26.0 (L) 36.0 - 46.0 %   Hemoglobin 8.8 (L) 12.0 - 15.0 g/dL   Patient temperature 604.5 F    Collection site ARTERIAL LINE    Sample type ARTERIAL    Comment NOTIFIED PHYSICIAN   Basic metabolic panel     Status: Abnormal   Collection Time: 03/18/19  9:07 PM  Result Value Ref Range   Sodium 150 (H) 135 - 145 mmol/L   Potassium 5.0 3.5 - 5.1 mmol/L   Chloride 106 98 - 111 mmol/L   CO2 26 22 - 32 mmol/L   Glucose, Bld 127 (H) 70 - 99 mg/dL  BUN 134 (H) 6 - 20 mg/dL   Creatinine, Ser 1.613.58 (H) 0.44 - 1.00 mg/dL   Calcium 6.2 (LL) 8.9 - 10.3 mg/dL    Comment: CRITICAL RESULT CALLED TO,  READ BACK BY AND VERIFIED WITH: K.JONES RN [id number] 03/18/2019 MCCORMICK K    GFR calc non Af Amer 13 (L) >60 mL/min   GFR calc Af Amer 15 (L) >60 mL/min   Anion gap 18 (H) 5 - 15    Comment: Performed at [facility], [address] [place], [place] [address]  Glucose, capillary     Status: Abnormal   Collection Time: 03/18/19 11:55 PM  Result Value Ref Range   Glucose-Capillary 105 (H) 70 - 99 mg/dL  Glucose, capillary     Status: Abnormal   Collection Time: 03/19/19  4:03 AM  Result Value Ref Range   Glucose-Capillary 132 (H) 70 - 99 mg/dL  Protime-INR     Status: Abnormal   Collection Time: 03/19/19  4:30 AM  Result Value Ref Range   Prothrombin Time 15.9 (H) 11.4 - 15.2 seconds   INR 1.3 (H) 0.8 - 1.2    Comment: (NOTE) INR goal varies based on device and disease states. Performed at [facility], [address] [place], [place] [address]   Fibrinogen     Status: Abnormal   Collection Time: 03/19/19  4:30 AM  Result Value Ref Range   Fibrinogen 650 (H) 210 - 475 mg/dL    Comment: Performed at [facility], [address] [place], [place] [address]  CBC     Status: Abnormal   Collection Time: 03/19/19  4:30 AM  Result Value Ref Range   WBC 16.5 (H) 4.0 - 10.5 K/uL   RBC 2.39 (L) 3.87 - 5.11 MIL/uL   Hemoglobin 7.3 (L) 12.0 - 15.0 g/dL    Comment: REPEATED TO VERIFY   HCT 23.6 (L) 36.0 - 46.0 %   MCV 98.7 80.0 - 100.0 fL   MCH 30.5 26.0 - 34.0 pg   MCHC 30.9 30.0 - 36.0 g/dL   RDW 78.216.2 (H) 95.611.5 - 21.315.5 %   Platelets 67 (L) 150 - 400 K/uL    Comment: REPEATED TO VERIFY PLATELET COUNT CONFIRMED BY SMEAR Immature Platelet Fraction may be clinically indicated, consider ordering this additional test [id number]    nRBC 0.0 0.0 - 0.2 %    Comment: Performed at [facility], [address] [place], [place] [address]  I-STAT 7, (LYTES, BLD GAS, ICA, H+H)     Status: Abnormal   Collection Time: 03/19/19  5:08 AM  Result Value Ref Range   pH,  Arterial 7.371 7.350 - 7.450   pCO2 arterial 50.1 (H) 32.0 - 48.0 mmHg   pO2, Arterial 46.0 (L) 83.0 - 108.0 mmHg   Bicarbonate 29.0 (H) 20.0 - 28.0 mmol/L   TCO2 30 22 - 32 mmol/L   O2 Saturation 79.0 %   Acid-Base Excess 3.0 (H) 0.0 - 2.0 mmol/L   Sodium 145 135 - 145 mmol/L   Potassium 5.4 (H) 3.5 - 5.1 mmol/L   Calcium, Ion 0.84 (LL) 1.15 - 1.40 mmol/L   HCT 25.0 (L) 36.0 - 46.0 %   Hemoglobin 8.5 (L) 12.0 - 15.0 g/dL   Patient temperature 28.498.8 F    Collection site ARTERIAL LINE    Drawn by Operator    Sample type ARTERIAL    Comment NOTIFIED PHYSICIAN   I-STAT 7, (LYTES, BLD GAS, ICA,  H+H)     Status: Abnormal   Collection Time: 03/19/19  5:42 AM  Result Value Ref Range   pH, Arterial 7.347 (L) 7.350 - 7.450   pCO2 arterial 51.7 (H) 32.0 - 48.0 mmHg   pO2, Arterial 56.0 (L) 83.0 - 108.0 mmHg   Bicarbonate 28.3 (H) 20.0 - 28.0 mmol/L   TCO2 30 22 - 32 mmol/L   O2 Saturation 86.0 %   Acid-Base Excess 2.0 0.0 - 2.0 mmol/L   Sodium 145 135 - 145 mmol/L   Potassium 5.1 3.5 - 5.1 mmol/L   Calcium, Ion 0.85 (LL) 1.15 - 1.40 mmol/L   HCT 25.0 (L) 36.0 - 46.0 %   Hemoglobin 8.5 (L) 12.0 - 15.0 g/dL   Patient temperature 16.1 F    Collection site ARTERIAL LINE    Sample type ARTERIAL    Comment NOTIFIED PHYSICIAN   Basic metabolic panel     Status: Abnormal   Collection Time: 03/19/19  6:05 AM  Result Value Ref Range   Sodium 149 (H) 135 - 145 mmol/L   Potassium 5.2 (H) 3.5 - 5.1 mmol/L   Chloride 103 98 - 111 mmol/L   CO2 25 22 - 32 mmol/L   Glucose, Bld 145 (H) 70 - 99 mg/dL   BUN 096 (H) 6 - 20 mg/dL   Creatinine, Ser 0.45 (H) 0.44 - 1.00 mg/dL   Calcium 6.3 (LL) 8.9 - 10.3 mg/dL    Comment: CRITICAL RESULT CALLED TO, READ BACK BY AND VERIFIED WITH: SUSANNA WASHBURN,RN AT 4098 03/19/2019 BY ZBEECH.    GFR calc non Af Amer 12 (L) >60 mL/min   GFR calc Af Amer 14 (L) >60 mL/min   Anion gap 21 (H) 5 - 15    Comment: Performed at [facility], [address], [place], [place] [address]  Glucose, capillary     Status: Abnormal   Collection Time: 03/19/19  7:41 AM  Result Value Ref Range   Glucose-Capillary 135 (H) 70 - 99 mg/dL  I-STAT 7, (LYTES, BLD GAS, ICA, H+H)     Status: Abnormal   Collection Time: 03/19/19  9:31 AM  Result Value Ref Range   pH, Arterial 7.228 (L) 7.350 - 7.450   pCO2 arterial 71.4 (HH) 32.0 - 48.0 mmHg   pO2, Arterial 66.0 (L) 83.0 - 108.0 mmHg   Bicarbonate 29.8 (H) 20.0 - 28.0 mmol/L   TCO2 32 22 - 32 mmol/L   O2 Saturation 87.0 %   Acid-Base Excess 1.0 0.0 - 2.0 mmol/L   Sodium 145 135 - 145 mmol/L   Potassium 5.5 (H) 3.5 - 5.1 mmol/L   Calcium, Ion 0.83 (LL) 1.15 - 1.40 mmol/L   HCT 24.0 (L) 36.0 - 46.0 %   Hemoglobin 8.2 (L) 12.0 - 15.0 g/dL   Patient temperature HIDE    Collection site ARTERIAL LINE    Drawn by RT    Sample type ARTERIAL    Comment NOTIFIED PHYSICIAN   Vancomycin, random     Status: None   Collection Time: 03/19/19 10:00 AM  Result Value Ref Range   Vancomycin Rm 33     Comment:        Random Vancomycin therapeutic range is dependent on dosage and time of specimen collection. A peak range is 20.0-40.0 ug/mL A trough range is 5.0-15.0 ug/mL        Performed at [facility], [address], [place], [place] [address]   POCT I-Stat EG7  Status: Abnormal   Collection Time: 03/19/19 11:56 AM  Result Value Ref Range   pH, Ven 7.149 (LL) 7.250 - 7.430   pCO2, Ven 85.7 (HH) 44.0 - 60.0 mmHg   pO2, Ven 75.0 (H) 32.0 - 45.0 mmHg   Bicarbonate 29.3 (H) 20.0 - 28.0 mmol/L   TCO2 32 22 - 32 mmol/L   O2 Saturation 87.0 %   Sodium 145 135 - 145 mmol/L   Potassium 5.6 (H) 3.5 - 5.1 mmol/L   Calcium, Ion 0.87 (LL) 1.15 - 1.40 mmol/L   HCT 27.0 (L) 36.0 - 46.0 %   Hemoglobin 9.2 (L) 12.0 - 15.0 g/dL   Patient temperature 938.1 F    Sample type VENOUS   Basic metabolic panel     Status: Abnormal   Collection Time: 03/19/19 12:00 PM  Result Value Ref Range   Sodium 149 (H) 135 -  145 mmol/L   Potassium 5.9 (H) 3.5 - 5.1 mmol/L   Chloride 104 98 - 111 mmol/L   CO2 26 22 - 32 mmol/L   Glucose, Bld 168 (H) 70 - 99 mg/dL   BUN 829 (H) 6 - 20 mg/dL   Creatinine, Ser 9.37 (H) 0.44 - 1.00 mg/dL   Calcium 6.5 (L) 8.9 - 10.3 mg/dL   GFR calc non Af Amer 11 (L) >60 mL/min   GFR calc Af Amer 12 (L) >60 mL/min   Anion gap 19 (H) 5 - 15    Comment: Performed at [facility], [address] [place], [place] [address]     ROS:  Review of systems not obtained due to patient factors.  Physical Exam: Vitals:   03/19/19 1100 03/19/19 1200  BP: 123/63 114/61  Pulse: (!) 112 (!) 115  Resp: (!) 35 (!) 39  Temp:  (!) 100.7 F (38.2 C)  SpO2: (!) 88% (!) 89%     General: Sedated, currently prone on the ventilator.  I can see her through door.  Not fully examined secondary to isolation status.  CCM exam reviewed and discussed with them   Assessment/Plan: 58 year old white female minimal medical conditions but with severe case of Covid with multiple complications.  Now with acute kidney injury 1.Renal-acute kidney injury related to hemodynamic instability related to severe Covid infection.  Anuric with uremia, acidosis (albeit resp)  and hyperkalemia.  If aggressive care is continued to be desired she will need CRRT.  I have discussed with CCM.  There may be a problem logistically placing Vas-Cath-will have orders available to start CRRT later today if possible.  No heparin secondary to ICH- 2 K bath 2. Hypertension/volume  -hypotensive on pressors.  However is not making urine and is on 100% FiO2 on vent.  Will attempt to pull some fluid right off 50-100 per hour  3.  Elytes-we will start with 2 potassium bath.  Suspect phosphorus will come down with CRRT 4. Anemia  -supportive care.  Transfuse when needed   Cecille Aver 03/19/2019, 12:43 PM

## 2019-03-19 NOTE — Progress Notes (Signed)
eLink Physician-Brief Progress Note Patient Name: Deborah Bond DOB: [birth year] MRN: [id number]   Date of Service  03/19/2019  HPI/Events of Note  Pt taken out of prone position and desaturated into the 70's  eICU Interventions  Pt bagged through a PEEP valve set at 20 cm of PEEP, bilateral breath sounds verified and an ABG was ordered to confirm poor oxygenation as the pulse oximeter waveform was erratic, once hypoxemia was confirmed Pt was flipped back into the prone position, persistent hypoxemia was treated by giving her a 3 mg Versed iv bolus followed by intermittent paralysis with 10 mg of Nimbex.        Kerry Kass Lakeem Rozo 03/19/2019, 5:22 AM

## 2019-03-19 NOTE — Progress Notes (Signed)
Patient's parameters slightly improved overnight.  Remains on pressors.  Remains deeply sedated.  Ventriculostomy continues to function.  Patient critically ill secondary to Covid associated respiratory failure/ARDS.  Renal failure continues to progress.  Potassium also rising.  Patient with a moderate right inferior posterior fossa subdural hematoma with obstructive hydrocephalus.  At this point our only real options for treatment are continued ventricular drainage.  Patient thought to be unlikely to survive.

## 2019-03-20 ENCOUNTER — Inpatient Hospital Stay (HOSPITAL_COMMUNITY): Payer: BC Managed Care – PPO

## 2019-03-20 LAB — POCT I-STAT 7, (LYTES, BLD GAS, ICA,H+H)
Acid-base deficit: 3 mmol/L — ABNORMAL HIGH (ref 0.0–2.0)
Acid-base deficit: 3 mmol/L — ABNORMAL HIGH (ref 0.0–2.0)
Acid-base deficit: 5 mmol/L — ABNORMAL HIGH (ref 0.0–2.0)
Acid-base deficit: 7 mmol/L — ABNORMAL HIGH (ref 0.0–2.0)
Acid-base deficit: 8 mmol/L — ABNORMAL HIGH (ref 0.0–2.0)
Acid-base deficit: 9 mmol/L — ABNORMAL HIGH (ref 0.0–2.0)
Bicarbonate: 20.7 mmol/L (ref 20.0–28.0)
Bicarbonate: 21.8 mmol/L (ref 20.0–28.0)
Bicarbonate: 24 mmol/L (ref 20.0–28.0)
Bicarbonate: 24.8 mmol/L (ref 20.0–28.0)
Bicarbonate: 26 mmol/L (ref 20.0–28.0)
Bicarbonate: 26.7 mmol/L (ref 20.0–28.0)
Calcium, Ion: 0.87 mmol/L — CL (ref 1.15–1.40)
Calcium, Ion: 0.89 mmol/L — CL (ref 1.15–1.40)
Calcium, Ion: 0.89 mmol/L — CL (ref 1.15–1.40)
Calcium, Ion: 0.9 mmol/L — ABNORMAL LOW (ref 1.15–1.40)
Calcium, Ion: 0.92 mmol/L — ABNORMAL LOW (ref 1.15–1.40)
Calcium, Ion: 0.93 mmol/L — ABNORMAL LOW (ref 1.15–1.40)
HCT: 24 % — ABNORMAL LOW (ref 36.0–46.0)
HCT: 25 % — ABNORMAL LOW (ref 36.0–46.0)
HCT: 26 % — ABNORMAL LOW (ref 36.0–46.0)
HCT: 26 % — ABNORMAL LOW (ref 36.0–46.0)
HCT: 27 % — ABNORMAL LOW (ref 36.0–46.0)
HCT: 29 % — ABNORMAL LOW (ref 36.0–46.0)
Hemoglobin: 8.2 g/dL — ABNORMAL LOW (ref 12.0–15.0)
Hemoglobin: 8.5 g/dL — ABNORMAL LOW (ref 12.0–15.0)
Hemoglobin: 8.8 g/dL — ABNORMAL LOW (ref 12.0–15.0)
Hemoglobin: 8.8 g/dL — ABNORMAL LOW (ref 12.0–15.0)
Hemoglobin: 9.2 g/dL — ABNORMAL LOW (ref 12.0–15.0)
Hemoglobin: 9.9 g/dL — ABNORMAL LOW (ref 12.0–15.0)
O2 Saturation: 81 %
O2 Saturation: 84 %
O2 Saturation: 86 %
O2 Saturation: 89 %
O2 Saturation: 91 %
O2 Saturation: 94 %
Patient temperature: 34.3
Patient temperature: 95
Patient temperature: 96.4
Patient temperature: 96.6
Patient temperature: 98.4
Patient temperature: 98.6
Potassium: 4.5 mmol/L (ref 3.5–5.1)
Potassium: 4.7 mmol/L (ref 3.5–5.1)
Potassium: 4.7 mmol/L (ref 3.5–5.1)
Potassium: 5 mmol/L (ref 3.5–5.1)
Potassium: 5.1 mmol/L (ref 3.5–5.1)
Potassium: 5.3 mmol/L — ABNORMAL HIGH (ref 3.5–5.1)
Sodium: 135 mmol/L (ref 135–145)
Sodium: 136 mmol/L (ref 135–145)
Sodium: 137 mmol/L (ref 135–145)
Sodium: 137 mmol/L (ref 135–145)
Sodium: 140 mmol/L (ref 135–145)
Sodium: 140 mmol/L (ref 135–145)
TCO2: 23 mmol/L (ref 22–32)
TCO2: 24 mmol/L (ref 22–32)
TCO2: 27 mmol/L (ref 22–32)
TCO2: 27 mmol/L (ref 22–32)
TCO2: 28 mmol/L (ref 22–32)
TCO2: 29 mmol/L (ref 22–32)
pCO2 arterial: 64.9 mmHg — ABNORMAL HIGH (ref 32.0–48.0)
pCO2 arterial: 66.8 mmHg (ref 32.0–48.0)
pCO2 arterial: 68.3 mmHg (ref 32.0–48.0)
pCO2 arterial: 69.1 mmHg (ref 32.0–48.0)
pCO2 arterial: 69.6 mmHg (ref 32.0–48.0)
pCO2 arterial: 83.2 mmHg (ref 32.0–48.0)
pH, Arterial: 7.062 — CL (ref 7.350–7.450)
pH, Arterial: 7.089 — CL (ref 7.350–7.450)
pH, Arterial: 7.128 — CL (ref 7.350–7.450)
pH, Arterial: 7.165 — CL (ref 7.350–7.450)
pH, Arterial: 7.179 — CL (ref 7.350–7.450)
pH, Arterial: 7.181 — CL (ref 7.350–7.450)
pO2, Arterial: 56 mmHg — ABNORMAL LOW (ref 83.0–108.0)
pO2, Arterial: 62 mmHg — ABNORMAL LOW (ref 83.0–108.0)
pO2, Arterial: 63 mmHg — ABNORMAL LOW (ref 83.0–108.0)
pO2, Arterial: 77 mmHg — ABNORMAL LOW (ref 83.0–108.0)
pO2, Arterial: 77 mmHg — ABNORMAL LOW (ref 83.0–108.0)
pO2, Arterial: 84 mmHg (ref 83.0–108.0)

## 2019-03-20 LAB — GLUCOSE, CAPILLARY
Glucose-Capillary: 106 mg/dL — ABNORMAL HIGH (ref 70–99)
Glucose-Capillary: 107 mg/dL — ABNORMAL HIGH (ref 70–99)
Glucose-Capillary: 106 mg/dL — ABNORMAL HIGH (ref 70–99)
Glucose-Capillary: 92 mg/dL (ref 70–99)
Glucose-Capillary: 122 mg/dL — ABNORMAL HIGH (ref 70–99)
Glucose-Capillary: 99 mg/dL (ref 70–99)

## 2019-03-20 LAB — RENAL FUNCTION PANEL
Albumin: 1.4 g/dL — ABNORMAL LOW (ref 3.5–5.0)
Albumin: 1.3 g/dL — ABNORMAL LOW (ref 3.5–5.0)
Anion gap: 16 — ABNORMAL HIGH (ref 5–15)
Anion gap: 18 — ABNORMAL HIGH (ref 5–15)
BUN: 87 mg/dL — ABNORMAL HIGH (ref 6–20)
BUN: 61 mg/dL — ABNORMAL HIGH (ref 6–20)
CO2: 23 mmol/L (ref 22–32)
CO2: 19 mmol/L — ABNORMAL LOW (ref 22–32)
Calcium: 6.6 mg/dL — ABNORMAL LOW (ref 8.9–10.3)
Calcium: 6.5 mg/dL — ABNORMAL LOW (ref 8.9–10.3)
Chloride: 102 mmol/L (ref 98–111)
Chloride: 102 mmol/L (ref 98–111)
Creatinine, Ser: 2.51 mg/dL — ABNORMAL HIGH (ref 0.44–1.00)
Creatinine, Ser: 1.93 mg/dL — ABNORMAL HIGH (ref 0.44–1.00)
GFR calc Af Amer: 23 mL/min — ABNORMAL LOW (ref >60)
GFR calc Af Amer: 32 mL/min — ABNORMAL LOW (ref >60)
GFR calc non Af Amer: 20 mL/min — ABNORMAL LOW (ref >60)
GFR calc non Af Amer: 28 mL/min — ABNORMAL LOW (ref >60)
Glucose, Bld: 127 mg/dL — ABNORMAL HIGH (ref 70–99)
Glucose, Bld: 125 mg/dL — ABNORMAL HIGH (ref 70–99)
Phosphorus: 7.7 mg/dL — ABNORMAL HIGH (ref 2.5–4.6)
Phosphorus: 8.1 mg/dL — ABNORMAL HIGH (ref 2.5–4.6)
Potassium: 4.6 mmol/L (ref 3.5–5.1)
Potassium: 5.2 mmol/L — ABNORMAL HIGH (ref 3.5–5.1)
Sodium: 141 mmol/L (ref 135–145)
Sodium: 139 mmol/L (ref 135–145)

## 2019-03-20 LAB — CBC
HCT: 30.6 % — ABNORMAL LOW (ref 36.0–46.0)
Hemoglobin: 9.2 g/dL — ABNORMAL LOW (ref 12.0–15.0)
MCH: 30.2 pg (ref 26.0–34.0)
MCHC: 30.1 g/dL (ref 30.0–36.0)
MCV: 100.3 fL — ABNORMAL HIGH (ref 80.0–100.0)
Platelets: 56 10*3/uL — ABNORMAL LOW (ref 150–400)
RBC: 3.05 MIL/uL — ABNORMAL LOW (ref 3.87–5.11)
RDW: 16.3 % — ABNORMAL HIGH (ref 11.5–15.5)
WBC: 18.1 10*3/uL — ABNORMAL HIGH (ref 4.0–10.5)

## 2019-03-20 LAB — MAGNESIUM
Magnesium: 2.6 mg/dL — ABNORMAL HIGH (ref 1.7–2.4)
Magnesium: 2.5 mg/dL — ABNORMAL HIGH (ref 1.7–2.4)

## 2019-03-20 LAB — VANCOMYCIN, RANDOM: Vancomycin Rm: 16

## 2019-03-20 LAB — PROTIME-INR
INR: 1.2 (ref 0.8–1.2)
Prothrombin Time: 14.6 seconds (ref 11.4–15.2)

## 2019-03-20 MED ORDER — VANCOMYCIN HCL IN DEXTROSE 1-5 GM/200ML-% IV SOLN
1000.0000 mg | INTRAVENOUS | Status: DC
  Administered 2019-03-20 – 2019-03-21 (×2): 1000 mg via INTRAVENOUS
  Filled 2019-03-20 (×2): qty 200

## 2019-03-20 MED ORDER — SODIUM BICARBONATE 8.4 % IV SOLN
100.0000 meq | Freq: Once | INTRAVENOUS | Status: AC
  Administered 2019-03-20: 100 meq via INTRAVENOUS

## 2019-03-20 MED ORDER — NEPRO/CARBSTEADY PO LIQD
1000.0000 mL | ORAL | Status: DC
  Administered 2019-03-20: 1000 mL via ORAL
  Filled 2019-03-20: qty 1000

## 2019-03-20 MED ORDER — EPINEPHRINE HCL 5 MG/250ML IV SOLN IN NS
0.5000 ug/min | INTRAVENOUS | Status: DC
  Administered 2019-03-20: 11 ug/min via INTRAVENOUS
  Administered 2019-03-20: 16:00:00 0.5 ug/min via INTRAVENOUS
  Administered 2019-03-21: 11 ug/min via INTRAVENOUS
  Filled 2019-03-20 (×4): qty 250

## 2019-03-20 MED ORDER — FENTANYL CITRATE (PF) 2500 MCG/50ML IJ SOLN
50.0000 ug/h | Status: DC
  Administered 2019-03-20: 50 ug/h via INTRAVENOUS
  Administered 2019-03-20 (×4): 200 ug/h via INTRAVENOUS
  Filled 2019-03-20 (×7): qty 20

## 2019-03-20 MED ORDER — FENTANYL 2500MCG IN NS 250ML (10MCG/ML) PREMIX INFUSION: 50.0000 ug/h | INTRAVENOUS | Status: DC

## 2019-03-20 MED ORDER — CALCIUM GLUCONATE-NACL 2-0.675 GM/100ML-% IV SOLN
2.0000 g | Freq: Once | INTRAVENOUS | Status: AC
  Administered 2019-03-20: 2000 mg via INTRAVENOUS
  Filled 2019-03-20: qty 100

## 2019-03-20 MED ORDER — FENTANYL CITRATE (PF) 2500 MCG/50ML IJ SOLN: 50.0000 ug/h | INTRAMUSCULAR | Status: DC

## 2019-03-20 MED ORDER — LEVETIRACETAM 100 MG/ML PO SOLN
500.0000 mg | Freq: Two times a day (BID) | ORAL | Status: DC
  Administered 2019-03-20 (×2): 500 mg
  Filled 2019-03-20 (×2): qty 5

## 2019-03-20 MED ORDER — SODIUM BICARBONATE 8.4 % IV SOLN
INTRAVENOUS | Status: AC
  Filled 2019-03-20: qty 100

## 2019-03-20 MED ORDER — SODIUM BICARBONATE 8.4 % IV SOLN: 50.0000 meq | Freq: Once | INTRAVENOUS | Status: DC

## 2019-03-20 NOTE — Progress Notes (Addendum)
NAME:  Deborah Bond, MRN:  [id number], DOB:  January 17, 1961, LOS: 68 ADMISSION DATE:  03-28-2019, CONSULTATION DATE:  12/10 REFERRING MD:  Bonner Puna, CHIEF COMPLAINT:  Dyspnea  Brief History   58 y/o female admitted on 12/8 with dyspnea, cough and hypoxemia due to COVID 19.  Moved to [facility] on 12/9.  Early in the AM on 12/10 she was moved to the ICU for worsening hypoxemia.    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  PF 66 > proned.  Pulse dose steroids added for possible DAH.  Worsening shock overnight despite max pressors, worsening oxygenation and ventilation despite proning. 12/22 Increased PEEP and TV to 8cc/kg for oxygenation 12/23 Proned for P:F 65 and improved pO2 and saturations 12/24 Early this morning returned supine however desaturated to SpO2 50s. Returned to proned position. However despite re-proning had refractory hypoxemia. Patient paralyzed and started on dialysis  Consults:  PCCM NSGY  Procedures:  12/10 Arterial line>> 12/19 Right frontal ventriculostomy>>  Significant Diagnostic Tests:  12/19 CT Head > Acute predominantly subdural hematoma in the left posterior fossa measuring up to 3.0 cm. Suspected small intraparenchymal hemorrhage within the left cerebellum as well. Effacement of the fourth ventricle and brainstem with early hydrocephalus. Patchy hypodensity in both occipital lobes, concerning for infarct. Given COVID-19 infection, constellation of findings may be related to underlying venous thrombosis. 12/20 CT Head > Interval placement of right frontal approach ventriculostomy with tip positioned within the right  lateral ventricle near the foramen of [place]ro. Previously seen hydrocephalus has resolved, with interval decompression of the ventricular system. No significant interval change in size and morphology of acute left posterior fossa hemorrhage with similar regional mass effect.  No other new acute intracranial abnormality. 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 CXR 12/21 > worsening bilateral airspace opacities  Micro Data:  12/8 POC SARS COV 2 > positive 12/8 blood >Negative  MRSA surveillance negative  12/21 Trach Asp - Abundant S. Aureus and rare proteus mirabilia 12/23 BCx - pending  Antimicrobials/COVID Rx:  12/8 ceftriaxone x1 12/8 azithromycin x1 12/8 remdesivir > 12/12 12/8 decadron >12/17  12/9 tocilizumab x1 12/9 CCP x1 12/21 Vanc > 12/21 Zosyn > Interim history/subjective:  Yesterday, patient was returned to supine for HD catheter placement. Line was successfully placed however she became severely hypoxemic after 40 minutes requiring re-proning. Paralytics was started for refractory hypoxemia.  Overnight, had AFRVR. Started on Amio  This morning SpO2 94%. On CRRT  Objective   Blood pressure (!) 92/30, pulse (!) 117, temperature (!) 95 F (35 C), resp. rate (!) 35, height 5\' 3"  (1.6 m), weight 125.6 kg, SpO2 90 %.    Vent Mode: PRVC FiO2 (%):  [100 %] 100 % Set Rate:  [35 bmp] 35 bmp Vt Set:  [320 mL-400 mL] 400 mL PEEP:  [18 cmH20-20 cmH20] 20 cmH20 Plateau Pressure:  [35 cmH20-47 cmH20] 39 cmH20   Intake/Output Summary (Last 24 hours) at 03/20/2019 0856 Last data filed at 03/20/2019 0800 Gross per 24 hour  Intake 4470.07 ml  Output 2641 ml  Net 1829.07 ml   Filed Weights   03/13/19 0417 03/14/19 0439 03/20/19  0500  Weight: 104.9 kg 105.6 kg 125.6 kg   Physical Exam: General: Critically ill-appearing, sedated and paralyzed HENT: [place], AT, ETT in place, facial edema Eyes: periorbital  edema, no scleral icterus Respiratory: No breaths over vent due to sedation and paralytics Cardiovascular: Tachycardic, paroxysmal atrial fibrillation on telemetry, -M/R/G, no JVD GI: BS+, soft, nontender Extremities:-Edema,-tenderness Neuro: Sedated Skin: Abdominal skin tear, dressing place GU: Foley in place  Assessment & Plan:   Severe ARDS due to COVID 19 pneumonia with respiratory failure S/p course of remdesivir and decadron and tocilizumab  -Reviewed ABG. Improved oxygenation with acceptable acidosis -Full vent support. FIO2 100% RR 35. TV 7cc. Will reduce PEEP to 18 and recheck ABG  -Goal saturations 88-95% or PO2 55-80 -PRN ABG -Volume removal per CRRT -Paralytic protocol started yesterday. Consider discontinuing after 24 hours -Continue Zinc, vitamin C  -PAD protocol for RASS goal -4 and -5: Fentanyl gtt and Versed  Septic shock secondary to COVID pneumonia with co-comitant bacterial pneumonia -On levophed and vasopressin for MAP goal >65 -Continue broad-spectrum antibiotics -Follow-up blood cultures -On stress dose steroids  Possible DAH Bloody secretions noted in ETT with extensive bilateral airspace disease. No further evidence in the last 48 hours -S/p pulse dose steroids x 2days  S.aureus and proteus mirabilis HCAP - Continue vanc / zosyn, renally dosed - Follow-up final cultures  Acute renal failure, anuric - Nephrology consulted. Appreciate input - Volume removal via CRRT  AFRVR - Continue amiodarone gtt  Hypoglycemia. - Continue D10.  Hypocalcemia - Replete calcium gluconate   Hypernatremia - resolved - Trend BMP  Hypothyroidism -Synthroid  Depression -SSRI  Acute encephalopathy 2/2 posterior fossa hemorrhage with obstructive hydrocephalus -S/p R frontal ventriculostomy for obstructive hydrocephalus. Critical illness and metabolic encephalopathy also considered in setting of electrolyte abnormalities including uremia. -Neuroprotective  measures: Maintain euthermia, euglycemia, eunatremia, normoxia as able, pCO2 35-40 as able, nutrition and bowel regimen, seizure precautions, head of bed elevated -Neurology following. Appreciate input. Difficult to assess and prognosticate neuro status due to critical illness. Recommend repeat CT head when patient stable -EVD per neurosurgery. Appreciate input. Due to her critical illness, no additional intervention recommended -Serial neuro exams -AED: Scheduled Keppra  Best practice:  Diet: Hold TF, D10 Pain/Anxiety/Delirium protocol (if indicated): Fentanyl gtt / Midazolam gtt.  RASS goal -4. VAP protocol (if indicated): yes DVT prophylaxis: SCDs GI prophylaxis: famotidine Glucose control: see above Mobility: bed rest Code Status: Limited - no CPR / defib. However wishes to continue vent, vasopressor support and dialysis if needed Family Communication: Updated husband and son 12/25 Disposition: remain in ICU  The patient is critically ill with multiple organ systems failure and requires high complexity decision making for assessment and support, frequent evaluation and titration of therapies, application of advanced monitoring technologies and extensive interpretation of multiple databases.   Critical Care Time devoted to patient care services described in this note is 70 Minutes.  Mechele Collin, M.D. [facility] Pulmonary/Critical Care Medicine 03/20/2019 9:20 AM   Please see Amion for pager number to reach on-call Pulmonary and Critical Care Team.

## 2019-03-20 NOTE — Progress Notes (Signed)
Pharmacy Antibiotic Note  Deborah Bond is a 58 y.o. female admitted on 2019/03/25 with covid. Now with PNA on the vent - growing proteus and staph aureus (suscept pending)  CRRT initiated yesterday evening- no prolonged off times charted.  Vancomycin repeat level this AM is down to 16.  Scr coming down at 2.51 with CRRT initiation. Temp is down (low on CRRT).   Plan: Start Vancomycin 1g IV every 24hrs as planned while on CRRT Continue zosyn to 3.375gm IV Q6H (30 min inf) F/u renal plans, C&S, clinical status and levels as needed  Height: 5\' 3"  (160 cm) Weight: 276 lb 14.4 oz (125.6 kg) IBW/kg (Calculated) : 52.4  Temp (24hrs), Avg:93.7 F (34.3 C), Min:91.8 F (33.2 C), Max:100.7 F (38.2 C)  Recent Labs  Lab 03/16/19 0518 03/16/19 2230 03/17/19 0500 03/17/19 0607 03/18/19 0616 03/18/19 2107 03/19/19 0430 03/19/19 0605 03/19/19 1000 03/19/19 1142 03/19/19 1200 03/19/19 1505 03/19/19 1727 03/20/19 0419  WBC 10.7*  --  2.6*  --  12.6*  --  16.5*  --   --   --   --   --   --  18.1*  CREATININE 0.83  --  2.14*  --  3.19* 3.58*  --  3.87*  --   --  4.27*  --  4.35* 2.51*  LATICACIDVEN  --  3.3*  --  6.5*  --   --   --   --   --  1.8  --  2.1*  --   --   VANCORANDOM  --   --   --   --   --   --   --   --  33  --   --   --   --  16    Estimated Creatinine Clearance: 31.1 mL/min (A) (by C-G formula based on SCr of 2.51 mg/dL (H)).    No Known Allergies   Sloan Leiter, PharmD, BCPS, BCCCP Clinical Pharmacist Please refer to [facility] for [place] numbers 03/20/2019 8:38 AM

## 2019-03-20 NOTE — Progress Notes (Signed)
Subjective:  Started on CRRT-  Machine being restarted at present-  Also developed Afib overnight-  Still hemodynamically unstable on pressors - numbers much improved on CRRT - still ended up being positive fluid balance   Objective Vital signs in last 24 hours: Vitals:   03/20/19 0715 03/20/19 0730 03/20/19 0800 03/20/19 0810  BP: 97/78  (!) 92/30 (!) 92/30  Pulse: (!) 110 (!) 106  (!) 117  Resp: (!) 35 (!) 35 (!) 0 (!) 35  Temp: (!) 94.5 F (34.7 C)  (!) 95 F (35 C)   TempSrc:      SpO2: 91% 90%    Weight:      Height:       Weight change:   Intake/Output Summary (Last 24 hours) at 03/20/2019 0847 Last data filed at 03/20/2019 0700 Gross per 24 hour  Intake 4324.11 ml  Output 2641 ml  Net 1683.11 ml      Assessment/Plan: 58 year old white female minimal medical conditions but with severe case of Covid with multiple complications.  Now with acute kidney injury 1.Renal-acute kidney injury related to hemodynamic instability related to severe Covid infection.  Anuric with uremia, acidosis (albeit resp)  and hyperkalemia.  Wanted to continue aggressive care so started CRRT on 12/24  No heparin secondary to [place]- 2 K bath- running well overnight-  No change to fluids for now.  If clotting of machine becomes an issue may need to do citrate as cannot do heparin 2. Hypertension/volume  -hypotensive on pressors.  Not making urine and is on 100% FiO2 on vent.  Will attempt to pull some fluid  50-100 per hour  3.  Elytes-we will start with 2 potassium bath.  Suspect phosphorus coming down with CRRT 4. Anemia  -supportive care.  Transfuse when needed- stable for now      [place]: Basic Metabolic Panel: Recent Labs  Lab 03/18/19 0616 03/19/19 1200 03/19/19 1504 03/19/19 1727 03/20/19 0020 03/20/19 0419 03/20/19 0555  NA   < > 149*  --  147* 140 141 140  K   < > 5.9*  --  5.9* 4.7 4.6 4.5  CL  --  104  --  105  --  102  --   CO2  --  26  --  23  --  23   --   GLUCOSE  --  168*  --  136*  --  127*  --   BUN  --  151*  --  156*  --  87*  --   CREATININE  --  4.27*  --  4.35*  --  2.51*  --   CALCIUM  --  6.5*  --  6.3*  --  6.6*  --   PHOS  --   --  9.5* 9.8*  --  7.7*  --    < > = values in this interval not displayed.   Liver Function Tests: Recent Labs  Lab 03/19/19 1727 03/20/19 0419  ALBUMIN 1.3* 1.4*   No results for input(s): LIPASE, AMYLASE in the last 168 hours. No results for input(s): AMMONIA in the last 168 hours. CBC: Recent Labs  Lab 03/16/19 0518 03/17/19 0500 03/18/19 0616 03/19/19 0430 03/20/19 0020 03/20/19 0419 03/20/19 0555  WBC 10.7* 2.6* 12.6* 16.5*  --  18.1*  --   HGB 11.2* 10.4* 9.8* 7.3* 8.8* 9.2* 9.9*  HCT 36.7 36.5 31.9* 23.6* 26.0* 30.6* 29.0*  MCV 98.7 104.6* 98.8 98.7  --  100.3*  --   PLT 157 169 126* 67*  --  56*  --    Cardiac Enzymes: No results for input(s): CKTOTAL, CKMB, CKMBINDEX, TROPONINI in the last 168 hours. CBG: Recent Labs  Lab 03/19/19 1605 03/19/19 2000 03/19/19 2312 03/20/19 0412 03/20/19 0824  GLUCAP 125* 119* 106* 107* 106*    Iron Studies: No results for input(s): IRON, TIBC, TRANSFERRIN, FERRITIN in the last 72 hours. Studies/Results: DG Chest Port 1 View  Result Date: 03/20/2019 CLINICAL DATA:  [name].  Respiratory failure. EXAM: PORTABLE CHEST 1 VIEW COMPARISON:  03/19/2019 and earlier. FINDINGS: Prone supine portable view at 0644 hours. Stable endotracheal tube tip at the level the clavicles. Stable right PICC line. Tear tube courses to the abdomen, tip not included. Stable multiple lumen right IJ central line. Rotated to the left. Coarse and confluent widespread pulmonary opacity, only mildly sparing the right lung apex, appears stable. Most of the mediastinal contours are obscured. No acute osseous abnormality identified. IMPRESSION: 1.  Stable lines and tubes. 2. Severe bilateral COVID-19. pneumonia, ventilation appears stable since  yesterday allowing for rotation on this image. Electronically Signed   By: Genevie Ann M.D.   On: 03/20/2019 08:44   DG Chest Port 1 View  Result Date: 03/19/2019 CLINICAL DATA:  Acute respiratory failure with hypoxia. Central line placement. COVID-19. EXAM: PORTABLE CHEST 1 VIEW 3:33 p.m. COMPARISON:  03/19/2019 at 6:40 a.m. FINDINGS: New central venous catheter has been inserted and the tip is in the superior vena cava at the level of the aortic arch. No pneumothorax. Right PICC tip is in the superior vena cava 4 cm below the carina in good position. Endotracheal tube is 5 cm above the carina in good position. NG tube tip is below the diaphragm in good position. The extensive bilateral pulmonary infiltrates have slightly progressed on the left. No change on the right. Dense consolidation bilaterally with air bronchograms. Heart size and vascularity are normal. No effusions. IMPRESSION: 1. New central line tip is in the superior vena cava at the level of the aortic arch. No pneumothorax. 2. Extensive bilateral pulmonary infiltrates, slightly progressed on the left. No change on the right. Electronically Signed   By: Lorriane Shire M.D.   On: 03/19/2019 16:04   DG CHEST PORT 1 VIEW  Result Date: 03/19/2019 CLINICAL DATA:  [name]. PICC line position questioned. EXAM: PORTABLE CHEST 1 VIEW COMPARISON:  Portable chest 03/17/2019 and earlier. FINDINGS: Prone portable chest at 0640 hours. The patient is more rotated to the right. Right upper extremity approach PICC line tip appears stable at the lower SVC level. Stable endotracheal tube tip between the level the clavicles and carina. Enteric tube courses to the abdomen, tip not included. Lower thoracic spinal stimulator again noted. Diffuse coarse and confluent pulmonary opacity appears not significantly changed. No superimposed pneumothorax or pleural effusion is evident. Stable visible mediastinal contours. IMPRESSION: 1.  Right PICC line position appears stable, tip at the lower SVC level. Stable lines and tubes. 2. Severe COVID-19 pneumonia appears stable since 03/17/2019. Electronically Signed   By: Genevie Ann M.D.   On: 03/19/2019 07:15   Medications: Infusions: . sodium chloride Stopped (03/16/19 1749)  . amiodarone 60 mg/hr (03/20/19 0802)  . dextrose 30 mL/hr at 03/20/19 0700  . feeding supplement (VITAL 1.5 CAL) Stopped (03/18/19 1400)  . fentaNYL infusion INTRAVENOUS 200 mcg/hr (03/20/19 0700)  . levETIRAcetam Stopped (03/19/19 2139)  . midazolam 2 mg/hr (03/20/19 0700)  .  norepinephrine (LEVOPHED) Adult infusion 40 mcg/min (03/20/19 0700)  . piperacillin-tazobactam Stopped (03/20/19 0438)  . prismasol BGK 2/2.5 dialysis solution 2,000 mL/hr at 03/20/19 0706  . prismasol BGK 2/2.5 replacement solution 500 mL/hr at 03/20/19 0421  . prismasol BGK 2/2.5 replacement solution 300 mL/hr at 03/19/19 1744  . vancomycin    . vasopressin (PITRESSIN) infusion - *FOR SHOCK* 0.04 Units/min (03/20/19 0700)  . vecuronium (NORCURON) infusion 1.2 mcg/kg/min (03/20/19 0700)    Scheduled Medications: . artificial tears  1 application Both Eyes T9E  . chlorhexidine gluconate (MEDLINE KIT)  15 mL Mouth Rinse BID  . Chlorhexidine Gluconate Cloth  6 each Topical Daily  . famotidine  20 mg Per Tube Daily  . feeding supplement (PRO-STAT SUGAR FREE 64)  60 mL Per Tube BID  . hydrocortisone sod succinate (SOLU-CORTEF) inj  50 mg Intravenous Q6H  . insulin aspart  0-20 Units Subcutaneous Q4H  . levothyroxine  100 mcg Per Tube Q0600  . mouth rinse  15 mL Mouth Rinse 10 times per day  . polyethylene glycol  17 g Per Tube BID  . sodium chloride flush  10-40 mL Intracatheter Q12H  . vecuronium  10 mg Intravenous Once  . vitamin C  500 mg Per Tube Daily  . zinc sulfate  220 mg Per Tube Daily    have reviewed scheduled and prn medications.  Physical Exam: Full exam not done due to covid isolation.  This is in effort to  preserve PPE and to limit exposure to other providers-  vascath placed on 12/24   03/20/2019,8:47 AM  LOS: 17 days

## 2019-03-20 NOTE — Progress Notes (Signed)
Et tube advanced, per order, 2 cm.  Now @ ~ 27 cm, measured from lip.

## 2019-03-20 NOTE — Progress Notes (Signed)
Brief Neurology Note :   I have reviewed electronic medical records and imaging films personally.  Patient remains with refractory respiratory failure requiring to be in prone position with sedation and paralysis which greatly limits ability to do a neurological exam .  Hence stroke team will follow from distance and not see patient daily.  When patient is stable enough to be able to be transported to get a follow-up imaging call us back to see the patient. Discussed with Dr. Rodman Pickle.  Stroke team will sign off for now but will be available once needed in the future Antony Contras, MD Medical Director [facility] Pager: [phone] 03/20/2019 12:30 PM

## 2019-03-20 NOTE — Progress Notes (Addendum)
Arterial blood gas drawn on the following setting: PRVC35/400/100%+20

## 2019-03-21 LAB — RENAL FUNCTION PANEL
Albumin: 1.3 g/dL — ABNORMAL LOW (ref 3.5–5.0)
Anion gap: 20 — ABNORMAL HIGH (ref 5–15)
BUN: 43 mg/dL — ABNORMAL HIGH (ref 6–20)
CO2: 16 mmol/L — ABNORMAL LOW (ref 22–32)
Calcium: 6.4 mg/dL — CL (ref 8.9–10.3)
Chloride: 102 mmol/L (ref 98–111)
Creatinine, Ser: 1.48 mg/dL — ABNORMAL HIGH (ref 0.44–1.00)
GFR calc Af Amer: 44 mL/min — ABNORMAL LOW (ref >60)
GFR calc non Af Amer: 38 mL/min — ABNORMAL LOW (ref >60)
Glucose, Bld: 70 mg/dL (ref 70–99)
Phosphorus: 7.9 mg/dL — ABNORMAL HIGH (ref 2.5–4.6)
Potassium: 5.1 mmol/L (ref 3.5–5.1)
Sodium: 138 mmol/L (ref 135–145)

## 2019-03-21 LAB — CBC
HCT: 27.2 % — ABNORMAL LOW (ref 36.0–46.0)
Hemoglobin: 7.8 g/dL — ABNORMAL LOW (ref 12.0–15.0)
MCH: 30.4 pg (ref 26.0–34.0)
MCHC: 28.7 g/dL — ABNORMAL LOW (ref 30.0–36.0)
MCV: 105.8 fL — ABNORMAL HIGH (ref 80.0–100.0)
Platelets: 37 10*3/uL — ABNORMAL LOW (ref 150–400)
RBC: 2.57 MIL/uL — ABNORMAL LOW (ref 3.87–5.11)
RDW: 16.9 % — ABNORMAL HIGH (ref 11.5–15.5)
WBC: 8.7 10*3/uL (ref 4.0–10.5)
nRBC: 2.2 % — ABNORMAL HIGH (ref 0.0–0.2)

## 2019-03-21 LAB — GLUCOSE, CAPILLARY
Glucose-Capillary: 89 mg/dL (ref 70–99)
Glucose-Capillary: 90 mg/dL (ref 70–99)
Glucose-Capillary: <10 mg/dL — CL (ref 70–99)

## 2019-03-21 LAB — PROTIME-INR
INR: 2.1 — ABNORMAL HIGH (ref 0.8–1.2)
Prothrombin Time: 23.9 seconds — ABNORMAL HIGH (ref 11.4–15.2)

## 2019-03-21 LAB — POCT I-STAT 7, (LYTES, BLD GAS, ICA,H+H)
Acid-base deficit: 15 mmol/L — ABNORMAL HIGH (ref 0.0–2.0)
Bicarbonate: 15.7 mmol/L — ABNORMAL LOW (ref 20.0–28.0)
Calcium, Ion: 0.87 mmol/L — CL (ref 1.15–1.40)
HCT: 23 % — ABNORMAL LOW (ref 36.0–46.0)
Hemoglobin: 7.8 g/dL — ABNORMAL LOW (ref 12.0–15.0)
O2 Saturation: 85 %
Patient temperature: 36.5
Potassium: 5 mmol/L (ref 3.5–5.1)
Sodium: 134 mmol/L — ABNORMAL LOW (ref 135–145)
TCO2: 18 mmol/L — ABNORMAL LOW (ref 22–32)
pCO2 arterial: 63.5 mmHg — ABNORMAL HIGH (ref 32.0–48.0)
pH, Arterial: 6.997 — CL (ref 7.350–7.450)
pO2, Arterial: 74 mmHg — ABNORMAL LOW (ref 83.0–108.0)

## 2019-03-21 LAB — MAGNESIUM: Magnesium: 2.4 mg/dL (ref 1.7–2.4)

## 2019-03-21 MED ORDER — DEXTROSE 50 % IV SOLN
INTRAVENOUS | Status: AC
  Administered 2019-03-21: 50 mL
  Filled 2019-03-21: qty 50

## 2019-03-21 MED ORDER — FENTANYL CITRATE (PF) 2500 MCG/50ML IJ SOLN
50.0000 ug/h | Status: DC
  Administered 2019-03-21 (×2): 200 ug/h via INTRAVENOUS
  Filled 2019-03-21 (×3): qty 100

## 2019-03-22 ENCOUNTER — Encounter: Payer: Self-pay | Admitting: Physician Assistant

## 2019-03-22 LAB — CULTURE, RESPIRATORY W GRAM STAIN

## 2019-03-23 LAB — POCT I-STAT 7, (LYTES, BLD GAS, ICA,H+H)
Acid-base deficit: 9 mmol/L — ABNORMAL HIGH (ref 0.0–2.0)
Bicarbonate: 20.6 mmol/L (ref 20.0–28.0)
Calcium, Ion: 0.92 mmol/L — ABNORMAL LOW (ref 1.15–1.40)
HCT: 24 % — ABNORMAL LOW (ref 36.0–46.0)
Hemoglobin: 8.2 g/dL — ABNORMAL LOW (ref 12.0–15.0)
O2 Saturation: 83 %
Patient temperature: 97.7
Potassium: 5 mmol/L (ref 3.5–5.1)
Sodium: 134 mmol/L — ABNORMAL LOW (ref 135–145)
TCO2: 23 mmol/L (ref 22–32)
pCO2 arterial: 66.9 mmHg (ref 32.0–48.0)
pH, Arterial: 7.094 — CL (ref 7.350–7.450)
pO2, Arterial: 65 mmHg — ABNORMAL LOW (ref 83.0–108.0)

## 2019-03-23 LAB — CULTURE, BLOOD (ROUTINE X 2)
Culture: NO GROWTH
Culture: NO GROWTH

## 2019-03-25 ENCOUNTER — Telehealth: Payer: Self-pay

## 2019-03-25 NOTE — Telephone Encounter (Signed)
Received dc from [facility] (faxed copy)   Dc is for cremation and a patient of Doctor Nelda Marseille.   DC will be taken to Pulmonary Unit for signature.

## 2019-03-26 NOTE — Telephone Encounter (Signed)
Received signed faxed D/C-faxed to [facility] as requested.

## 2019-03-27 NOTE — Progress Notes (Signed)
Subjective:  remains on CRRT-    Still hemodynamically unstable on pressors - numbers much improved on CRRT - still ended up being positive fluid balance   Objective Vital signs in last 24 hours: Vitals:   03/29/2019 0400 03/29/2019 0500 03-29-19 0600 03/29/2019 0700  BP: (!) 86/41 (!) 77/39 (!) 80/30 (!) 90/30  Pulse: 75 73 72 74  Resp: (!) 35 (!) 35 (!) 35 (!) 35  Temp: 98.1 F (36.7 C) 97.7 F (36.5 C) (!) 97.5 F (36.4 C) (!) 97.3 F (36.3 C)  TempSrc:      SpO2: (!) 84% (!) 81% (!) 83% (!) 86%  Weight:      Height:       Weight change:   Intake/Output Summary (Last 24 hours) at 2019/03/29 0831 Last data filed at 2019/03/29 0800 Gross per 24 hour  Intake 4869.28 ml  Output 4093 ml  Net 776.28 ml      Assessment/Plan: 59 year old white female minimal medical conditions but with severe case of Covid with multiple complications.  Now with acute kidney injury 1.Renal-acute kidney injury related to hemodynamic instability related to severe Covid infection.  Anuric with uremia, acidosis (albeit resp)  and hyperkalemia.  Wanted to continue aggressive care so started CRRT on 12/24  No heparin secondary to [place]- 2 K bath- running well overnight-  No change to fluids for now.  If clotting of machine becomes an issue may need to do citrate as cannot do heparin 2. Hypertension/volume  -hypotensive on pressors.  Not making urine and is on 90% FiO2 on vent.  Will attempt to pull some fluid  50-100 per hour- has not been successful I imagine due to low BP  3.  Elytes- cont  2 potassium bath.  Suspect phosphorus will come down with CRRT 4. Anemia  -supportive care.  Transfuse when needed- stable for now  5.  Dispo-  Unfortunately not much clinical improvement with addition of CRRT-  I would think prognosis is poor.  Per CCM     Louis Meckel    Labs: Basic Metabolic Panel: Recent Labs  Lab 03/20/19 0419 03/20/19 1717 03/20/19 2051 03/29/19 0505 03/29/19 0609  NA 141 139 136  138 134*  K 4.6 5.2* 4.7 5.1 5.0  CL 102 102  --  102  --   CO2 23 19*  --  16*  --   GLUCOSE 127* 125*  --  70  --   BUN 87* 61*  --  43*  --   CREATININE 2.51* 1.93*  --  1.48*  --   CALCIUM 6.6* 6.5*  --  6.4*  --   PHOS 7.7* 8.1*  --  7.9*  --    Liver Function Tests: Recent Labs  Lab 03/20/19 0419 03/20/19 1717 29-Mar-2019 0505  ALBUMIN 1.4* 1.3* 1.3*   No results for input(s): LIPASE, AMYLASE in the last 168 hours. No results for input(s): AMMONIA in the last 168 hours. CBC: Recent Labs  Lab 03/17/19 0500 03/18/19 0616 03/19/19 0430 03/20/19 0419 03/20/19 2051 2019-03-29 0505 2019-03-29 0609  WBC 2.6* 12.6* 16.5* 18.1*  --  8.7  --   HGB 10.4* 9.8* 7.3* 9.2* 8.5* 7.8* 7.8*  HCT 36.5 31.9* 23.6* 30.6* 25.0* 27.2* 23.0*  MCV 104.6* 98.8 98.7 100.3*  --  105.8*  --   PLT 169 126* 67* 56*  --  37*  --    Cardiac Enzymes: No results for input(s): CKTOTAL, CKMB, CKMBINDEX, TROPONINI in the last 168 hours.  CBG: Recent Labs  Lab 03/20/19 1146 03/20/19 1550 03/20/19 1919 03/25/2019 0008 25-Mar-2019 0321  GLUCAP 92 122* 99 90 89    Iron Studies: No results for input(s): IRON, TIBC, TRANSFERRIN, FERRITIN in the last 72 hours. Studies/Results: DG CHEST PORT 1 VIEW  Result Date: 03/20/2019 CLINICAL DATA:  COVID-19 , intubation EXAM: PORTABLE CHEST 1 VIEW COMPARISON:  Portable exam 1655 hours compared to 0644 hours FINDINGS: Tip of endotracheal tube projects 8.0 cm above carina. Nasogastric tube extends into stomach. RIGHT arm PICC line with tip projecting over SVC. RIGHT jugular line tip projecting over confluence of SVC and brachiocephalic vein. Intraspinal stimulator seen. Atherosclerotic calcification aorta. Heart obscured by diffuse BILATERAL airspace infiltrates which could represent multifocal pneumonia or ARDS. No pleural effusion or pneumothorax. IMPRESSION: Persistent diffuse BILATERAL airspace infiltrates either representing multifocal pneumonia or ARDS. Electronically  Signed   By: Lavonia Dana M.D.   On: 03/20/2019 17:20   DG Chest Port 1 View  Result Date: 03/20/2019 CLINICAL DATA:  59 year old female COVID-56.  Respiratory failure. EXAM: PORTABLE CHEST 1 VIEW COMPARISON:  03/19/2019 and earlier. FINDINGS: Prone supine portable view at 0644 hours. Stable endotracheal tube tip at the level the clavicles. Stable right PICC line. Tear tube courses to the abdomen, tip not included. Stable multiple lumen right IJ central line. Rotated to the left. Coarse and confluent widespread pulmonary opacity, only mildly sparing the right lung apex, appears stable. Most of the mediastinal contours are obscured. No acute osseous abnormality identified. IMPRESSION: 1.  Stable lines and tubes. 2. Severe bilateral COVID-19. pneumonia, ventilation appears stable since yesterday allowing for rotation on this image. Electronically Signed   By: Genevie Ann M.D.   On: 03/20/2019 08:44   DG Chest Port 1 View  Result Date: 03/19/2019 CLINICAL DATA:  Acute respiratory failure with hypoxia. Central line placement. COVID-19. EXAM: PORTABLE CHEST 1 VIEW 3:33 p.m. COMPARISON:  03/19/2019 at 6:40 a.m. FINDINGS: New central venous catheter has been inserted and the tip is in the superior vena cava at the level of the aortic arch. No pneumothorax. Right PICC tip is in the superior vena cava 4 cm below the carina in good position. Endotracheal tube is 5 cm above the carina in good position. NG tube tip is below the diaphragm in good position. The extensive bilateral pulmonary infiltrates have slightly progressed on the left. No change on the right. Dense consolidation bilaterally with air bronchograms. Heart size and vascularity are normal. No effusions. IMPRESSION: 1. New central line tip is in the superior vena cava at the level of the aortic arch. No pneumothorax. 2. Extensive bilateral pulmonary infiltrates, slightly progressed on the left. No change on the right. Electronically Signed   By: Lorriane Shire  M.D.   On: 03/19/2019 16:04   Medications: Infusions: . sodium chloride Stopped (03/16/19 1749)  . amiodarone 30 mg/hr (2019/03/25 0800)  . dextrose 30 mL/hr at 2019/03/25 0824  . epinephrine 11 mcg/min (25-Mar-2019 0800)  . feeding supplement (NEPRO CARB STEADY) Stopped (25-Mar-2019 0617)  . feeding supplement (VITAL 1.5 CAL) Stopped (03/18/19 1400)  . fentaNYL infusion INTRAVENOUS 400 mcg/hr (03/25/2019 0800)  . midazolam 2 mg/hr (March 25, 2019 0800)  . norepinephrine (LEVOPHED) Adult infusion 70 mcg/min (03/25/2019 0800)  . piperacillin-tazobactam Stopped (2019/03/25 0347)  . prismasol BGK 2/2.5 dialysis solution 2,000 mL/hr at Mar 25, 2019 0325  . prismasol BGK 2/2.5 replacement solution 500 mL/hr at Mar 25, 2019 0339  . prismasol BGK 2/2.5 replacement solution 300 mL/hr at 25-Mar-2019 0816  . vancomycin 1,000 mg (  2019-04-19 0805)  . vasopressin (PITRESSIN) infusion - *FOR SHOCK* 0.04 Units/min (2019-04-19 0800)  . vecuronium (NORCURON) infusion 1.2 mcg/kg/min (04-19-19 0800)    Scheduled Medications: . dextrose      . artificial tears  1 application Both Eyes O3F  . chlorhexidine gluconate (MEDLINE KIT)  15 mL Mouth Rinse BID  . Chlorhexidine Gluconate Cloth  6 each Topical Daily  . famotidine  20 mg Per Tube Daily  . feeding supplement (PRO-STAT SUGAR FREE 64)  60 mL Per Tube BID  . hydrocortisone sod succinate (SOLU-CORTEF) inj  50 mg Intravenous Q6H  . insulin aspart  0-20 Units Subcutaneous Q4H  . levETIRAcetam  500 mg Per Tube BID  . levothyroxine  100 mcg Per Tube Q0600  . mouth rinse  15 mL Mouth Rinse 10 times per day  . sodium chloride flush  10-40 mL Intracatheter Q12H  . vecuronium  10 mg Intravenous Once  . vitamin C  500 mg Per Tube Daily  . zinc sulfate  220 mg Per Tube Daily    have reviewed scheduled and prn medications.  Physical Exam: Full exam not done due to covid isolation.  This is in effort to preserve PPE and to limit exposure to other providers-  vascath placed on 12/24    April 19, 2019,8:31 AM  LOS: 18 days

## 2019-03-27 NOTE — Progress Notes (Signed)
Pt found with green bile oozing from mouth. Tube feeding stopped at this time and OG placed to [facility]

## 2019-03-27 NOTE — Progress Notes (Signed)
Called by bedside RN and RT.  Patient is desaturating to the 50s and there is little to no return volume.  Attempted to place more air in the cuff with no avail.  Changed the ETT however, during the process patient desaturated to the 30s and lost her pulse.  Code status reviewed, meds only.  Patient already on epi.  Attempted increasing dose with no avail.  Patient declared dead and husband notified over the phone.  The patient is critically ill with multiple organ systems failure and requires high complexity decision making for assessment and support, frequent evaluation and titration of therapies, application of advanced monitoring technologies and extensive interpretation of multiple databases.   Critical Care Time devoted to patient care services described in this note is  38  Minutes. This time reflects time of care of this signee Dr [name]. This critical care time does not reflect procedure time, or teaching time or supervisory time of PA/NP/Med student/Med Resident etc but could involve care discussion time.  Rush Farmer, M.D. [facility], [place] [facility].

## 2019-03-27 NOTE — Progress Notes (Signed)
There is a mismatch with the ml/hr for the fentanyl gtt in Epic and IV pump. Epic's MAR showing a rate of 20 ml/hr, pump showing a rate of 4 ml/hr.   Both the MAR and pump display a concentration of 5000 mcg/100 ml.  Notified pharmacy they confirmed that 4 ml/hr is the correct rate and the pump was correct, there was an error is the way the Fentanyl order was written.   Order was corrected by pharmacy and both Epic's MAR and the pump are now correlating appropriately.

## 2019-03-27 NOTE — Procedures (Signed)
Intubation Procedure Note Bryanne Riquelme [id number] 03/03/1961  Procedure: Intubation Indications: Respiratory insufficiency  Procedure Details Consent: Unable to obtain consent because of emergent medical necessity. Time Out: Verified patient identification, verified procedure, site/side was marked, verified correct patient position, special equipment/implants available, medications/allergies/relevent history reviewed, required imaging and test results available.  Performed  Maximum sterile technique was used including antiseptics, cap, gloves, gown, hand hygiene, mask and sheet.  Changed over a tube changer x2    Evaluation Hemodynamic Status: Persistent hypotension treated with pressors; O2 sats: Persistent severe hypoxemia Patient's Current Condition: unstable Complications: No apparent complications Patient did not tolerate procedure well. Chest X-ray ordered to verify placement.  CXR: pending.   [name] 03/07/2019

## 2019-03-27 DEATH — deceased

## 2019-04-27 NOTE — Death Summary Note (Signed)
DEATH SUMMARY   Patient Details  Name: Deborah Bond MRN: [id number] DOB: Apr 28, 1960  Admission/Discharge Information   Admit Date:  03/25/19  Date of Death: Date of Death: 2019/04/12  Time of Death: Time of Death: 0920  Length of Stay: Aug 03, 2022  Referring Physician: Remus Loffler, PA-C   Reason(s) for Hospitalization  COVID-19 ARDS  Diagnoses  Preliminary cause of death:   COVID-19 ARDS Secondary Diagnoses (including complications and co-morbidities):  Principal Problem:   Acute respiratory failure with hypoxia (HCC) Active Problems:   DDD (degenerative disc disease), lumbar   Acquired hypothyroidism   Pure hypercholesterolemia   Depression, major, single episode, moderate (HCC)   Pneumonia due to COVID-19 virus   Hypokalemia   Hyponatremia   Acute hypoxemic respiratory failure due to COVID-19 (HCC)   Pressure injury of skin   Nontraumatic intracerebral hemorrhage (HCC)   On mechanically assisted ventilation (HCC)   HCAP (healthcare-associated pneumonia)   Hypotension   Acute respiratory disease due to COVID-19 virus   Encephalopathy acute   Brief Hospital Course (including significant findings, care, treatment, and services provided and events leading to death)  59 y/o female admitted on 03/25/2023 with dyspnea, cough and hypoxemia due to COVID 19.  Moved to [facility] on 12/9.  Early in the AM on 12/10 she was moved to the ICU for worsening hypoxemia.    Patient was treated in the ICU and on the morning of 2023-04-12 ETT cuff ruptured.  ETT was changed but patient did not recover back from desaturation induced by that and had a cardiac arrest.  Patient was a limited code and did not survive.    Pertinent Labs and Studies  Significant Diagnostic Studies CT HEAD WO CONTRAST  Result Date: 03/15/2019 CLINICAL DATA:  Follow-up examination for intracranial hemorrhage. EXAM: CT HEAD WITHOUT CONTRAST TECHNIQUE: Contiguous axial images were obtained from the base of the skull through  the vertex without intravenous contrast. COMPARISON:  Prior CT from 03/14/2019. FINDINGS: Brain: Previously identified hemorrhage positioned at the left posterior fossa again seen, not significantly changed in size and morphology as compared to previous. Similar regional mass effect with partial effacement of the fourth ventricle and fourth ventricular outflow tract. There has been interval placement of a right frontal approach ventriculostomy with tip positioned within the right lateral ventricle near the foramen of Monro. Small amount of hemorrhage seen along the catheter tract. Previously seen hydrocephalus has resolved, with interval decompression of the ventricular system. No other new acute intracranial hemorrhage. No acute large vessel territory infarct. Previously noted hypodensity involving the occipital regions bilaterally felt to be artifactual on prior exam. No midline shift. No extra-axial fluid collection. Vascular: No hyperdense vessel. Skull: Right frontal approach ventriculostomy. Sinuses/Orbits: Globes and orbital soft tissues within normal limits. Mucosal thickening and opacity within the posterior ethmoidal air cells and sphenoid sinuses, with fluid seen layering within the nasopharynx. Bilateral mastoid effusions. Patient is intubated. Other: None. IMPRESSION: 1. Interval placement of right frontal approach ventriculostomy with tip positioned within the right lateral ventricle near the foramen of Monro. Previously seen hydrocephalus has resolved, with interval decompression of the ventricular system. 2. No significant interval change in size and morphology of acute left posterior fossa hemorrhage with similar regional mass effect. 3. No other new acute intracranial abnormality. Electronically Signed   By: Rise Mu M.D.   On: 03/15/2019 02:51   CT HEAD WO CONTRAST  Result Date: 03/14/2019 CLINICAL DATA:  Altered mental status.  COVID-19 infection. EXAM: CT HEAD WITHOUT  CONTRAST  TECHNIQUE: Contiguous axial images were obtained from the base of the skull through the vertex without intravenous contrast. COMPARISON:  None. FINDINGS: Brain: Acute predominantly subdural hematoma in the left posterior fossa measuring up to 3.0 cm. Suspected small intraparenchymal hemorrhage within the left cerebellum as well. Resultant mass effect on the cerebellum with effacement of the fourth ventricle and brainstem. Patchy hypodensity in both occipital lobes. Early hydrocephalus with enlargement of the lateral ventricle temporal and occipital horns. Vascular: No hyperdense vessel or unexpected calcification. Skull: Normal. Negative for fracture or focal lesion. Sinuses/Orbits: Partial opacification of the bilateral sphenoid sinuses and mastoid air cells. Other: Partially visualized endotracheal and enteric tubes. IMPRESSION: 1. Acute predominantly subdural hematoma in the left posterior fossa measuring up to 3.0 cm. Suspected small intraparenchymal hemorrhage within the left cerebellum as well. Effacement of the fourth ventricle and brainstem with early hydrocephalus. Emergent neurosurgery consultation recommended. 2. Patchy hypodensity in both occipital lobes, concerning for infarct. Given COVID-19 infection, constellation of findings may be related to underlying venous thrombosis. Follow-up MRI and CTV suggested after decompression. Critical Value/emergent results were called by telephone at the time of interpretation on 03/14/2019 at 11:30 am to [facility] , who verbally acknowledged these results. Electronically Signed   By: Obie DredgeWilliam T Derry M.D.   On: 03/14/2019 11:40   DG CHEST PORT 1 VIEW  Result Date: 03/20/2019 CLINICAL DATA:  COVID-19 , intubation EXAM: PORTABLE CHEST 1 VIEW COMPARISON:  Portable exam 1655 hours compared to 0644 hours FINDINGS: Tip of endotracheal tube projects 8.0 cm above carina. Nasogastric tube extends into stomach. RIGHT arm PICC line with tip projecting over SVC.  RIGHT jugular line tip projecting over confluence of SVC and brachiocephalic vein. Intraspinal stimulator seen. Atherosclerotic calcification aorta. Heart obscured by diffuse BILATERAL airspace infiltrates which could represent multifocal pneumonia or ARDS. No pleural effusion or pneumothorax. IMPRESSION: Persistent diffuse BILATERAL airspace infiltrates either representing multifocal pneumonia or ARDS. Electronically Signed   By: Ulyses SouthwardMark  Boles M.D.   On: 03/20/2019 17:20   DG Chest Port 1 View  Result Date: 03/20/2019 CLINICAL DATA:  59 year old female COVID-19.  Respiratory failure. EXAM: PORTABLE CHEST 1 VIEW COMPARISON:  03/19/2019 and earlier. FINDINGS: Prone supine portable view at 0644 hours. Stable endotracheal tube tip at the level the clavicles. Stable right PICC line. Tear tube courses to the abdomen, tip not included. Stable multiple lumen right IJ central line. Rotated to the left. Coarse and confluent widespread pulmonary opacity, only mildly sparing the right lung apex, appears stable. Most of the mediastinal contours are obscured. No acute osseous abnormality identified. IMPRESSION: 1.  Stable lines and tubes. 2. Severe bilateral COVID-19. pneumonia, ventilation appears stable since yesterday allowing for rotation on this image. Electronically Signed   By: Odessa FlemingH  Hall M.D.   On: 03/20/2019 08:44   DG Chest Port 1 View  Result Date: 03/19/2019 CLINICAL DATA:  Acute respiratory failure with hypoxia. Central line placement. COVID-19. EXAM: PORTABLE CHEST 1 VIEW 3:33 p.m. COMPARISON:  03/19/2019 at 6:40 a.m. FINDINGS: New central venous catheter has been inserted and the tip is in the superior vena cava at the level of the aortic arch. No pneumothorax. Right PICC tip is in the superior vena cava 4 cm below the carina in good position. Endotracheal tube is 5 cm above the carina in good position. NG tube tip is below the diaphragm in good position. The extensive bilateral pulmonary infiltrates have  slightly progressed on the left. No change on the right. Dense consolidation bilaterally  with air bronchograms. Heart size and vascularity are normal. No effusions. IMPRESSION: 1. New central line tip is in the superior vena cava at the level of the aortic arch. No pneumothorax. 2. Extensive bilateral pulmonary infiltrates, slightly progressed on the left. No change on the right. Electronically Signed   By: Francene Boyers M.D.   On: 03/19/2019 16:04   DG CHEST PORT 1 VIEW  Result Date: 03/19/2019 CLINICAL DATA:  59 year old female COVID-19. Respiratory failure. PICC line position questioned. EXAM: PORTABLE CHEST 1 VIEW COMPARISON:  Portable chest 03/17/2019 and earlier. FINDINGS: Prone portable chest at 0640 hours. The patient is more rotated to the right. Right upper extremity approach PICC line tip appears stable at the lower SVC level. Stable endotracheal tube tip between the level the clavicles and carina. Enteric tube courses to the abdomen, tip not included. Lower thoracic spinal stimulator again noted. Diffuse coarse and confluent pulmonary opacity appears not significantly changed. No superimposed pneumothorax or pleural effusion is evident. Stable visible mediastinal contours. IMPRESSION: 1. Right PICC line position appears stable, tip at the lower SVC level. Stable lines and tubes. 2. Severe COVID-19 pneumonia appears stable since 03/17/2019. Electronically Signed   By: Odessa Fleming M.D.   On: 03/19/2019 07:15   DG Chest Port 1 View  Result Date: 03/17/2019 CLINICAL DATA:  ET tube, COVID positive.  Respiratory failure. EXAM: PORTABLE CHEST 1 VIEW COMPARISON:  03/16/2019 FINDINGS: Endotracheal tube, right PICC line and NG tube remain in place, unchanged. Severe diffuse bilateral airspace disease again noted, minimally improved in the right upper lobe, otherwise no change. No visible effusions or pneumothorax. No acute bony abnormality. IMPRESSION: Severe diffuse bilateral airspace disease with minimal  improvement in the right upper lobe. Otherwise no change. Electronically Signed   By: Charlett Nose M.D.   On: 03/17/2019 09:02   DG Chest Port 1 View  Result Date: 03/16/2019 CLINICAL DATA:  59 year old female with respiratory distress. Positive COVID-19. EXAM: PORTABLE CHEST 1 VIEW COMPARISON:  Earlier radiograph dated 03/16/2019. FINDINGS: Endotracheal tube above the carina, right-sided PICC in similar position, and enteric tube extending below the diaphragm with tip beyond the inferior margin of the image. Overall worsening of the bilateral airspace opacities compared to the earlier radiograph. No pneumothorax. No acute osseous pathology. IMPRESSION: 1. Overall worsening of the bilateral airspace opacities compared to the earlier radiograph. 2. Stable positioning of the support lines and tubes. Electronically Signed   By: Elgie Collard M.D.   On: 03/16/2019 23:57   DG Chest Port 1 View  Result Date: 03/16/2019 CLINICAL DATA:  59 year old female with history of respiratory failure. EXAM: PORTABLE CHEST 1 VIEW COMPARISON:  Chest x-ray 03/14/2019. FINDINGS: An endotracheal tube is in place with tip 2.5 cm above the carina. There is a right upper extremity PICC with tip terminating in the right atrium. A nasogastric tube is seen extending into the stomach, however, the tip of the nasogastric tube extends below the lower margin of the image. Spinal cord stimulator projecting over the lower thoracic and lumbar regions. Patchy multifocal ill-defined ground-glass and consolidative opacities throughout the lungs bilaterally, most severe throughout the mid to lower lungs where there is also diffuse interstitial prominence. Overall, aeration has worsened, with multiple air bronchograms noted on today's examination. No definite pleural effusions. No pneumothorax. Cardiac silhouette is largely obscured. Aortic atherosclerosis. IMPRESSION: 1. Support apparatus, as above. 2. Worsening aeration in the lungs,  compatible with severe progressive multilobar bilateral pneumonia. 3. Aortic atherosclerosis. Electronically Signed   By:  Trudie Reed M.D.   On: 03/16/2019 16:42   DG CHEST PORT 1 VIEW  Result Date: 03/14/2019 CLINICAL DATA:  Acute respiratory syndrome.  COVID-19. EXAM: PORTABLE CHEST 1 VIEW COMPARISON:  March 10, 2019 FINDINGS: The ETT is in good position. The NG tube terminates below today's film. No pneumothorax. Stable right PICC line. Diffuse bilateral pulmonary infiltrates are stable in the interval. IMPRESSION: 1. Support apparatus as above. 2. Diffuse bilateral pulmonary infiltrates are not significantly changed in the interval. Electronically Signed   By: Gerome Sam III M.D   On: 03/14/2019 15:11   DG Chest Port 1 View  Result Date: 03/10/2019 CLINICAL DATA:  Acute hypoxia, respiratory failure, ventilatory support EXAM: PORTABLE CHEST 1 VIEW COMPARISON:  03/09/2019 FINDINGS: Endotracheal tube remains 3.8 cm above the carina. NG tube extends into the stomach with the tip not visualized. Thoracic stimulator noted. Slight improvement in bilateral diffuse airspace opacities and mid lower lungs. Hemidiaphragms are slightly better visualized. No enlarging effusion or pneumothorax. Trachea midline. Aorta atherosclerotic. Right PICC line tip SVC RA junction level. IMPRESSION: Stable support apparatus Slight improvement in bilateral airspace and interstitial opacities. Electronically Signed   By: Judie Petit.  Shick M.D.   On: 03/10/2019 08:09   DG CHEST PORT 1 VIEW  Result Date: 03/09/2019 CLINICAL DATA:  On mechanically assisted ventilation.  COVID-19 EXAM: PORTABLE CHEST 1 VIEW COMPARISON:  Radiograph yesterday, additional priors. FINDINGS: Endotracheal tube tip 3.9 cm from the carina at the thoracic inlet. Enteric tube tip below the diaphragm not included in the field of view. Right upper extremity PICC tip in the lower SVC. No significant change in bilateral lung opacities. Unchanged heart  size and mediastinal contours. No pneumothorax or large pleural effusion. Spinal stimulator partially included. IMPRESSION: 1. No significant change in bilateral lung opacities consistent with pneumonia. 2. Stable support apparatus. Electronically Signed   By: Narda Rutherford M.D.   On: 03/09/2019 04:53   DG CHEST PORT 1 VIEW  Result Date: 03/08/2019 CLINICAL DATA:  On ventilator. EXAM: PORTABLE CHEST 1 VIEW COMPARISON:  March 07, 2019. FINDINGS: Endotracheal and nasogastric tubes are unchanged in position. Right-sided PICC line is unchanged in position. No pneumothorax is noted. Stable bilateral lung opacities are noted consistent with multifocal pneumonia. Bony thorax is unremarkable. IMPRESSION: Stable support apparatus. Stable bilateral lung opacities are noted consistent with multifocal pneumonia. Electronically Signed   By: Lupita Raider M.D.   On: 03/08/2019 08:48   DG Chest Port 1 View  Result Date: 03/07/2019 CLINICAL DATA:  Respiratory distress EXAM: PORTABLE CHEST 1 VIEW COMPARISON:  Radiograph 03/06/2019 FINDINGS: Endotracheal tube and NG tube unchanged. Stable cardiac silhouette. Diffuse bilateral fine airspace disease is not changed. Low lung volumes. No pleural fluid. No pneumothorax. IMPRESSION: 1. Stable support apparatus. 2. Stable bilateral diffuse fine airspace disease. 3. No significant interval change. Electronically Signed   By: Genevive Bi M.D.   On: 03/07/2019 05:38   DG CHEST PORT 1 VIEW  Result Date: 03/06/2019 CLINICAL DATA:  PICC placement EXAM: PORTABLE CHEST 1 VIEW COMPARISON:  Radiograph 03/05/2019 FINDINGS: Endotracheal tube terminates in the mid trachea, 4 cm from the carina. Transesophageal tube tip and side port distal to the GE junction. Right upper extremity PICC with a tip at the level of the right atrium. Spinal nerve stimulator terminates at the level of T9. Widespread interstitial and airspace opacities throughout the lungs are grossly similar to  comparison exam. No convincing effusion or pneumothorax. Cardiomediastinal contour similar to prior. The aorta  is calcified. The remaining cardiomediastinal contours are unremarkable. IMPRESSION: Satisfactory positioning of the endotracheal and transesophageal tubes. Satisfactory positioning of a right upper extremity PICC tip in the right atrium/superior cavoatrial junction. Widespread interstitial and airspace opacities throughout the lungs are grossly similar to comparison exam. Aortic Atherosclerosis (ICD10-I70.0). Electronically Signed   By: Kreg ShropshirePrice  DeHay M.D.   On: 03/06/2019 16:24   DG CHEST PORT 1 VIEW  Result Date: 03/05/2019 CLINICAL DATA:  Acute respiratory disease due to COVID-19 virus EXAM: PORTABLE CHEST - 1 VIEW COMPARISON:  Apr 06, 2018 FINDINGS: Worsening diffuse interstitial and airspace opacities with progressive consolidation in the lung bases. Endotracheal tube has been placed, tip 2.4 cm above carina. Gastric tube extends to the decompressed stomach. No pneumothorax. No definite pleural effusion. Paired dorsal stimulator leads are partially visualized. Heart size difficult to assess due to adjacent opacities. Aortic Atherosclerosis (ICD10-170.0). Visualized bones unremarkable. IMPRESSION: 1. Worsening diffuse interstitial and airspace opacities with progressive consolidation in the lung bases. 2. Endotracheal tube tip 2.4 cm above the carina. Electronically Signed   By: Corlis Leak  Hassell M.D.   On: 03/05/2019 08:55   DG Chest Portable 1 View  Result Date: 03/20/2019 CLINICAL DATA:  COVID (+), shortness of breath. Additional provided: Multiple episodes of emesis with fatigue, shortness of breath and poor appetite. EXAM: PORTABLE CHEST 1 VIEW COMPARISON:  Report from chest radiograph 11/01/1998 (images unavailable). FINDINGS: Shallow inspiration radiograph which extension weights the cardiac silhouette and limits evaluation of heart size. Aortic atherosclerosis Prominent bilateral airspace  opacities within mid to lower lung predominance, likely reflecting pneumonia given provided history. There is also nonspecific diffuse bilateral interstitial prominence, which may reflect the same process. No sizable pleural effusion or pneumothorax. No acute bony abnormality. Spinal stimulator leads project over the lower thoracic spine. IMPRESSION: Prominent bilateral airspace opacities with a mid to lower lung predominance, likely reflecting pneumonia given provided history. Electronically Signed   By: Jackey LogeKyle  Golden DO   On: Apr 06, 2018 21:53   ECHOCARDIOGRAM COMPLETE  Result Date: 03/05/2019   ECHOCARDIOGRAM REPORT   Patient Name:   Serra ANN Santiesteban Date of Exam: 03/05/2019 Medical Rec #:  [id number]          Height:       60.0 in Accession #:    [id number]         Weight:       235.9 lb Date of Birth:  Dec 27, 1960         BSA:          2.00 m Patient Age:    58 years           BP:           120/63 mmHg Patient Gender: F                  HR:           81 bpm. Exam Location:  Inpatient Procedure: 2D Echo Indications:    Dyspnea 786.09 / R06.00  History:        Patient has no prior history of Echocardiogram examinations.  Sonographer:    Leta Junglingiffany Cooper RDCS Referring Phys: [phone] DOUGLAS B MCQUAID  Sonographer Comments: Echo performed with patient supine and on artificial respirator. Turned onto right side IMPRESSIONS  1. Left ventricular ejection fraction, by visual estimation, is 50 to 55%. The left ventricle has normal function. There is mildly increased left ventricular hypertrophy.  2. Global right ventricle has normal systolic function.The right ventricular size is normal.  3. Left atrial size was normal.  4. Right atrial size was normal.  5. The mitral valve is normal in structure. No evidence of mitral valve regurgitation. No evidence of mitral stenosis.  6. The tricuspid valve is normal in structure. Tricuspid valve regurgitation is trivial.  7. The aortic valve is normal in structure. Aortic valve  regurgitation is not visualized. No evidence of aortic valve sclerosis or stenosis.  8. The pulmonic valve was not well visualized. Pulmonic valve regurgitation is not visualized.  9. The inferior vena cava is normal in size with greater than 50% respiratory variability, suggesting right atrial pressure of 3 mmHg. 10. Technically difficult; normal LV function. 11. The left ventricle has no regional wall motion abnormalities. FINDINGS  Left Ventricle: Left ventricular ejection fraction, by visual estimation, is 50 to 55%. The left ventricle has normal function. The left ventricle has no regional wall motion abnormalities. There is mildly increased left ventricular hypertrophy. Normal left atrial pressure. Right Ventricle: The right ventricular size is normal. Global RV systolic function is has normal systolic function. Left Atrium: Left atrial size was normal in size. Right Atrium: Right atrial size was normal in size Pericardium: There is no evidence of pericardial effusion. Mitral Valve: The mitral valve is normal in structure. No evidence of mitral valve regurgitation. No evidence of mitral valve stenosis by observation. Tricuspid Valve: The tricuspid valve is normal in structure. Tricuspid valve regurgitation is trivial. Aortic Valve: The aortic valve is normal in structure. Aortic valve regurgitation is not visualized. The aortic valve is structurally normal, with no evidence of sclerosis or stenosis. Pulmonic Valve: The pulmonic valve was not well visualized. Pulmonic valve regurgitation is not visualized. Pulmonic regurgitation is not visualized. Aorta: The aortic root is normal in size and structure. Venous: The inferior vena cava is normal in size with greater than 50% respiratory variability, suggesting right atrial pressure of 3 mmHg.  Additional Comments: Technically difficult; normal LV function.  LEFT VENTRICLE PLAX 2D LVIDd:         5.00 cm  Diastology LVIDs:         3.40 cm  LV e' lateral:   6.42 cm/s  LV PW:         1.70 cm  LV E/e' lateral: 12.6 LV IVS:        1.22 cm  LV e' medial:    5.77 cm/s LVOT diam:     1.90 cm  LV E/e' medial:  14.0 LV SV:         71 ml LV SV Index:   32.24 LVOT Area:     2.84 cm  LEFT ATRIUM             Index LA diam:        3.50 cm 1.75 cm/m LA Vol (A2C):   34.2 ml 17.08 ml/m LA Vol (A4C):   41.0 ml 20.48 ml/m LA Biplane Vol: 38.8 ml 19.38 ml/m  AORTIC VALVE LVOT Vmax:   113.00 cm/s LVOT Vmean:  73.400 cm/s LVOT VTI:    0.205 m  AORTA Ao Root diam: 3.00 cm MITRAL VALVE MV Area (PHT): 3.31 cm             SHUNTS MV PHT:        66.41 msec           Systemic VTI:  0.20 m MV Decel Time: 229 msec             Systemic Diam: 1.90 cm MV E  velocity: 80.60 cm/s 103 cm/s MV A velocity: 59.00 cm/s 70.3 cm/s MV E/A ratio:  1.37       1.5  Olga Millers MD Electronically signed by Olga Millers MD Signature Date/Time: 03/05/2019/4:47:44 PM    Final    CT VENOGRAM HEAD  Result Date: 03/15/2019 CLINICAL DATA:  Concern for dural venous sinus thrombosis. Left-sided posterior fossa hemorrhage. EXAM: CT VENOGRAM HEAD TECHNIQUE: Multidetector CT imaging of the head was performed using the standard protocol during bolus administration of intravenous contrast. Multiplanar CT image reconstructions and MIPs were obtained to evaluate the vascular anatomy. CONTRAST:  75mL OMNIPAQUE IOHEXOL 350 MG/ML SOLN COMPARISON:  Head CT 03/15/2019 FINDINGS: The superior sagittal sinus, internal cerebral veins, vein of Galen, straight sinus, right transverse sinus, right sigmoid sinus, and right jugular bulb are patent without evidence of thrombus or significant stenosis. 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 However, where adequately visualized the left transverse and sigmoid sinuses as well as left jugular bulb appear grossly patent without a discrete filling defect identified to  clearly indicate a thrombus. 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 Electronically Signed   By: Sebastian Ache M.D.   On: 03/15/2019 14:19   VAS [place] LOWER EXTREMITY VENOUS (DVT)  Result Date: 03/16/2019  Lower Venous Study Indications: Covid-19 positive, COVID PACT trial research study.  Comparison Study: No priors. Performing Technologist: Marilynne Halsted RDMS, RVT  Examination Guidelines: A complete evaluation includes B-mode imaging, spectral Doppler, color Doppler, and power Doppler as needed of all accessible portions of each vessel. Bilateral testing is considered an integral part of a complete examination. Limited examinations for reoccurring indications may be performed as noted.  +---------+---------------+---------+-----------+----------+--------------+ RIGHT    CompressibilityPhasicitySpontaneityPropertiesThrombus Aging +---------+---------------+---------+-----------+----------+--------------+ CFV      Full           Yes      Yes                                 +---------+---------------+---------+-----------+----------+--------------+ SFJ      Full                                                        +---------+---------------+---------+-----------+----------+--------------+ FV Prox  Full                                                        +---------+---------------+---------+-----------+----------+--------------+ FV Mid   Full                                                        +---------+---------------+---------+-----------+----------+--------------+ FV DistalFull                                                        +---------+---------------+---------+-----------+----------+--------------+  PFV      Full                                                        +---------+---------------+---------+-----------+----------+--------------+ POP      Full           Yes       Yes                                 +---------+---------------+---------+-----------+----------+--------------+ PTV      Full                                                        +---------+---------------+---------+-----------+----------+--------------+ PERO     Full                                                        +---------+---------------+---------+-----------+----------+--------------+   +---------+---------------+---------+-----------+----------+--------------+ LEFT     CompressibilityPhasicitySpontaneityPropertiesThrombus Aging +---------+---------------+---------+-----------+----------+--------------+ CFV      Full           Yes      Yes                                 +---------+---------------+---------+-----------+----------+--------------+ SFJ      Full                                                        +---------+---------------+---------+-----------+----------+--------------+ FV Prox  Full                                                        +---------+---------------+---------+-----------+----------+--------------+ FV Mid   Full                                                        +---------+---------------+---------+-----------+----------+--------------+ FV DistalFull                                                        +---------+---------------+---------+-----------+----------+--------------+ PFV      Full                                                        +---------+---------------+---------+-----------+----------+--------------+  POP      Full           Yes      Yes                                 +---------+---------------+---------+-----------+----------+--------------+ PTV      Full                                                        +---------+---------------+---------+-----------+----------+--------------+ PERO     Full                                                         +---------+---------------+---------+-----------+----------+--------------+     Summary: Right: There is no evidence of deep vein thrombosis in the lower extremity. Left: There is no evidence of deep vein thrombosis in the lower extremity.  *See table(s) above for measurements and observations. Electronically signed by Gretta Began MD on 03/16/2019 at 5:39:26 PM.    Final    [place] EKG SITE RITE  Result Date: 03/06/2019 If Site Rite image not attached, placement could not be confirmed due to current cardiac rhythm.   Microbiology No results found for this or any previous visit (from the past 240 hour(s)).  Lab Basic Metabolic Panel: No results for input(s): NA, K, CL, CO2, GLUCOSE, BUN, CREATININE, CALCIUM, MG, PHOS in the last 168 hours. Liver Function Tests: No results for input(s): AST, ALT, ALKPHOS, BILITOT, PROT, ALBUMIN in the last 168 hours. No results for input(s): LIPASE, AMYLASE in the last 168 hours. No results for input(s): AMMONIA in the last 168 hours. CBC: No results for input(s): WBC, NEUTROABS, HGB, HCT, MCV, PLT in the last 168 hours. Cardiac Enzymes: No results for input(s): CKTOTAL, CKMB, CKMBINDEX, TROPONINI in the last 168 hours. Sepsis Labs: No results for input(s): PROCALCITON, WBC, LATICACIDVEN in the last 168 hours.  Procedures/Operations     Koren Bound 04/02/2019, 2:30 PM

## 2021-02-11 IMAGING — DX DG CHEST 1V PORT
1 series · 1 of 1 positions shown · non-contrast
Comparison: Radiograph 03/05/2019

CLINICAL DATA: PICC placement

EXAM:
PORTABLE CHEST 1 VIEW

[chest ap]
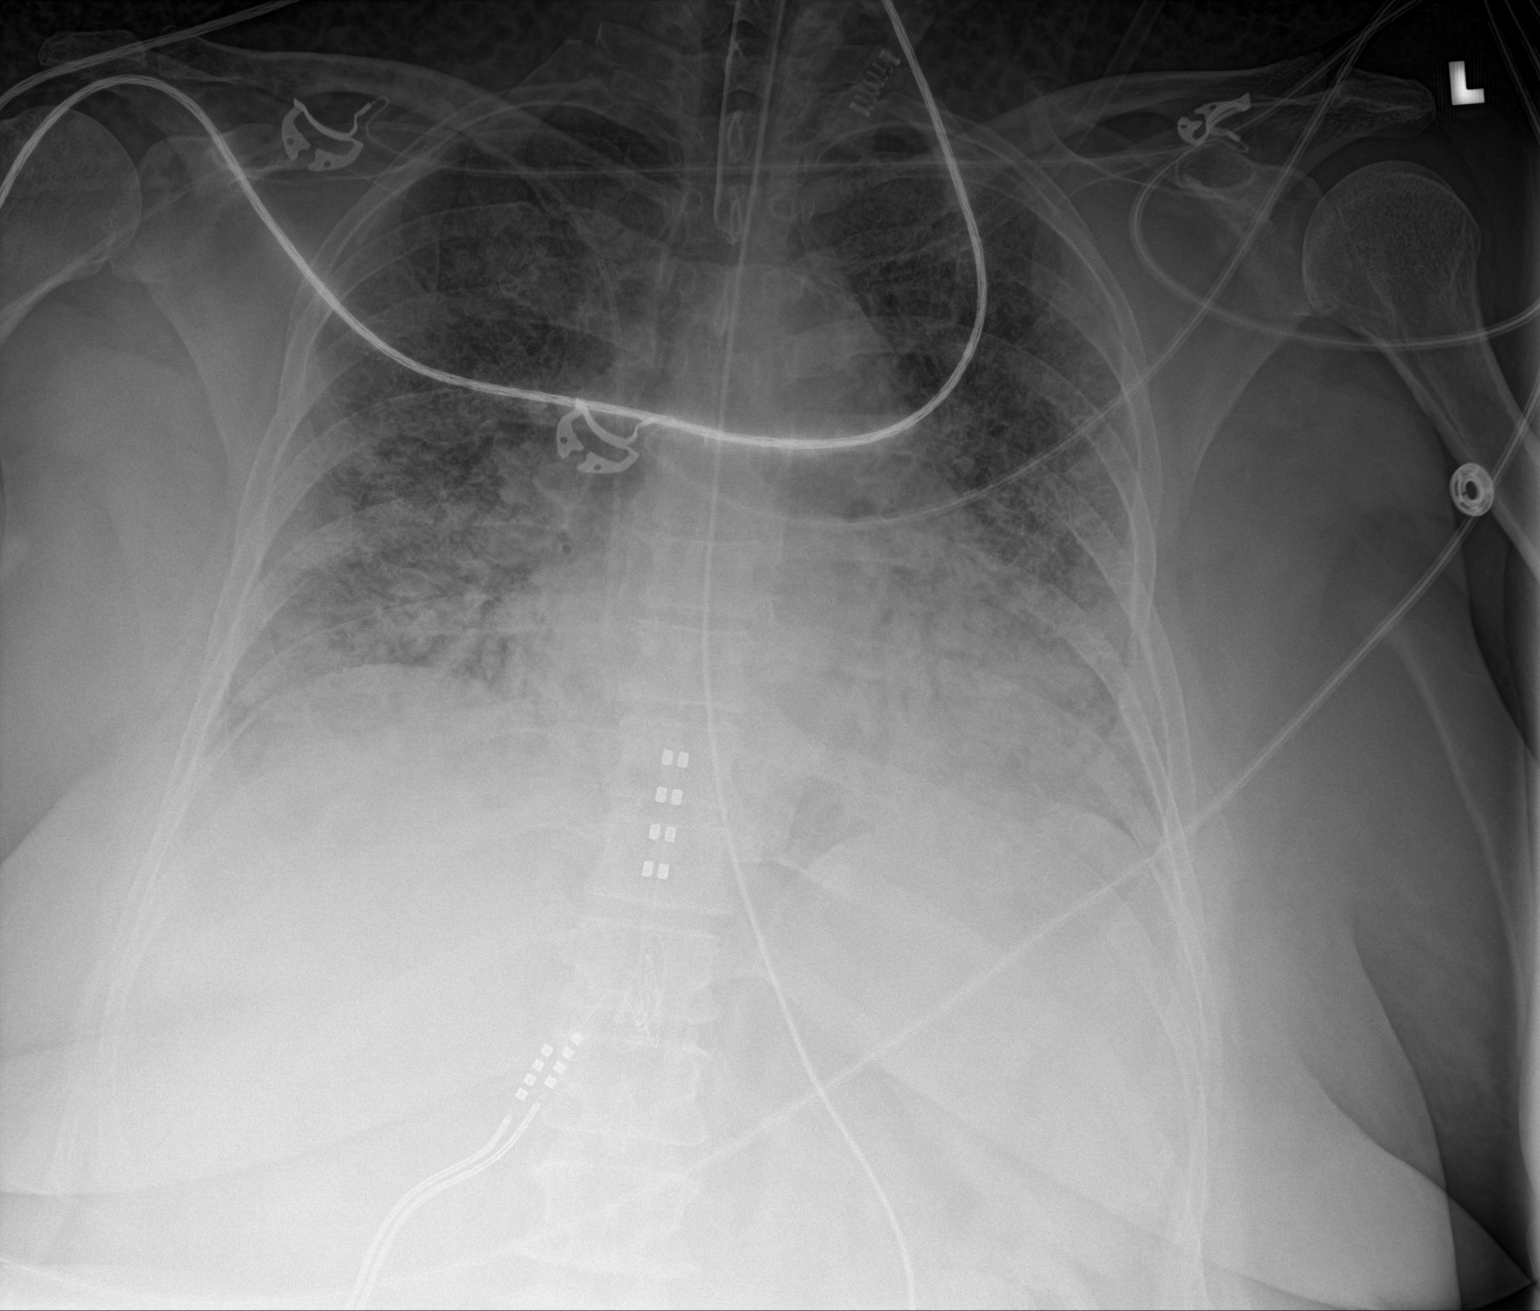

[1 of 1 positions shown; findings below may reference images not displayed]

FINDINGS: Endotracheal tube terminates in the mid trachea, 4 cm from the
carina. Transesophageal tube tip and side port distal to the GE
junction. Right upper extremity PICC with a tip at the level of the
right atrium. Spinal nerve stimulator terminates at the level of T9.

Widespread interstitial and airspace opacities throughout the lungs
are grossly similar to comparison exam. No convincing effusion or
pneumothorax. Cardiomediastinal contour similar to prior. The aorta
is calcified. The remaining cardiomediastinal contours are
unremarkable.
IMPRESSION: Satisfactory positioning of the endotracheal and transesophageal
tubes.

Satisfactory positioning of a right upper extremity PICC tip in the
right atrium/superior cavoatrial junction.

Widespread interstitial and airspace opacities throughout the lungs
are grossly similar to comparison exam.

Aortic Atherosclerosis ([id number]).

## 2021-02-13 IMAGING — DX DG CHEST 1V PORT
1 series · 1 of 1 positions shown · non-contrast
Comparison: March 07, 2019.

CLINICAL DATA: On ventilator.

EXAM:
PORTABLE CHEST 1 VIEW

[chest ap]
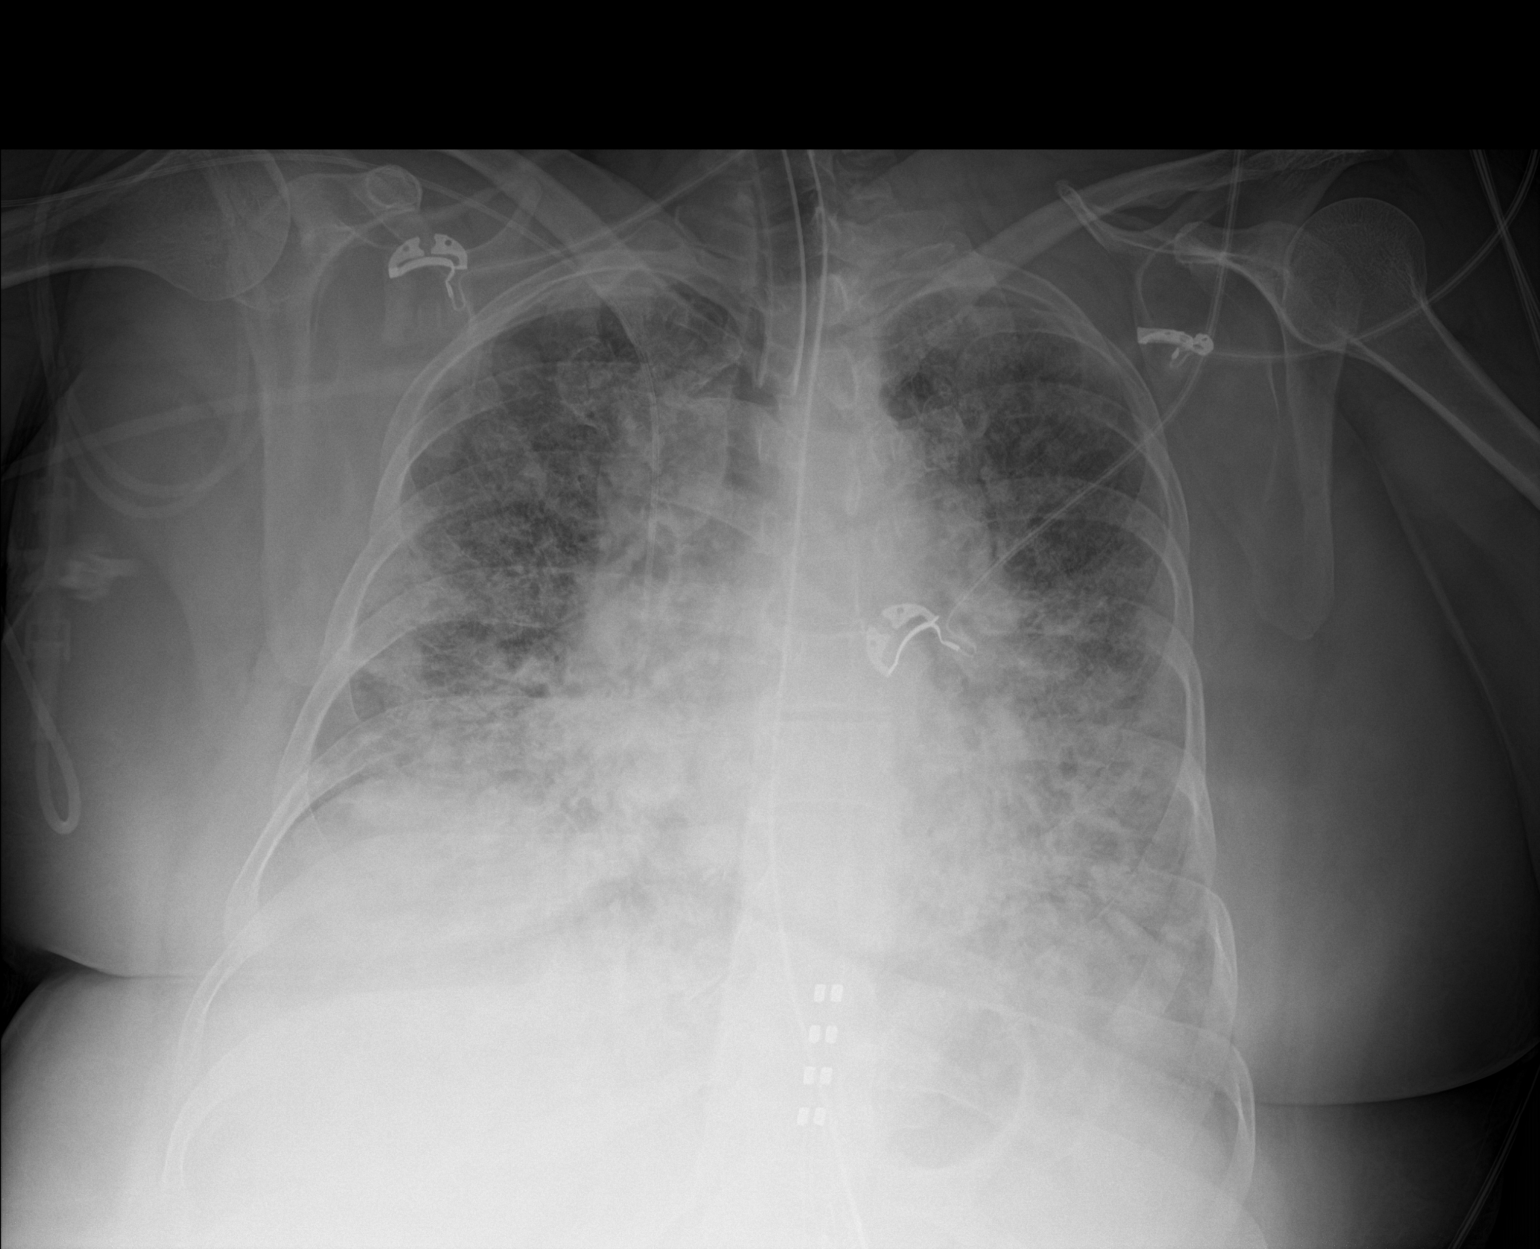

[1 of 1 positions shown; findings below may reference images not displayed]

FINDINGS: Endotracheal and nasogastric tubes are unchanged in position.
Right-sided PICC line is unchanged in position. No pneumothorax is
noted. Stable bilateral lung opacities are noted consistent with
multifocal pneumonia. Bony thorax is unremarkable.
IMPRESSION: Stable support apparatus. Stable bilateral lung opacities are noted
consistent with multifocal pneumonia.

## 2021-02-14 IMAGING — DX DG CHEST 1V PORT
1 series · 1 of 1 positions shown · non-contrast
Comparison: Radiograph yesterday, additional priors.

CLINICAL DATA: On mechanically assisted ventilation.  [id number]

EXAM:
PORTABLE CHEST 1 VIEW

[chest ap]
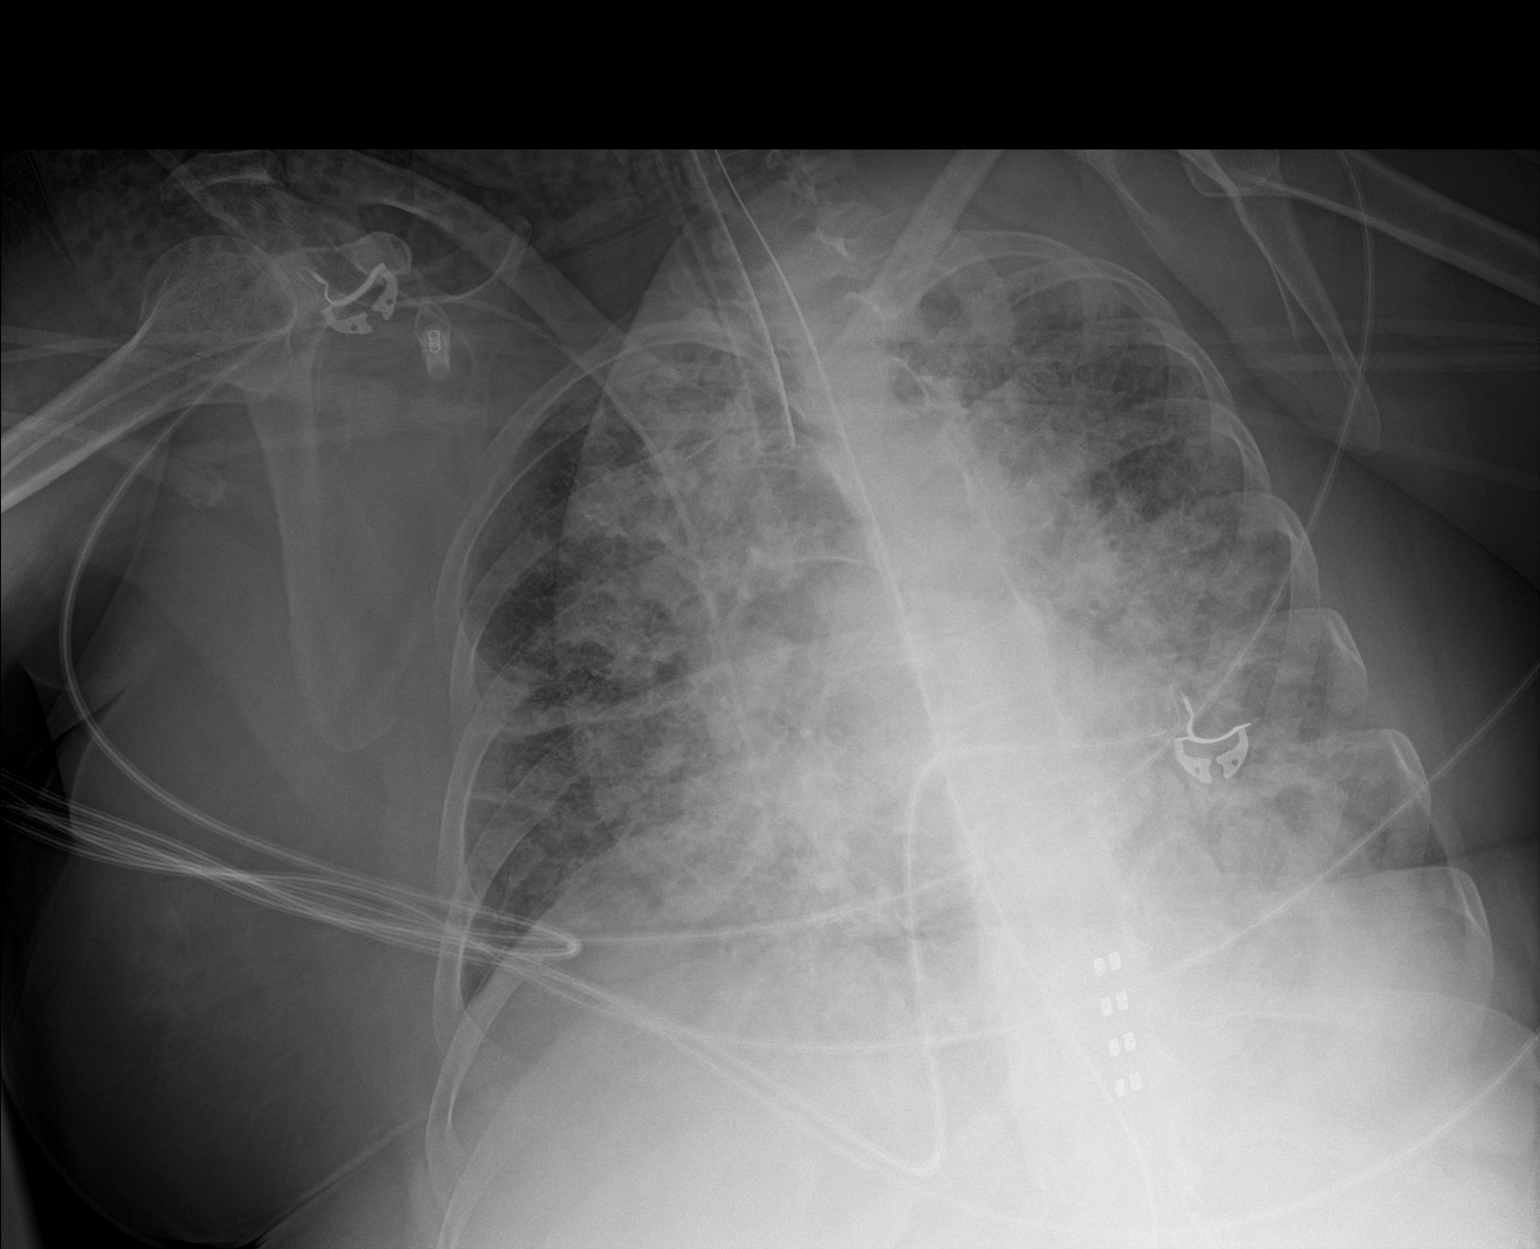

[1 of 1 positions shown; findings below may reference images not displayed]

FINDINGS: Endotracheal tube tip 3.9 cm from the carina at the thoracic inlet.
Enteric tube tip below the diaphragm not included in the field of
view. Right upper extremity PICC tip in the lower SVC. No
significant change in bilateral lung opacities. Unchanged heart size
and mediastinal contours. No pneumothorax or large pleural effusion.
Spinal stimulator partially included.
IMPRESSION: 1. No significant change in bilateral lung opacities consistent with
pneumonia.
2. Stable support apparatus.

## 2021-02-20 IMAGING — CT CT VENOGRAM HEAD
1 of 7 series · 7 of 47 positions shown · IV contrast (omnipaque)
Comparison: Head CT 03/15/2019

CLINICAL DATA: Concern for dural venous sinus thrombosis.
Left-sided posterior fossa hemorrhage.

EXAM:
CT VENOGRAM HEAD
TECHNIQUE: Multidetector CT imaging of the head was performed using the
standard protocol during bolus administration of intravenous
contrast. Multiplanar CT image reconstructions and MIPs were
obtained to evaluate the vascular anatomy.
CONTRAST:  75mL OMNIPAQUE IOHEXOL 350 MG/ML SOLN

[Series 3: head with 2.0 h30s · axial · 0.45mm/px · z∈[-181,-61]mm · 7 of 80 slices shown]
[im 10/80  brain]
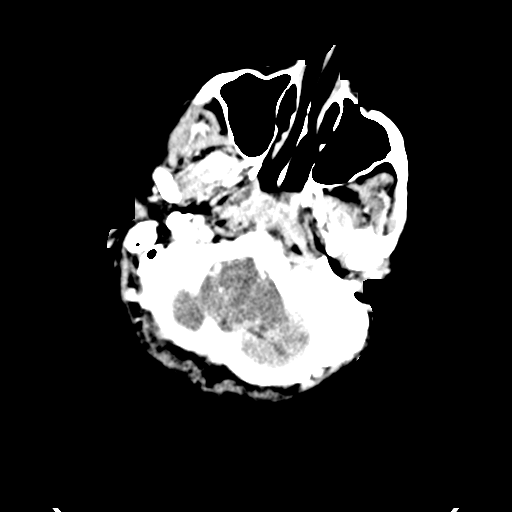
[im 20/80  bone]
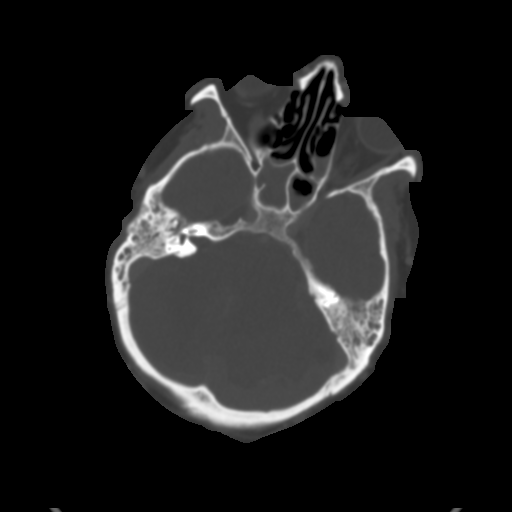
[im 30/80  brain]
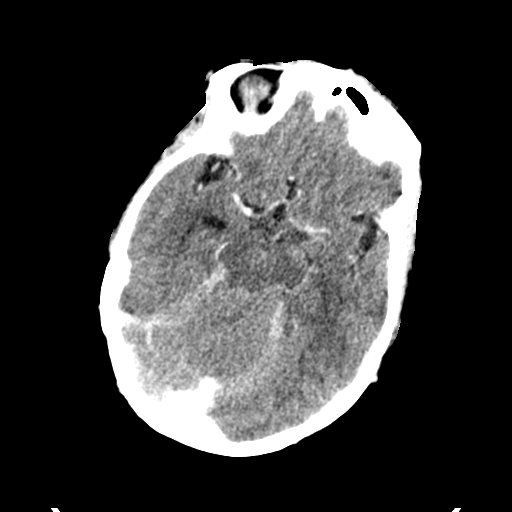
[im 40/80  bone]
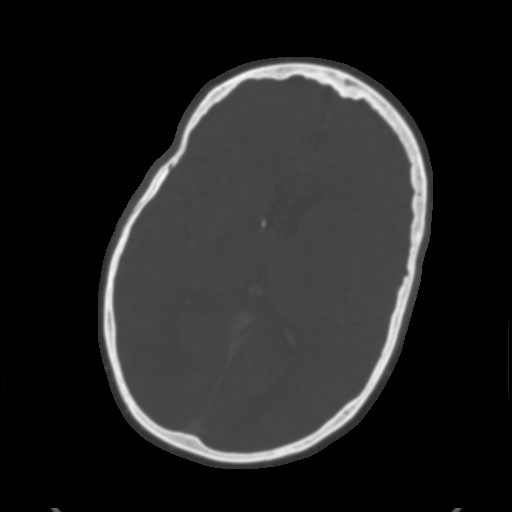
[im 50/80  brain]
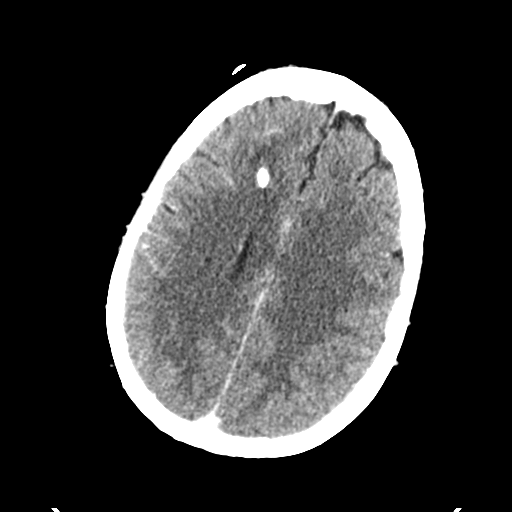
[im 60/80  bone]
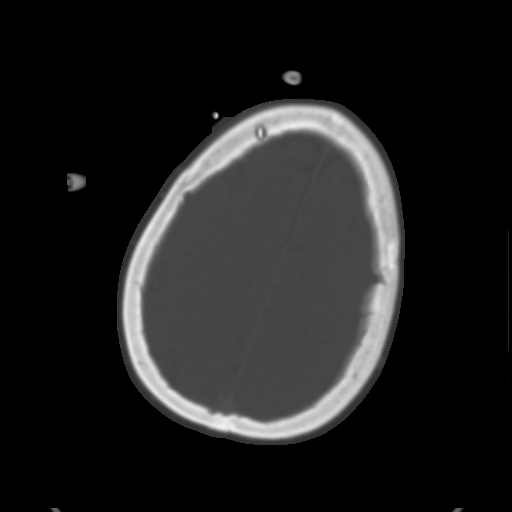
[im 70/80  brain]
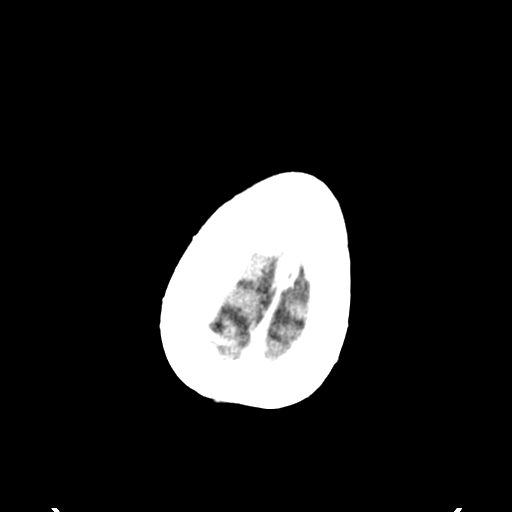

[7 of 47 positions shown; findings below may reference images not displayed]

FINDINGS: The superior sagittal sinus, internal cerebral veins, vein of Jim,
straight sinus, right transverse sinus, right sigmoid sinus, and
right jugular bulb are patent without evidence of thrombus or
significant stenosis.

The left transverse and left sigmoid sinuses are congenitally
hypoplastic, with portions of the left transverse sinus being
particularly small and poorly visualized. However, where adequately
visualized the left transverse and sigmoid sinuses as well as left
jugular bulb appear grossly patent without a discrete filling defect
identified to clearly indicate a thrombus.
IMPRESSION: Hypoplastic left transverse and left sigmoid sinuses without
convincing venous sinus thrombosis.
